# Patient Record
Sex: Female | Born: 1940
Health system: Southern US, Community
[De-identification: ages and names within clinical notes are randomized; demographics above are authoritative.]

## PROBLEM LIST (undated history)

## (undated) DIAGNOSIS — I493 Ventricular premature depolarization: Secondary | ICD-10-CM

## (undated) DIAGNOSIS — Z923 Personal history of irradiation: Secondary | ICD-10-CM

## (undated) DIAGNOSIS — R001 Bradycardia, unspecified: Secondary | ICD-10-CM

## (undated) DIAGNOSIS — H269 Unspecified cataract: Secondary | ICD-10-CM

## (undated) DIAGNOSIS — M1990 Unspecified osteoarthritis, unspecified site: Secondary | ICD-10-CM

## (undated) DIAGNOSIS — I10 Essential (primary) hypertension: Secondary | ICD-10-CM

## (undated) DIAGNOSIS — G4730 Sleep apnea, unspecified: Secondary | ICD-10-CM

## (undated) DIAGNOSIS — K219 Gastro-esophageal reflux disease without esophagitis: Secondary | ICD-10-CM

## (undated) DIAGNOSIS — K589 Irritable bowel syndrome without diarrhea: Secondary | ICD-10-CM

## (undated) DIAGNOSIS — Z8619 Personal history of other infectious and parasitic diseases: Secondary | ICD-10-CM

## (undated) DIAGNOSIS — C50919 Malignant neoplasm of unspecified site of unspecified female breast: Secondary | ICD-10-CM

## (undated) DIAGNOSIS — E785 Hyperlipidemia, unspecified: Secondary | ICD-10-CM

## (undated) HISTORY — DX: Essential (primary) hypertension: I10

## (undated) HISTORY — DX: Ventricular premature depolarization: I49.3

## (undated) HISTORY — DX: Personal history of irradiation: Z92.3

## (undated) HISTORY — PX: WISDOM TOOTH EXTRACTION: SHX21

## (undated) HISTORY — DX: Gastro-esophageal reflux disease without esophagitis: K21.9

## (undated) HISTORY — DX: Sleep apnea, unspecified: G47.30

## (undated) HISTORY — DX: Personal history of other infectious and parasitic diseases: Z86.19

## (undated) HISTORY — DX: Unspecified osteoarthritis, unspecified site: M19.90

## (undated) HISTORY — PX: KNEE ARTHROSCOPY: SHX127

## (undated) HISTORY — DX: Hyperlipidemia, unspecified: E78.5

## (undated) HISTORY — DX: Unspecified cataract: H26.9

## (undated) HISTORY — DX: Irritable bowel syndrome, unspecified: K58.9

## (undated) HISTORY — DX: Malignant neoplasm of unspecified site of unspecified female breast: C50.919

## (undated) HISTORY — DX: Bradycardia, unspecified: R00.1

## (undated) HISTORY — PX: COLONOSCOPY: SHX174

---

## 1995-10-13 HISTORY — PX: OTHER SURGICAL HISTORY: SHX169

## 1998-03-12 ENCOUNTER — Other Ambulatory Visit: Admission: RE | Admit: 1998-03-12 | Discharge: 1998-03-12 | Payer: Self-pay | Admitting: Obstetrics and Gynecology

## 2001-05-31 ENCOUNTER — Other Ambulatory Visit: Admission: RE | Admit: 2001-05-31 | Discharge: 2001-05-31 | Payer: Self-pay | Admitting: Family Medicine

## 2003-09-24 ENCOUNTER — Other Ambulatory Visit: Admission: RE | Admit: 2003-09-24 | Discharge: 2003-09-24 | Payer: Self-pay | Admitting: Family Medicine

## 2003-10-31 LAB — HM COLONOSCOPY

## 2004-11-26 ENCOUNTER — Ambulatory Visit: Payer: Self-pay | Admitting: Family Medicine

## 2004-12-14 ENCOUNTER — Ambulatory Visit (HOSPITAL_COMMUNITY): Admission: RE | Admit: 2004-12-14 | Discharge: 2004-12-14 | Payer: Self-pay | Admitting: Family Medicine

## 2004-12-17 ENCOUNTER — Ambulatory Visit: Payer: Self-pay | Admitting: Family Medicine

## 2004-12-25 ENCOUNTER — Ambulatory Visit: Payer: Self-pay | Admitting: Family Medicine

## 2005-01-28 ENCOUNTER — Ambulatory Visit: Payer: Self-pay | Admitting: Family Medicine

## 2005-03-04 ENCOUNTER — Ambulatory Visit: Payer: Self-pay | Admitting: Family Medicine

## 2005-04-29 ENCOUNTER — Ambulatory Visit: Payer: Self-pay | Admitting: Family Medicine

## 2005-06-17 ENCOUNTER — Ambulatory Visit: Payer: Self-pay | Admitting: Family Medicine

## 2005-08-17 ENCOUNTER — Ambulatory Visit: Payer: Self-pay | Admitting: Family Medicine

## 2005-12-04 ENCOUNTER — Ambulatory Visit: Payer: Self-pay | Admitting: Family Medicine

## 2006-08-30 ENCOUNTER — Encounter: Payer: Self-pay | Admitting: Family Medicine

## 2006-08-30 ENCOUNTER — Ambulatory Visit: Payer: Self-pay | Admitting: Family Medicine

## 2006-08-30 ENCOUNTER — Other Ambulatory Visit: Admission: RE | Admit: 2006-08-30 | Discharge: 2006-08-30 | Payer: Self-pay | Admitting: Family Medicine

## 2006-09-24 ENCOUNTER — Ambulatory Visit: Payer: Self-pay | Admitting: Family Medicine

## 2006-10-21 ENCOUNTER — Ambulatory Visit: Payer: Self-pay | Admitting: Family Medicine

## 2006-12-30 ENCOUNTER — Ambulatory Visit: Payer: Self-pay

## 2007-03-25 ENCOUNTER — Telehealth (INDEPENDENT_AMBULATORY_CARE_PROVIDER_SITE_OTHER): Payer: Self-pay | Admitting: *Deleted

## 2007-10-13 HISTORY — PX: CATARACT EXTRACTION: SUR2

## 2007-11-23 ENCOUNTER — Encounter: Payer: Self-pay | Admitting: Family Medicine

## 2007-11-23 DIAGNOSIS — I4949 Other premature depolarization: Secondary | ICD-10-CM | POA: Insufficient documentation

## 2007-11-23 DIAGNOSIS — K219 Gastro-esophageal reflux disease without esophagitis: Secondary | ICD-10-CM

## 2007-11-23 DIAGNOSIS — E785 Hyperlipidemia, unspecified: Secondary | ICD-10-CM

## 2007-11-23 DIAGNOSIS — K589 Irritable bowel syndrome without diarrhea: Secondary | ICD-10-CM

## 2007-11-23 DIAGNOSIS — I10 Essential (primary) hypertension: Secondary | ICD-10-CM

## 2007-12-01 ENCOUNTER — Ambulatory Visit: Payer: Self-pay | Admitting: Family Medicine

## 2007-12-01 DIAGNOSIS — E669 Obesity, unspecified: Secondary | ICD-10-CM

## 2007-12-05 LAB — CONVERTED CEMR LAB
ALT: 28 units/L (ref 0–35)
AST: 22 units/L (ref 0–37)
Albumin: 3.9 g/dL (ref 3.5–5.2)
Alkaline Phosphatase: 42 units/L (ref 39–117)
Basophils Absolute: 0 10*3/uL (ref 0.0–0.1)
Basophils Relative: 0.6 % (ref 0.0–1.0)
Bilirubin, Direct: 0.1 mg/dL (ref 0.0–0.3)
Creatinine, Ser: 1 mg/dL (ref 0.4–1.2)
Eosinophils Absolute: 0.3 10*3/uL (ref 0.0–0.6)
GFR calc Af Amer: 72 mL/min
GFR calc non Af Amer: 59 mL/min
HCT: 44 % (ref 36.0–46.0)
HDL: 34.6 mg/dL — ABNORMAL LOW (ref >39.0)
Hemoglobin: 14.1 g/dL (ref 12.0–15.0)
LDL Cholesterol: 135 mg/dL — ABNORMAL HIGH (ref 0–99)
Lymphocytes Relative: 27.5 % (ref 12.0–46.0)
Monocytes Absolute: 0.5 10*3/uL (ref 0.2–0.7)
Neutro Abs: 3.8 10*3/uL (ref 1.4–7.7)
Neutrophils Relative %: 59.6 % (ref 43.0–77.0)
Platelets: 346 10*3/uL (ref 150–400)
Potassium: 3.7 meq/L (ref 3.5–5.1)
RBC: 4.96 M/uL (ref 3.87–5.11)
TSH: 1.81 microintl units/mL (ref 0.35–5.50)
Total Bilirubin: 0.7 mg/dL (ref 0.3–1.2)
Triglycerides: 112 mg/dL (ref 0–149)
VLDL: 22 mg/dL (ref 0–40)
WBC: 6.4 10*3/uL (ref 4.5–10.5)

## 2007-12-06 ENCOUNTER — Encounter: Payer: Self-pay | Admitting: Family Medicine

## 2007-12-16 ENCOUNTER — Encounter (INDEPENDENT_AMBULATORY_CARE_PROVIDER_SITE_OTHER): Payer: Self-pay | Admitting: *Deleted

## 2007-12-20 ENCOUNTER — Ambulatory Visit: Payer: Self-pay | Admitting: Family Medicine

## 2007-12-20 LAB — FECAL OCCULT BLOOD, GUAIAC: Fecal Occult Blood: NEGATIVE

## 2007-12-21 ENCOUNTER — Encounter (INDEPENDENT_AMBULATORY_CARE_PROVIDER_SITE_OTHER): Payer: Self-pay | Admitting: *Deleted

## 2008-12-12 ENCOUNTER — Encounter: Payer: Self-pay | Admitting: Family Medicine

## 2008-12-14 ENCOUNTER — Telehealth: Payer: Self-pay | Admitting: Family Medicine

## 2008-12-17 ENCOUNTER — Encounter (INDEPENDENT_AMBULATORY_CARE_PROVIDER_SITE_OTHER): Payer: Self-pay | Admitting: *Deleted

## 2009-01-23 ENCOUNTER — Ambulatory Visit: Payer: Self-pay | Admitting: Family Medicine

## 2009-01-23 ENCOUNTER — Encounter: Payer: Self-pay | Admitting: Family Medicine

## 2009-01-23 ENCOUNTER — Other Ambulatory Visit: Admission: RE | Admit: 2009-01-23 | Discharge: 2009-01-23 | Payer: Self-pay | Admitting: Family Medicine

## 2009-01-23 DIAGNOSIS — J309 Allergic rhinitis, unspecified: Secondary | ICD-10-CM | POA: Insufficient documentation

## 2009-01-23 LAB — HM PAP SMEAR

## 2009-01-28 ENCOUNTER — Encounter: Payer: Self-pay | Admitting: Family Medicine

## 2009-02-05 ENCOUNTER — Ambulatory Visit: Payer: Self-pay | Admitting: Family Medicine

## 2009-02-07 LAB — CONVERTED CEMR LAB
AST: 23 units/L (ref 0–37)
Albumin: 3.9 g/dL (ref 3.5–5.2)
Alkaline Phosphatase: 42 units/L (ref 39–117)
Bilirubin, Direct: 0.1 mg/dL (ref 0.0–0.3)
Calcium: 9.6 mg/dL (ref 8.4–10.5)
Chloride: 105 meq/L (ref 96–112)
Creatinine, Ser: 0.9 mg/dL (ref 0.4–1.2)
Glucose, Bld: 89 mg/dL (ref 70–99)
Sodium: 143 meq/L (ref 135–145)
TSH: 2.13 microintl units/mL (ref 0.35–5.50)
Total CHOL/HDL Ratio: 5
Triglycerides: 108 mg/dL (ref 0.0–149.0)
VLDL: 21.6 mg/dL (ref 0.0–40.0)

## 2009-12-17 ENCOUNTER — Encounter: Payer: Self-pay | Admitting: Family Medicine

## 2009-12-25 ENCOUNTER — Encounter: Payer: Self-pay | Admitting: Family Medicine

## 2010-02-17 ENCOUNTER — Ambulatory Visit: Payer: Self-pay | Admitting: Family Medicine

## 2010-02-17 DIAGNOSIS — M25569 Pain in unspecified knee: Secondary | ICD-10-CM | POA: Insufficient documentation

## 2010-02-19 LAB — CONVERTED CEMR LAB
Basophils Absolute: 0 10*3/uL (ref 0.0–0.1)
Calcium: 9.3 mg/dL (ref 8.4–10.5)
HCT: 38.5 % (ref 36.0–46.0)
LDL Cholesterol: 119 mg/dL — ABNORMAL HIGH (ref 0–99)
Lymphocytes Relative: 33.8 % (ref 12.0–46.0)
MCV: 90.2 fL (ref 78.0–100.0)
Monocytes Absolute: 0.5 10*3/uL (ref 0.1–1.0)
Neutrophils Relative %: 54.3 % (ref 43.0–77.0)
Platelets: 284 10*3/uL (ref 150.0–400.0)
RBC: 4.26 M/uL (ref 3.87–5.11)
RDW: 13.6 % (ref 11.5–14.6)
Sodium: 140 meq/L (ref 135–145)
TSH: 1.16 microintl units/mL (ref 0.35–5.50)
Total CHOL/HDL Ratio: 4
VLDL: 29.8 mg/dL (ref 0.0–40.0)
WBC: 7.2 10*3/uL (ref 4.5–10.5)

## 2010-05-06 ENCOUNTER — Ambulatory Visit: Payer: Self-pay | Admitting: Family Medicine

## 2010-05-06 DIAGNOSIS — R1032 Left lower quadrant pain: Secondary | ICD-10-CM

## 2010-05-14 ENCOUNTER — Ambulatory Visit (HOSPITAL_BASED_OUTPATIENT_CLINIC_OR_DEPARTMENT_OTHER): Admission: RE | Admit: 2010-05-14 | Discharge: 2010-05-14 | Payer: Self-pay | Admitting: Orthopedic Surgery

## 2010-11-11 NOTE — Assessment & Plan Note (Signed)
Summary: CPX/DLO   Vital Signs:  Patient profile:   70 year old female Height:      66 inches Weight:      242.25 pounds BMI:     39.24 Temp:     97.5 degrees F oral Pulse rate:   68 / minute Pulse rhythm:   regular BP sitting:   122 / 68  (left arm) Cuff size:   regular  Vitals Entered By: Lewanda Rife LPN (Feb 17, 7845 2:36 PM) CC: CPX with breast exam LMP 1990   History of Present Illness: here for f/u of chronic med problems and to rev health mt list  feels good in general   has a painful knee  no arthritis  getting mri -- to see if any meniscal injury   wt is up 1 lb with bmi 39 is trying to eat healthier -- but no exercise in 2 mo due to knee  is going to try a bicycle    lipids due - LDL one teens last year, is on fish oil ate tuna for lunch today- wants to do labs   HTN in good control with bp 122/68  mam nl 3/11 self exam --no lumps or changes  fam hx b ca in mother  pap nl in 4/10  no gyn problems   colonosc nl in 05- due 10y no bowel changes at all   Td 03  had shingles and also vaccine   no pneumonia vaccine   dexa nl in 05 -- pt does not want to have another one  no lost ht   ca and D - very good about that and gets outdoors quite a bit   Allergies: 1)  Zestril  Past History:  Past Surgical History: Last updated: 01/23/2009 Left breast cyst (1995) Shingles left forehead and cornea Colonoscopy- neg (10/2003) Dexa- normal (01/2004) MRI/MRA no  aneurysm, some atherosclerosis post cereb artery (12/2004) Carotid dopplers 0-39 % ICA stenosis (12/2006) cataract surg bilat09   Family History: Last updated: Dec 07, 2007 Father: died age 45- cerebral hemorrhage- aneurysm Mother: died age 45- HTN, CAD, breast cancer Siblings: 2 brothers with HTN, 1 sister GM DM GM OP neice aneurysm in brain  brother subdural hematoma after a fall, and heart problems (? died of )   Social History: Last updated: 11/23/2007 Marital Status:  Married Children: 1 Occupation: retired  Risk Factors: Smoking Status: never (11/23/2007)  Past Medical History: GERD IBS hx of PVCs past hx of shingles  Hyperlipidemia Hypertension   derm- Dr Londell Moh  ortho - Dr Despina Hick   Review of Systems General:  Denies fatigue, fever, loss of appetite, and malaise. Eyes:  Denies blurring and eye irritation. CV:  Denies chest pain or discomfort, palpitations, shortness of breath with exertion, and swelling of feet. Resp:  Denies cough, shortness of breath, and wheezing. GI:  Denies abdominal pain, change in bowel habits, and indigestion. GU:  Denies abnormal vaginal bleeding, discharge, dysuria, hematuria, and urinary frequency. MS:  Complains of joint pain and stiffness; denies joint redness, joint swelling, and cramps. Derm:  Denies itching, lesion(s), poor wound healing, and rash. Neuro:  Denies numbness and tingling. Psych:  Denies anxiety and depression. Endo:  Denies cold intolerance, excessive thirst, excessive urination, and heat intolerance. Heme:  Denies abnormal bruising and bleeding.  Physical Exam  General:  obese and well appearing  Head:  normocephalic, atraumatic, and no abnormalities observed.   Eyes:  vision grossly intact, pupils equal, pupils round, and pupils reactive  to light.  no conjunctival pallor, injection or icterus  Ears:  R ear normal and L ear normal.  - scant cerumen Nose:  no nasal discharge.   Mouth:  pharynx pink and moist.   Neck:  supple with full rom and no masses or thyromegally, no JVD or carotid bruit  Chest Wall:  No deformities, masses, or tenderness noted. Breasts:  No mass, nodules, thickening, tenderness, bulging, retraction, inflamation, nipple discharge or skin changes noted.   Lungs:  Normal respiratory effort, chest expands symmetrically. Lungs are clear to auscultation, no crackles or wheezes. Heart:  Normal rate and regular rhythm. S1 and S2 normal without gallop, murmur, click, rub  or other extra sounds. Abdomen:  Bowel sounds positive,abdomen soft and non-tender without masses, organomegaly or hernias noted. no renal bruits Msk:  No deformity or scoliosis noted of thoracic or lumbar spine.   poor rom knee without crepitice Pulses:  R and L carotid,radial,femoral,dorsalis pedis and posterior tibial pulses are full and equal bilaterally Extremities:  No clubbing, cyanosis, edema, or deformity noted with normal full range of motion of all joints.   Neurologic:  sensation intact to light touch, gait normal, and DTRs symmetrical and normal.   Skin:  Intact without suspicious lesions or rashes lentigos and sks diffusely  Cervical Nodes:  No lymphadenopathy noted Axillary Nodes:  No palpable lymphadenopathy Inguinal Nodes:  No significant adenopathy Psych:  normal affect, talkative and pleasant -- cheerful   Impression & Recommendations:  Problem # 1:  NEOPLASM, MALIGNANT, BREAST, FAMILY HX, MOTHER (ICD-V16.3) Assessment Unchanged mam is up to date and normal  breast exam done today urged pt to keep up her self breast exams   Problem # 2:  HYPERTENSION (ICD-401.9) Assessment: Unchanged  bp remains in very good control with current meds lab today rev low sodium diet  want to work on low impact exercise  Her updated medication list for this problem includes:    Hydrochlorothiazide 25 Mg Tabs (Hydrochlorothiazide) .Marland Kitchen... Take one by mouth daily    Benicar 20 Mg Tabs (Olmesartan medoxomil) .Marland Kitchen... Take one by mouth daily  Orders: Venipuncture (16109) TLB-Lipid Panel (80061-LIPID) TLB-Renal Function Panel (80069-RENAL) TLB-CBC Platelet - w/Differential (85025-CBCD) TLB-TSH (Thyroid Stimulating Hormone) (84443-TSH) TLB-ALT (SGPT) (84460-ALT) TLB-AST (SGOT) (84450-SGOT)  BP today: 122/68 Prior BP: 118/70 (01/23/2009)  Labs Reviewed: K+: 3.9 (02/05/2009) Creat: : 0.9 (02/05/2009)   Chol: 179 (02/05/2009)   HDL: 38.80 (02/05/2009)   LDL: 119 (02/05/2009)   TG:  108.0 (02/05/2009)  Problem # 3:  HYPERLIPIDEMIA (ICD-272.4) check labs today rev low sat fat diet  has controlled with diet in the past Orders: Venipuncture (60454) TLB-Lipid Panel (80061-LIPID) TLB-Renal Function Panel (80069-RENAL) TLB-CBC Platelet - w/Differential (85025-CBCD) TLB-TSH (Thyroid Stimulating Hormone) (84443-TSH) TLB-ALT (SGPT) (84460-ALT) TLB-AST (SGOT) (84450-SGOT)  Problem # 4:  KNEE PAIN, ACUTE (ICD-719.46) Assessment: Comment Only with no OA seen  will f/u with Dr Despina Hick for MRI- ? meniscal injury  Her updated medication list for this problem includes:    Adult Aspirin Low Strength 81 Mg Tbdp (Aspirin) .Marland Kitchen... Take one by mouth daily  Problem # 5:  Preventive Health Care (ICD-V70.0) Assessment: Comment Only reviewed health mt list  pneumovax today  Complete Medication List: 1)  Hydrochlorothiazide 25 Mg Tabs (Hydrochlorothiazide) .... Take one by mouth daily 2)  Calcium 1500 Mg Tabs (Calcium carbonate) .... Take by mouth as directed 3)  Restasis 0.05 % Emul (Cyclosporine) .... Use gtts as directed daily 4)  Benicar 20 Mg Tabs (Olmesartan medoxomil) .Marland KitchenMarland KitchenMarland Kitchen  Take one by mouth daily 5)  Adult Aspirin Low Strength 81 Mg Tbdp (Aspirin) .... Take one by mouth daily 6)  Prilosec 20 Mg Cpdr (Omeprazole) .... Take one by mouth daily as needed 7)  Allegra 60 Mg Tabs (Fexofenadine hcl) .Marland Kitchen.. 1 by mouth two times a day prn 8)  Fish Oil Oil (Fish oil) .... 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, commercial) [id number])  Patient Instructions: 1)  labs today 2)  keep working on healthy diet and exercise for weight loss 3)  you can raise your HDL (good cholesterol) by increasing exercise and eating omega 3 fatty acid supplement like fish oil or flax seed oil over the counter 4)  you can lower LDL (bad cholesterol) by limiting saturated fats in diet like red meat, fried foods, egg yolks, fatty breakfast  meats, high fat dairy products and shellfish  5)  pneumonia vaccine today  Current Allergies (reviewed today): ZESTRIL    Pneumovax Vaccine    Vaccine Type: Pneumovax    Site: left deltoid    Mfr: Merck    Dose: 0.5 ml    Route: IM    Given by: Lewanda Rife LPN    Exp. Date: 08/06/2011    Lot #: [id number]    VIS given: 05/09/96 version given Feb 17, 2010.

## 2010-11-11 NOTE — Assessment & Plan Note (Signed)
Summary: stomach cramping/dlol   Vital Signs:  Patient profile:   70 year old female Height:      66 inches Weight:      235.50 pounds BMI:     38.15 Temp:     98.3 degrees F oral Pulse rate:   68 / minute Pulse rhythm:   regular BP sitting:   120 / 68  (left arm) Cuff size:   large  Vitals Entered By: Lewanda Rife LPN (May 06, 2010 3:22 PM) CC: Since 04/15/10 stomach cramping and indigestion that comes and goes.   History of Present Illness: symptoms started on july 1st- was at the beach had eaten tomato sandwhich and healthy choice cereal with nuts  that night ate salmon and salad , then strawberry ice cream  all the sudden stomach gave her sharp pains - rad across her back  epigastric and Lower left quadrant (? of diverticulosis)  next day spent in bed next day used maalox and zantac -- and that really helped   since then has avoided seeds and nuts   yesterday - went out for a birthday got a steak and cheese sub-- got indigestion like heartburn with pain across back much milder  did eat more than usual  stopped her prilosec -- 6 months ago - was doing well  last night she did take some maalox  has not had any zantac or prilosec   feels ok now   last colonosc was in 05  no hx of polyps  no ca in family       Allergies: 1)  Zestril  Past History:  Past Medical History: Last updated: 02/17/2010 GERD IBS hx of PVCs past hx of shingles  Hyperlipidemia Hypertension   derm- Dr Londell Moh  ortho - Dr Despina Hick   Past Surgical History: Last updated: 01/23/2009 Left breast cyst (1995) Shingles left forehead and cornea Colonoscopy- neg (10/2003) Dexa- normal (01/2004) MRI/MRA no  aneurysm, some atherosclerosis post cereb artery (12/2004) Carotid dopplers 0-39 % ICA stenosis (12/2006) cataract surg bilat09   Family History: Last updated: 2007-12-08 Father: died age 34- cerebral hemorrhage- aneurysm Mother: died age 61- HTN, CAD, breast  cancer Siblings: 2 brothers with HTN, 1 sister GM DM GM OP neice aneurysm in brain  brother subdural hematoma after a fall, and heart problems (? died of )   Social History: Last updated: 11/23/2007 Marital Status: Married Children: 1 Occupation: retired  Risk Factors: Smoking Status: never (11/23/2007)  Review of Systems General:  Denies chills, fatigue, fever, loss of appetite, and malaise. Eyes:  Denies blurring and eye irritation. CV:  Denies chest pain or discomfort, palpitations, and shortness of breath with exertion. Resp:  Denies cough and wheezing. GI:  Complains of abdominal pain and indigestion; denies bloody stools, change in bowel habits, vomiting, and vomiting blood. GU:  Denies dysuria and urinary frequency. Derm:  Denies lesion(s), poor wound healing, and rash. Neuro:  Denies numbness and tingling. Psych:  mood is ok . Endo:  Denies cold intolerance, excessive thirst, excessive urination, and heat intolerance. Heme:  Denies abnormal bruising and bleeding.  Physical Exam  General:  obese and well appearing  Head:  normocephalic, atraumatic, and no abnormalities observed.   Eyes:  vision grossly intact, pupils equal, pupils round, and pupils reactive to light.  no conjunctival pallor, injection or icterus  Mouth:  pharynx pink and moist.   Neck:  supple with full rom and no masses or thyromegally, no JVD or carotid bruit  Lungs:  Normal respiratory effort, chest expands symmetrically. Lungs are clear to auscultation, no crackles or wheezes. Heart:  Normal rate and regular rhythm. S1 and S2 normal without gallop, murmur, click, rub or other extra sounds. Abdomen:  mildly tender in epigastrium and LLQ without rebound or gaurding no R sided tenderness neg murphy sign  soft, normal bowel sounds, no distention, no masses, no hepatomegaly, and no splenomegaly.   Extremities:  No clubbing, cyanosis, edema, or deformity noted with normal full range of motion of all  joints.   Neurologic:  sensation intact to light touch, gait normal, and DTRs symmetrical and normal.   Skin:  Intact without suspicious lesions or rashes no pallor or jaundice  Cervical Nodes:  No lymphadenopathy noted Inguinal Nodes:  No significant adenopathy Psych:  normal affect, talkative and pleasant    Impression & Recommendations:  Problem # 1:  GERD (ICD-530.81) Assessment Deteriorated with what sounds like an episode of gastritis -- has been off ppi for 6 mo suspec this is acid problem - since it responded briefly to maalox given px for omeprazole - to update in 1 week if not resolved disc lifestyle change for reflux  if not imp consider lab and poss abd [place] adv if symptoms severe- seek care in ER  The following medications were removed from the medication list:    Prilosec 20 Mg Cpdr (Omeprazole) .Marland Kitchen... Take one by mouth daily as needed Her updated medication list for this problem includes:    Omeprazole 20 Mg Cpdr (Omeprazole) .Marland Kitchen... 1 by mouth once daily in ams  Problem # 2:  ABDOMINAL PAIN, LEFT LOWER QUADRANT (ICD-789.04) Assessment: New this was brief after eating cereal with nuts  suspect poss diverticulosis  no pain or fever now  disc starting daily fiber and watch diet for nuts and seeds  update if symptoms return last colonosc 05 normal  Complete Medication List: 1)  Hydrochlorothiazide 25 Mg Tabs (Hydrochlorothiazide) .... Take one by mouth daily 2)  Restasis 0.05 % Emul (Cyclosporine) .... Use gtts as directed daily 3)  Benicar 20 Mg Tabs (Olmesartan medoxomil) .... Take one by mouth daily 4)  Adult Aspirin Low Strength 81 Mg Tbdp (Aspirin) .... Take one by mouth daily 5)  Allegra 60 Mg Tabs (Fexofenadine hcl) .Marland Kitchen.. 1 by mouth two times a day prn 6)  Fish Oil Oil (Fish oil) .... Take two by mouth daily 7)  Calcium Carbonate-vitamin D 600-400 Mg-unit Tabs (Calcium carbonate-vitamin d) .... One tab by mouth twice a day 8)  Omeprazole 20 Mg Cpdr (Omeprazole)  .Marland Kitchen.. 1 by mouth once daily in ams  Patient Instructions: 1)  start on omeprazole (generic prilosec) 20 mg each am  2)  start first dose now, however 3)  start citrucel fiber supplement mixed with water once daily  4)  if your symptoms are not resolved in 1 week - call  5)  that includes abdominal pain/ back pain or heartburn 6)  stay away from heavy / fatty foods/spicy foods / caffiene and don't eat late 7)  also eat small portions 8)  please update me if symptoms worsen  Prescriptions: OMEPRAZOLE 20 MG CPDR (OMEPRAZOLE) 1 by mouth once daily in ams  #30 x 11   Entered and Authorized by:   Judith Part MD   Signed by:   Judith Part MD on 05/06/2010   Method used:   Electronically to        [facility]* (retail)       [id number]       [place], [place]  [address]       Ph: [phone]       Fax: [phone]   RxID:   [id number]   Current Allergies (reviewed today): ZESTRIL

## 2010-11-11 NOTE — Letter (Signed)
Summary: Results Follow up Letter  Miranda Mueller at [facility]  [address] [place], [place] [address]   Phone: [phone]  Fax: [phone]    12/25/2009 MRN: [id number]  [facility] [address] [place], [place]  [address]  Dear Miranda Mueller,  The following are the results of your recent test(s):  Test         Result    Pap Smear:        Normal _____  Not Normal _____ Comments: ______________________________________________________ Cholesterol: LDL(Bad cholesterol):         Your goal is less than:         HDL (Good cholesterol):       Your goal is more than: Comments:  ______________________________________________________ Mammogram:        Normal __X___  Not Normal _____ Comments: Please repeat in one year.  ___________________________________________________________________ Hemoccult:        Normal _____  Not normal _______ Comments:    _____________________________________________________________________ Other Tests:    We routinely do not discuss normal results over the telephone.  If you desire a copy of the results, or you have any questions about this information we can discuss them at your next office visit.   Sincerely,       Roxy Manns, MD

## 2010-12-17 ENCOUNTER — Encounter: Payer: Self-pay | Admitting: Family Medicine

## 2010-12-17 LAB — HM MAMMOGRAPHY: HM Mammogram: NORMAL

## 2010-12-22 ENCOUNTER — Encounter (INDEPENDENT_AMBULATORY_CARE_PROVIDER_SITE_OTHER): Payer: Self-pay | Admitting: *Deleted

## 2010-12-26 LAB — POCT I-STAT 4, (NA,K, GLUC, HGB,HCT)
Glucose, Bld: 103 mg/dL — ABNORMAL HIGH (ref 70–99)
HCT: 40 % (ref 36.0–46.0)
Hemoglobin: 13.6 g/dL (ref 12.0–15.0)
Potassium: 3.7 mEq/L (ref 3.5–5.1)
Sodium: 141 mEq/L (ref 135–145)

## 2010-12-30 NOTE — Miscellaneous (Signed)
Summary: Mammogram to flowsheet  Clinical Lists Changes  Observations: Added new observation of MAMMO DUE: 12/2011 (12/22/2010 13:32) Added new observation of MAMMOGRAM: normal (12/17/2010 13:32)      Preventive Care Screening  Mammogram:    Date:  12/17/2010    Next Due:  12/2011    Results:  normal

## 2010-12-30 NOTE — Letter (Signed)
Summary: Results Follow up Letter  [place] at [facility]  [address] [place], [place] [address]   Phone: [phone]  Fax: [phone]    12/22/2010 MRN: [id number]     Miranda Mueller [address] [place], [place]  [address]       Dear Miranda Mueller,  The following are the results of your recent test(s):  Test         Result    Pap Smear:        Normal _____  Not Normal _____ Comments: ______________________________________________________ Cholesterol: LDL(Bad cholesterol):         Your goal is less than:         HDL (Good cholesterol):       Your goal is more than: Comments:  ______________________________________________________ Mammogram:        Normal __X___  Not Normal _____ Comments: Repeat in 1 year  ___________________________________________________________________ Hemoccult:        Normal _____  Not normal _______ Comments:    _____________________________________________________________________ Other Tests:    We routinely do not discuss normal results over the telephone.  If you desire a copy of the results, or you have any questions about this information we can discuss them at your next office visit.   Sincerely,      Sharilyn Sites for Dr. Roxy Manns

## 2011-07-01 ENCOUNTER — Other Ambulatory Visit: Payer: Self-pay | Admitting: Family Medicine

## 2011-07-01 NOTE — Telephone Encounter (Signed)
[facility] electronically request refill Benicar 20 mg and HCTZ 25 mg. #90 each x 0 refill with note pt needs to call for appt.

## 2011-09-28 ENCOUNTER — Other Ambulatory Visit: Payer: Self-pay | Admitting: Family Medicine

## 2011-10-02 NOTE — Telephone Encounter (Signed)
Refill request from [facility] for Benicar 20 mg and HCTZ 25 mg. Both last filled 07/03/11. It looks like pt last seen 04/2610 and CPX on 02/17/10 in [facility]. No  Upcoming appt scheduled at this time.Please advise.

## 2011-10-04 NOTE — Telephone Encounter (Signed)
Please call her and make a f/u appt this winter  Then send this back to me and I will refil  Thanks

## 2011-10-05 NOTE — Telephone Encounter (Signed)
Patient notified as instructed by telephone. Pt scheduled f/u 10/19/10 at 2pm and will schedule CPX when comes in for 01/08 appt.

## 2011-10-05 NOTE — Telephone Encounter (Signed)
Will refill electronically  

## 2011-10-20 ENCOUNTER — Ambulatory Visit (INDEPENDENT_AMBULATORY_CARE_PROVIDER_SITE_OTHER): Payer: Medicare Other | Admitting: Family Medicine

## 2011-10-20 ENCOUNTER — Encounter: Payer: Self-pay | Admitting: Family Medicine

## 2011-10-20 VITALS — BP 128/74 | HR 64 | Temp 98.2°F | Ht 66.0 in | Wt 253.2 lb

## 2011-10-20 DIAGNOSIS — I10 Essential (primary) hypertension: Secondary | ICD-10-CM | POA: Diagnosis not present

## 2011-10-20 DIAGNOSIS — E785 Hyperlipidemia, unspecified: Secondary | ICD-10-CM | POA: Diagnosis not present

## 2011-10-20 DIAGNOSIS — K219 Gastro-esophageal reflux disease without esophagitis: Secondary | ICD-10-CM

## 2011-10-20 LAB — COMPREHENSIVE METABOLIC PANEL
Alkaline Phosphatase: 43 U/L (ref 39–117)
Calcium: 9.4 mg/dL (ref 8.4–10.5)
Chloride: 103 mEq/L (ref 96–112)

## 2011-10-20 LAB — LIPID PANEL
Total CHOL/HDL Ratio: 5
Triglycerides: 138 mg/dL (ref 0.0–149.0)
VLDL: 27.6 mg/dL (ref 0.0–40.0)

## 2011-10-20 MED ORDER — OLMESARTAN MEDOXOMIL 20 MG PO TABS: 20.0000 mg | ORAL_TABLET | Freq: Every day | ORAL | Status: DC

## 2011-10-20 MED ORDER — HYDROCHLOROTHIAZIDE 25 MG PO TABS: 25.0000 mg | ORAL_TABLET | Freq: Every day | ORAL | Status: DC

## 2011-10-20 MED ORDER — OMEPRAZOLE 20 MG PO CPDR: 20.0000 mg | DELAYED_RELEASE_CAPSULE | Freq: Every day | ORAL | Status: DC

## 2011-10-20 NOTE — Progress Notes (Signed)
Subjective:    Patient ID: Miranda Mueller, female    DOB: Feb 11, 1941, 71 y.o.   MRN: [id number]  HPI Here for f/u of HTN and lipids and GERd Is feeling good   Has had quite a bit of weight gain - no new meds that cause wt gain  Exercises 3-4 days per week 45-1 hour-- pretty good  Eating may have changed -- healthier foods but perhaps portions are bigger Does eat a lot of grilled chicken salad   - 1 tbsp of ranch dressing  Does drink some sweet tea - had previously cut out  bp is   128/74  Today Very good control on benicar and hct  Wt is up 18 lb with bmi of 40 No cp or palpitations or headaches or edema  No side effects to medicines   Tried wt watchers in past -- would consider going back to that     Chemistry      Component Value Date/Time   NA 141 05/14/2010 0938   K 3.7 05/14/2010 0938   CL 101 02/17/2010 1454   CO2 29 02/17/2010 1454   BUN 14 02/17/2010 1454   CREATININE 1.1 02/17/2010 1454      Component Value Date/Time   CALCIUM 9.3 02/17/2010 1454   ALKPHOS 42 02/05/2009 0902   AST 19 02/17/2010 1454   ALT 22 02/17/2010 1454   BILITOT 1.0 02/05/2009 0902      Hx of hyperlipidemia  Lab Results  Component Value Date   CHOL 193 02/17/2010   HDL 44.10 02/17/2010   LDLCALC 119* 02/17/2010   TRIG 149.0 02/17/2010   CHOLHDL 4 02/17/2010   not too bad at that time  Diet-- no fatty foods except for the ranch dressing  Exercise- is good   Takes omeprazole for gerd  Symptoms are in good control when she is on it  Ran out - and tried zantac -- and that is not as good - breakthru symptoms- heartburn and acid in mouth  Patient Active Problem List  Diagnoses  . HYPERLIPIDEMIA  . OBESITY  . HYPERTENSION  . PREMATURE VENTRICULAR CONTRACTIONS  . ALLERGIC RHINITIS  . GERD  . IBS  . KNEE PAIN, ACUTE  . ABDOMINAL PAIN, LEFT LOWER QUADRANT   Past Medical History  Diagnosis Date  . GERD (gastroesophageal reflux disease)   . IBS (irritable bowel syndrome)   . PVC (premature ventricular  contraction)     Hx of  . History of shingles   . Hyperlipidemia   . Hypertension    Past Surgical History  Procedure Date  . Cataract extraction 2009    bilateral  . Breast surgery     ? left breast cyst   History  Substance Use Topics  . Smoking status: Never Smoker   . Smokeless tobacco: Not on file  . Alcohol Use: Not on file   Family History  Problem Relation Age of Onset  . Hypertension Mother   . Heart disease Mother     CAD  . Cancer Mother     breast CA  . Hypertension Sister   . Hypertension Brother   . Hypertension Brother    Allergies  Allergen Reactions  . Lisinopril     REACTION: cough   Current Outpatient Prescriptions on File Prior to Visit  Medication Sig Dispense Refill  . BENICAR 20 MG tablet TAKE 1 TABLET DAILY (NEED APPOINTMENT)  90 tablet  0  . hydrochlorothiazide (HYDRODIURIL) 25 MG tablet TAKE 1 TABLET  DAILY (NEED APPOINTMENT)  90 tablet  0        Review of Systems Review of Systems  Constitutional: Negative for fever, appetite change, fatigue and unexpected weight change.  Eyes: Negative for pain and visual disturbance.  Respiratory: Negative for sob, pos for cough occ with clear phlegm Cardiovascular: Negative for cp or palpitations    Gastrointestinal: Negative for nausea, diarrhea and constipation pos for heartburn/ indigestion/ throat clearing .  Genitourinary: Negative for urgency and frequency.  Skin: Negative for pallor or rash   Neurological: Negative for weakness, light-headedness, numbness and headaches.  Hematological: Negative for adenopathy. Does not bruise/bleed easily.  Psychiatric/Behavioral: Negative for dysphoric mood. The patient is not nervous/anxious.          Objective:   Physical Exam  Constitutional: She appears well-developed and well-nourished.       Obese and well appearing    HENT:  Head: Normocephalic and atraumatic.  Mouth/Throat: Oropharynx is clear and moist.  Eyes: Conjunctivae and EOM are  normal. Pupils are equal, round, and reactive to light. No scleral icterus.  Neck: Normal range of motion. Neck supple. No JVD present. Carotid bruit is not present. No thyromegaly present.  Cardiovascular: Normal rate, regular rhythm, normal heart sounds and intact distal pulses.  Exam reveals no gallop.   Pulmonary/Chest: Effort normal and breath sounds normal. No respiratory distress. She has no wheezes.  Abdominal: Soft. Bowel sounds are normal. She exhibits no distension, no abdominal bruit and no mass. There is no tenderness.  Musculoskeletal: Normal range of motion. She exhibits no edema and no tenderness.  Lymphadenopathy:    She has no cervical adenopathy.  Neurological: She is alert. She has normal reflexes. No cranial nerve deficit. She exhibits normal muscle tone. Coordination normal.  Skin: Skin is warm and dry. No rash noted. No pallor.  Psychiatric: She has a normal mood and affect.          Assessment & Plan:

## 2011-10-20 NOTE — Assessment & Plan Note (Signed)
Despite wt gain- in good control with benicar and hct Lab today  Rev plan for healthy diet and exercise F/u 6 mo for PE

## 2011-10-20 NOTE — Patient Instructions (Signed)
Work on weight loss Change to low cal salad dressing and also quit sugar beverages of any kind Increase exercise to 5 days per week if you can  I sent your px to [facility] today  Schedule PE with lab prior in about 6 months

## 2011-10-20 NOTE — Assessment & Plan Note (Signed)
Worse off PPI with cough and heartburn Refilled omeprazole Disc diet  Enc wt loss- made plan for diet and exercise F/u 6 mo

## 2011-10-20 NOTE — Assessment & Plan Note (Signed)
Lab today  Rev last lipids Disc goal for LDL  Rev low sat fat diet

## 2011-10-21 LAB — LDL CHOLESTEROL, DIRECT: Direct LDL: 140.7 mg/dL

## 2011-12-21 ENCOUNTER — Encounter: Payer: Self-pay | Admitting: Family Medicine

## 2011-12-21 DIAGNOSIS — Z1231 Encounter for screening mammogram for malignant neoplasm of breast: Secondary | ICD-10-CM | POA: Diagnosis not present

## 2011-12-24 ENCOUNTER — Encounter: Payer: Self-pay | Admitting: *Deleted

## 2012-04-03 ENCOUNTER — Telehealth: Payer: Self-pay | Admitting: Family Medicine

## 2012-04-03 DIAGNOSIS — K219 Gastro-esophageal reflux disease without esophagitis: Secondary | ICD-10-CM

## 2012-04-03 DIAGNOSIS — I10 Essential (primary) hypertension: Secondary | ICD-10-CM

## 2012-04-03 DIAGNOSIS — E785 Hyperlipidemia, unspecified: Secondary | ICD-10-CM

## 2012-04-03 NOTE — Telephone Encounter (Signed)
Message copied by Judy Pimple on Sun Apr 03, 2012  4:38 PM ------      Message from: Miranda Mueller      Created: Thu Mar 31, 2012  8:17 AM      Regarding: Cpx labs Tues 6/25       Please order  future cpx labs for pt's upcomming lab appt.      Thanks      Rodney Booze

## 2012-04-06 DIAGNOSIS — H26499 Other secondary cataract, unspecified eye: Secondary | ICD-10-CM | POA: Diagnosis not present

## 2012-04-07 ENCOUNTER — Other Ambulatory Visit (INDEPENDENT_AMBULATORY_CARE_PROVIDER_SITE_OTHER): Payer: Medicare Other

## 2012-04-07 DIAGNOSIS — I10 Essential (primary) hypertension: Secondary | ICD-10-CM | POA: Diagnosis not present

## 2012-04-07 DIAGNOSIS — K219 Gastro-esophageal reflux disease without esophagitis: Secondary | ICD-10-CM | POA: Diagnosis not present

## 2012-04-07 DIAGNOSIS — E785 Hyperlipidemia, unspecified: Secondary | ICD-10-CM | POA: Diagnosis not present

## 2012-04-07 LAB — COMPREHENSIVE METABOLIC PANEL
AST: 22 U/L (ref 0–37)
Albumin: 3.9 g/dL (ref 3.5–5.2)
Alkaline Phosphatase: 41 U/L (ref 39–117)
BUN: 14 mg/dL (ref 6–23)
CO2: 27 mEq/L (ref 19–32)
Chloride: 103 mEq/L (ref 96–112)
Total Bilirubin: 0.7 mg/dL (ref 0.3–1.2)
Total Protein: 7.4 g/dL (ref 6.0–8.3)

## 2012-04-07 LAB — CBC WITH DIFFERENTIAL/PLATELET
Eosinophils Absolute: 0.3 10*3/uL (ref 0.0–0.7)
Eosinophils Relative: 4.9 % (ref 0.0–5.0)
HCT: 43.2 % (ref 36.0–46.0)
Hemoglobin: 14.4 g/dL (ref 12.0–15.0)
Lymphocytes Relative: 39.3 % (ref 12.0–46.0)
MCHC: 33.3 g/dL (ref 30.0–36.0)
MCV: 91.1 fl (ref 78.0–100.0)
Monocytes Relative: 7.7 % (ref 3.0–12.0)
Platelets: 286 10*3/uL (ref 150.0–400.0)
RBC: 4.75 Mil/uL (ref 3.87–5.11)
RDW: 13.3 % (ref 11.5–14.6)

## 2012-04-07 LAB — LIPID PANEL: Total CHOL/HDL Ratio: 6

## 2012-04-07 LAB — TSH: TSH: 2.53 u[IU]/mL (ref 0.35–5.50)

## 2012-04-07 LAB — LDL CHOLESTEROL, DIRECT: Direct LDL: 151.9 mg/dL

## 2012-04-19 ENCOUNTER — Other Ambulatory Visit (HOSPITAL_COMMUNITY)
Admission: RE | Admit: 2012-04-19 | Discharge: 2012-04-19 | Disposition: A | Discharge status: Home / Self Care | Payer: Medicare Other | Source: Ambulatory Visit | Attending: Family Medicine | Admitting: Family Medicine

## 2012-04-19 ENCOUNTER — Encounter: Payer: Self-pay | Admitting: Family Medicine

## 2012-04-19 ENCOUNTER — Telehealth: Payer: Self-pay | Admitting: Family Medicine

## 2012-04-19 ENCOUNTER — Ambulatory Visit (INDEPENDENT_AMBULATORY_CARE_PROVIDER_SITE_OTHER): Payer: Medicare Other | Admitting: Family Medicine

## 2012-04-19 VITALS — BP 130/70 | HR 74 | Temp 97.6°F | Ht 66.0 in | Wt 245.0 lb

## 2012-04-19 DIAGNOSIS — Z01419 Encounter for gynecological examination (general) (routine) without abnormal findings: Secondary | ICD-10-CM | POA: Diagnosis not present

## 2012-04-19 DIAGNOSIS — E669 Obesity, unspecified: Secondary | ICD-10-CM

## 2012-04-19 DIAGNOSIS — Z124 Encounter for screening for malignant neoplasm of cervix: Secondary | ICD-10-CM | POA: Diagnosis not present

## 2012-04-19 DIAGNOSIS — Z23 Encounter for immunization: Secondary | ICD-10-CM | POA: Diagnosis not present

## 2012-04-19 DIAGNOSIS — E785 Hyperlipidemia, unspecified: Secondary | ICD-10-CM

## 2012-04-19 DIAGNOSIS — I10 Essential (primary) hypertension: Secondary | ICD-10-CM | POA: Diagnosis not present

## 2012-04-19 NOTE — Assessment & Plan Note (Signed)
Exam today If nl - no further paps at her age

## 2012-04-19 NOTE — Assessment & Plan Note (Signed)
Commended on wt loss so far Discussed how this problem influences overall health and the risks it imposes  Reviewed plan for weight loss with lower calorie diet (via better food choices and also portion control or program like weight watchers) and exercise building up to or more than 30 minutes 5 days per week including some aerobic activity    

## 2012-04-19 NOTE — Telephone Encounter (Signed)
Requesting lab results discussed in office visit today be mailed to her home at [address] [place], [place] [address].  Patient can be called back at [phone]

## 2012-04-19 NOTE — Patient Instructions (Addendum)
Cholesterol is high- this could be genetic Avoid red meat/ fried foods/ egg yolks/ fatty breakfast meats/ butter, cheese and high fat dairy/ and shellfish   Schedule fasting lab and follow up in 3 months to re check this and see if you need medication Tetanus shot today Pap smear today

## 2012-04-19 NOTE — Assessment & Plan Note (Signed)
This is up  ? Genetic as diet is better Disc goals for lipids and reasons to control them Rev labs with pt Rev low sat fat diet in detail  After diet effort will re check in 3 mo and consider statin if needed

## 2012-04-19 NOTE — Progress Notes (Signed)
Subjective:    Patient ID: Miranda Mueller, female    DOB: 05-01-1941, 71 y.o.   MRN: [id number]  HPI Here for check up of chronic medical conditions and to review health mt list    bp is  Good    Today BP Readings from Last 3 Encounters:  04/19/12 130/70  10/20/11 128/74  05/06/10 120/68     Chemistry      Component Value Date/Time   NA 139 04/07/2012 0817   K 3.6 04/07/2012 0817   CL 103 04/07/2012 0817   CO2 27 04/07/2012 0817   BUN 14 04/07/2012 0817   CREATININE 1.1 04/07/2012 0817      Component Value Date/Time   CALCIUM 9.4 04/07/2012 0817   ALKPHOS 41 04/07/2012 0817   AST 22 04/07/2012 0817   ALT 29 04/07/2012 0817   BILITOT 0.7 04/07/2012 0817     Lab Results  Component Value Date   WBC 6.8 04/07/2012   HGB 14.4 04/07/2012   HCT 43.2 04/07/2012   MCV 91.1 04/07/2012   PLT 286.0 04/07/2012    Lab Results  Component Value Date   TSH 2.53 04/07/2012     No cp or palpitations or headaches or edema  No side effects to medicines    Wt is down 8 lb with bmi of 39 Diet- has cut tea down to 1 glass a day No more night time eating , no ice cream , also cutting portions  Exercise- is [facility] [employer] - treadmill and stationary bike and weights   Lipids are up  Lab Results  Component Value Date   CHOL 230* 04/07/2012   CHOL 215* 10/20/2011   CHOL 193 02/17/2010   Lab Results  Component Value Date   HDL 41.10 04/07/2012   HDL 86.57 10/20/2011   HDL 44.10 02/17/2010   Lab Results  Component Value Date   LDLCALC 119* 02/17/2010   LDLCALC 119* 02/05/2009   LDLCALC 135* 12/01/2007   Lab Results  Component Value Date   TRIG 159.0* 04/07/2012   TRIG 138.0 10/20/2011   TRIG 149.0 02/17/2010   Lab Results  Component Value Date   CHOLHDL 6 04/07/2012   CHOLHDL 5 10/20/2011   CHOLHDL 4 02/17/2010   Lab Results  Component Value Date   LDLDIRECT 151.9 04/07/2012   LDLDIRECT 140.7 10/20/2011   diet-is overall better - puzzling Mother and sister have high chol  She does eat occ  bacon -weekends , no red meat/ occ fried foods (not often)  She declines chol med at this time    Due for Td- she does not want Tdap   mammo 3/13- ok  Self exam -no lumps or changes   colonosc 05-- normal , 10 year recall  Pap 4/10-- never had hysterect  No gyn problems   dexa nl in 05 Ca and D-- is fair about that - she stopped that after reading about ca   Patient Active Problem List  Diagnosis  . HYPERLIPIDEMIA  . OBESITY  . HYPERTENSION  . PREMATURE VENTRICULAR CONTRACTIONS  . ALLERGIC RHINITIS  . GERD  . IBS  . Routine gynecological examination   Past Medical History  Diagnosis Date  . GERD (gastroesophageal reflux disease)   . IBS (irritable bowel syndrome)   . PVC (premature ventricular contraction)     Hx of  . History of shingles   . Hyperlipidemia   . Hypertension    Past Surgical History  Procedure Date  .  Cataract extraction 2009    bilateral  . Breast surgery     ? left breast cyst   History  Substance Use Topics  . Smoking status: Never Smoker   . Smokeless tobacco: Not on file  . Alcohol Use: No   Family History  Problem Relation Age of Onset  . Hypertension Mother   . Heart disease Mother     CAD  . Cancer Mother     breast CA  . Hypertension Sister   . Hypertension Brother   . Hypertension Brother    Allergies  Allergen Reactions  . Lisinopril     REACTION: cough   Current Outpatient Prescriptions on File Prior to Visit  Medication Sig Dispense Refill  . aspirin 81 MG tablet Take 160 mg by mouth daily.        . cycloSPORINE (RESTASIS) 0.05 % ophthalmic emulsion Place 1 drop into both eyes 2 (two) times daily.        . hydrochlorothiazide (HYDRODIURIL) 25 MG tablet Take 1 tablet (25 mg total) by mouth daily.  90 tablet  3  . olmesartan (BENICAR) 20 MG tablet Take 1 tablet (20 mg total) by mouth daily.  90 tablet  3  . omeprazole (PRILOSEC) 20 MG capsule Take 1 capsule (20 mg total) by mouth daily.  90 capsule  3  . Potassium 99  MG TABS Take 1 tablet by mouth daily.        . Calcium Carbonate-Vit D-Min 600-400 MG-UNIT TABS Take 1 tablet by mouth 2 (two) times daily.        . Omega-3 Fatty Acids (FISH OIL PO) Take 2 capsules by mouth daily.              Review of Systems Review of Systems  Constitutional: Negative for fever, appetite change, fatigue and unexpected weight change.  Eyes: Negative for pain and visual disturbance.  Respiratory: Negative for cough and shortness of breath.   Cardiovascular: Negative for cp or palpitations    Gastrointestinal: Negative for nausea, diarrhea and constipation. occ heartburn if she eats the wrong foods like peppers Genitourinary: Negative for urgency and frequency.  Skin: Negative for pallor or rash   Neurological: Negative for weakness, light-headedness, numbness and headaches.  Hematological: Negative for adenopathy. Does not bruise/bleed easily.  Psychiatric/Behavioral: Negative for dysphoric mood. The patient is not nervous/anxious.         Objective:   Physical Exam  Constitutional: She appears well-developed and well-nourished. No distress.       Obese and well appearing   HENT:  Head: Normocephalic and atraumatic.  Right Ear: External ear normal.  Left Ear: External ear normal.  Nose: Nose normal.  Mouth/Throat: Oropharynx is clear and moist.       Scant cerumen bilat   Eyes: Conjunctivae and EOM are normal. Pupils are equal, round, and reactive to light. No scleral icterus.  Neck: Normal range of motion. Neck supple. No JVD present. Carotid bruit is not present. No thyromegaly present.  Cardiovascular: Normal rate, regular rhythm and normal heart sounds.  Exam reveals no gallop.   Pulmonary/Chest: Effort normal and breath sounds normal. No respiratory distress. She has no wheezes.  Abdominal: Soft. Bowel sounds are normal. She exhibits no distension, no abdominal bruit and no mass. There is no tenderness.  Genitourinary: No breast swelling, tenderness,  discharge or bleeding. There is no rash, tenderness or lesion on the right labia. There is no rash, tenderness or lesion on the left labia. Uterus  is not enlarged. Cervix exhibits no motion tenderness, no discharge and no friability. Right adnexum displays no mass, no tenderness and no fullness. Left adnexum displays no mass, no tenderness and no fullness. No tenderness around the vagina. No vaginal discharge found.       Breast exam: No mass, nodules, thickening, tenderness, bulging, retraction, inflamation, nipple discharge or skin changes noted.  No axillary or clavicular LA.  Chaperoned exam.    o External genitalia - nl appearing  o Urethral meatus nl appearing  o Urethra  -not hypermobile  o Bladder -undistended  o Vagina slightly atrophic mucosa/ no lesion so or bleeding or discharge o Cervix  Anterior and nl appearing o Uterus  Not enl or tender  o Adnexa/parametria -no M noted  o Anus and perineum-nl appearing    Musculoskeletal: Normal range of motion. She exhibits no edema and no tenderness.  Lymphadenopathy:    She has no cervical adenopathy.  Neurological: She is alert. She has normal reflexes. No cranial nerve deficit. She exhibits normal muscle tone. Coordination normal.  Skin: Skin is warm and dry. No rash noted. No erythema. No pallor.  Psychiatric: She has a normal mood and affect.          Assessment & Plan:

## 2012-04-19 NOTE — Assessment & Plan Note (Signed)
bp in fair control at this time  No changes needed  Disc lifstyle change with low sodium diet and exercise   Reviewed labs  

## 2012-04-25 DIAGNOSIS — J209 Acute bronchitis, unspecified: Secondary | ICD-10-CM | POA: Diagnosis not present

## 2012-04-26 ENCOUNTER — Encounter: Payer: Self-pay | Admitting: *Deleted

## 2012-04-26 NOTE — Telephone Encounter (Signed)
Done

## 2012-05-27 DIAGNOSIS — L821 Other seborrheic keratosis: Secondary | ICD-10-CM | POA: Diagnosis not present

## 2012-05-27 DIAGNOSIS — L259 Unspecified contact dermatitis, unspecified cause: Secondary | ICD-10-CM | POA: Diagnosis not present

## 2012-07-18 ENCOUNTER — Other Ambulatory Visit (INDEPENDENT_AMBULATORY_CARE_PROVIDER_SITE_OTHER): Payer: Medicare Other

## 2012-07-18 DIAGNOSIS — E785 Hyperlipidemia, unspecified: Secondary | ICD-10-CM | POA: Diagnosis not present

## 2012-07-18 LAB — LIPID PANEL
HDL: 38.8 mg/dL — ABNORMAL LOW (ref >39.00)
Triglycerides: 122 mg/dL (ref 0.0–149.0)
VLDL: 24.4 mg/dL (ref 0.0–40.0)

## 2012-07-18 LAB — AST: AST: 21 U/L (ref 0–37)

## 2012-07-25 ENCOUNTER — Encounter: Payer: Self-pay | Admitting: Family Medicine

## 2012-07-25 ENCOUNTER — Ambulatory Visit (INDEPENDENT_AMBULATORY_CARE_PROVIDER_SITE_OTHER): Payer: Medicare Other | Admitting: Family Medicine

## 2012-07-25 VITALS — BP 128/74 | HR 56 | Temp 98.2°F | Ht 66.0 in | Wt 251.0 lb

## 2012-07-25 DIAGNOSIS — E669 Obesity, unspecified: Secondary | ICD-10-CM

## 2012-07-25 DIAGNOSIS — E785 Hyperlipidemia, unspecified: Secondary | ICD-10-CM

## 2012-07-25 DIAGNOSIS — Z23 Encounter for immunization: Secondary | ICD-10-CM | POA: Diagnosis not present

## 2012-07-25 DIAGNOSIS — I10 Essential (primary) hypertension: Secondary | ICD-10-CM | POA: Diagnosis not present

## 2012-07-25 NOTE — Assessment & Plan Note (Signed)
bp in fair control at this time  No changes needed  Disc lifstyle change with low sodium diet and exercise  Enc continued exercise

## 2012-07-25 NOTE — Assessment & Plan Note (Signed)
Discussed how this problem influences overall health and the risks it imposes  Reviewed plan for weight loss with lower calorie diet (via better food choices and also portion control or program like weight watchers) and exercise building up to or more than 30 minutes 5 days per week including some aerobic activity   she has addiction to sweet tea- disc need to get rid of that

## 2012-07-25 NOTE — Patient Instructions (Addendum)
Please work on weight loss - and quit the sugar drinks Keep exercising Cholesterol is still too high - Avoid red meat/ fried foods/ egg yolks/ fatty breakfast meats/ butter, cheese and high fat dairy/ and shellfish   Flu shot today  Follow up in 6 mo for annual exam with labs prior

## 2012-07-25 NOTE — Progress Notes (Signed)
Subjective:    Patient ID: Miranda Mueller, female    DOB: 03/02/1941, 71 y.o.   MRN: [id number]  HPI Here for f/u of HTN and hyperlipidemia Feeling great overall   Wanted to mention - she had bronchitis in July - tx at [facility] with zpak and also tessalon  Still a little cough - yellow mucous   Obesity- gained 6 lb with bmi of 40 Had been loosing - then went back to sweet tea  Knows she needs to quit that  Is exercising Thinks her diet is overall ok  Health habits  bp is stable today  No cp or palpitations or headaches or edema  No side effects to medicines  BP Readings from Last 3 Encounters:  07/25/12 128/74  04/19/12 130/70  10/20/11 128/74     Lipids- pt declines medication Lab Results  Component Value Date   CHOL 220* 07/18/2012   CHOL 230* 04/07/2012   CHOL 215* 10/20/2011   Lab Results  Component Value Date   HDL 38.80* 07/18/2012   HDL 41.10 04/07/2012   HDL 78.29 10/20/2011   Lab Results  Component Value Date   LDLCALC 119* 02/17/2010   LDLCALC 119* 02/05/2009   LDLCALC 135* 12/01/2007   Lab Results  Component Value Date   TRIG 122.0 07/18/2012   TRIG 159.0* 04/07/2012   TRIG 138.0 10/20/2011   Lab Results  Component Value Date   CHOLHDL 6 07/18/2012   CHOLHDL 6 04/07/2012   CHOLHDL 5 10/20/2011   Lab Results  Component Value Date   LDLDIRECT 153.2 07/18/2012   LDLDIRECT 151.9 04/07/2012   LDLDIRECT 140.7 10/20/2011    Really pretty similar to what it was  Is eating a very good diet  Avoiding red meat and fried  Still eating egg yolks , occasionally high fat dairy , some cheese  No shellfish  occ bacon- not often   Still exercises most days of the week  Patient Active Problem List  Diagnosis  . HYPERLIPIDEMIA  . OBESITY  . HYPERTENSION  . PREMATURE VENTRICULAR CONTRACTIONS  . ALLERGIC RHINITIS  . GERD  . IBS  . Routine gynecological examination   Past Medical History  Diagnosis Date  . GERD (gastroesophageal reflux disease)   . IBS (irritable bowel  syndrome)   . PVC (premature ventricular contraction)     Hx of  . History of shingles   . Hyperlipidemia   . Hypertension    Past Surgical History  Procedure Date  . Cataract extraction 2009    bilateral  . Breast surgery     ? left breast cyst   History  Substance Use Topics  . Smoking status: Never Smoker   . Smokeless tobacco: Not on file  . Alcohol Use: No   Family History  Problem Relation Age of Onset  . Hypertension Mother   . Heart disease Mother     CAD  . Cancer Mother     breast CA  . Hypertension Sister   . Hypertension Brother   . Hypertension Brother    Allergies  Allergen Reactions  . Lisinopril     REACTION: cough   Current Outpatient Prescriptions on File Prior to Visit  Medication Sig Dispense Refill  . aspirin 81 MG tablet Take 160 mg by mouth daily.        . Calcium Carbonate-Vit D-Min 600-400 MG-UNIT TABS Take 1 tablet by mouth 2 (two) times daily.        . cycloSPORINE (RESTASIS) 0.05 %  ophthalmic emulsion Place 1 drop into both eyes 2 (two) times daily.        . hydrochlorothiazide (HYDRODIURIL) 25 MG tablet Take 1 tablet (25 mg total) by mouth daily.  90 tablet  3  . olmesartan (BENICAR) 20 MG tablet Take 1 tablet (20 mg total) by mouth daily.  90 tablet  3  . Omega-3 Fatty Acids (FISH OIL PO) Take 2 capsules by mouth daily.        Marland Kitchen omeprazole (PRILOSEC) 20 MG capsule Take 1 capsule (20 mg total) by mouth daily.  90 capsule  3  . Potassium 99 MG TABS Take 1 tablet by mouth daily.           Review of Systems     Objective:   Physical Exam  Nursing note and vitals reviewed. Constitutional: She appears well-developed and well-nourished. No distress.       obese and well appearing   HENT:  Head: Normocephalic and atraumatic.  Mouth/Throat: Oropharynx is clear and moist.  Eyes: Conjunctivae normal and EOM are normal. Pupils are equal, round, and reactive to light. Right eye exhibits no discharge. Left eye exhibits no discharge. No  scleral icterus.  Neck: Normal range of motion. Neck supple. No JVD present. Carotid bruit is not present. No thyromegaly present.  Cardiovascular: Normal rate, regular rhythm, normal heart sounds and intact distal pulses.  Exam reveals no gallop.   Pulmonary/Chest: Effort normal and breath sounds normal. No respiratory distress. She has no wheezes. She has no rales.  Abdominal: Soft. Bowel sounds are normal. She exhibits no distension, no abdominal bruit and no mass. There is no tenderness.  Musculoskeletal: She exhibits no edema.  Lymphadenopathy:    She has no cervical adenopathy.  Neurological: She is alert. She has normal reflexes. No cranial nerve deficit. She exhibits normal muscle tone. Coordination normal.  Skin: Skin is warm and dry. No rash noted. No erythema. No pallor.  Psychiatric: She has a normal mood and affect.          Assessment & Plan:

## 2012-07-25 NOTE — Assessment & Plan Note (Signed)
This is about the same  Pt declines med of any type Again - stressed low sat fat diet  Disc risks of high chol Disc goals for lipids and reasons to control them Rev labs with pt Rev low sat fat diet in detail

## 2012-10-07 ENCOUNTER — Other Ambulatory Visit: Payer: Self-pay | Admitting: *Deleted

## 2012-10-07 MED ORDER — OMEPRAZOLE 20 MG PO CPDR: 20.0000 mg | DELAYED_RELEASE_CAPSULE | Freq: Every day | ORAL | Status: DC

## 2012-10-13 ENCOUNTER — Other Ambulatory Visit: Payer: Self-pay | Admitting: *Deleted

## 2012-10-13 DIAGNOSIS — D3130 Benign neoplasm of unspecified choroid: Secondary | ICD-10-CM | POA: Diagnosis not present

## 2012-10-13 MED ORDER — OLMESARTAN MEDOXOMIL 20 MG PO TABS: 20.0000 mg | ORAL_TABLET | Freq: Every day | ORAL | Status: DC

## 2012-10-17 ENCOUNTER — Other Ambulatory Visit: Payer: Self-pay | Admitting: *Deleted

## 2012-10-17 MED ORDER — OLMESARTAN MEDOXOMIL 20 MG PO TABS: 20.0000 mg | ORAL_TABLET | Freq: Every day | ORAL | Status: DC

## 2012-10-27 ENCOUNTER — Other Ambulatory Visit: Payer: Self-pay | Admitting: *Deleted

## 2012-10-27 MED ORDER — HYDROCHLOROTHIAZIDE 25 MG PO TABS: 25.0000 mg | ORAL_TABLET | Freq: Every day | ORAL | Status: DC

## 2012-12-22 ENCOUNTER — Encounter: Payer: Self-pay | Admitting: Family Medicine

## 2012-12-22 DIAGNOSIS — Z1231 Encounter for screening mammogram for malignant neoplasm of breast: Secondary | ICD-10-CM | POA: Diagnosis not present

## 2012-12-22 DIAGNOSIS — Z803 Family history of malignant neoplasm of breast: Secondary | ICD-10-CM | POA: Diagnosis not present

## 2012-12-23 ENCOUNTER — Encounter: Payer: Self-pay | Admitting: *Deleted

## 2013-02-13 ENCOUNTER — Other Ambulatory Visit: Payer: Self-pay | Admitting: Family Medicine

## 2013-02-15 ENCOUNTER — Other Ambulatory Visit: Payer: Self-pay | Admitting: Family Medicine

## 2013-03-24 DIAGNOSIS — L578 Other skin changes due to chronic exposure to nonionizing radiation: Secondary | ICD-10-CM | POA: Diagnosis not present

## 2013-03-24 DIAGNOSIS — L819 Disorder of pigmentation, unspecified: Secondary | ICD-10-CM | POA: Diagnosis not present

## 2013-03-24 DIAGNOSIS — L259 Unspecified contact dermatitis, unspecified cause: Secondary | ICD-10-CM | POA: Diagnosis not present

## 2013-03-24 DIAGNOSIS — D1801 Hemangioma of skin and subcutaneous tissue: Secondary | ICD-10-CM | POA: Diagnosis not present

## 2013-04-17 ENCOUNTER — Telehealth: Payer: Self-pay | Admitting: Family Medicine

## 2013-04-17 DIAGNOSIS — K219 Gastro-esophageal reflux disease without esophagitis: Secondary | ICD-10-CM

## 2013-04-17 DIAGNOSIS — E785 Hyperlipidemia, unspecified: Secondary | ICD-10-CM

## 2013-04-17 DIAGNOSIS — I10 Essential (primary) hypertension: Secondary | ICD-10-CM

## 2013-04-17 NOTE — Telephone Encounter (Signed)
Message copied by Judy Pimple on Mon Apr 17, 2013  9:42 PM ------      Message from: Alvina Chou      Created: Tue Apr 11, 2013  2:24 PM      Regarding: Lab orders for Tuesday, 7.8.14       Patient is scheduled for CPX labs, please order future labs, Thanks , Terri       ------

## 2013-04-18 ENCOUNTER — Other Ambulatory Visit: Payer: 59

## 2013-04-25 ENCOUNTER — Encounter: Payer: 59 | Admitting: Family Medicine

## 2013-04-27 DIAGNOSIS — D3130 Benign neoplasm of unspecified choroid: Secondary | ICD-10-CM | POA: Diagnosis not present

## 2013-07-09 ENCOUNTER — Other Ambulatory Visit: Payer: Self-pay | Admitting: Family Medicine

## 2013-08-24 ENCOUNTER — Other Ambulatory Visit (INDEPENDENT_AMBULATORY_CARE_PROVIDER_SITE_OTHER): Payer: Medicare Other

## 2013-08-24 DIAGNOSIS — I10 Essential (primary) hypertension: Secondary | ICD-10-CM | POA: Diagnosis not present

## 2013-08-24 DIAGNOSIS — E669 Obesity, unspecified: Secondary | ICD-10-CM | POA: Diagnosis not present

## 2013-08-24 DIAGNOSIS — E785 Hyperlipidemia, unspecified: Secondary | ICD-10-CM

## 2013-08-24 LAB — CBC WITH DIFFERENTIAL/PLATELET
Basophils Absolute: 0 10*3/uL (ref 0.0–0.1)
Eosinophils Absolute: 0.4 10*3/uL (ref 0.0–0.7)
HCT: 41.6 % (ref 36.0–46.0)
Hemoglobin: 14.1 g/dL (ref 12.0–15.0)
Lymphs Abs: 2.5 10*3/uL (ref 0.7–4.0)
MCHC: 34 g/dL (ref 30.0–36.0)
MCV: 87.8 fl (ref 78.0–100.0)
Monocytes Relative: 5.6 % (ref 3.0–12.0)
Neutro Abs: 4.6 10*3/uL (ref 1.4–7.7)
RDW: 13.8 % (ref 11.5–14.6)

## 2013-08-24 LAB — COMPREHENSIVE METABOLIC PANEL
ALT: 23 U/L (ref 0–35)
AST: 21 U/L (ref 0–37)
BUN: 15 mg/dL (ref 6–23)
CO2: 29 mEq/L (ref 19–32)
Creatinine, Ser: 1 mg/dL (ref 0.4–1.2)
GFR: 56.7 mL/min — ABNORMAL LOW (ref >60.00)
Total Bilirubin: 0.8 mg/dL (ref 0.3–1.2)
Total Protein: 7.8 g/dL (ref 6.0–8.3)

## 2013-08-24 LAB — TSH: TSH: 0.78 u[IU]/mL (ref 0.35–5.50)

## 2013-08-24 LAB — LIPID PANEL
HDL: 38.1 mg/dL — ABNORMAL LOW (ref >39.00)
Total CHOL/HDL Ratio: 5
Triglycerides: 141 mg/dL (ref 0.0–149.0)
VLDL: 28.2 mg/dL (ref 0.0–40.0)

## 2013-08-25 ENCOUNTER — Other Ambulatory Visit: Payer: 59

## 2013-09-01 ENCOUNTER — Ambulatory Visit (INDEPENDENT_AMBULATORY_CARE_PROVIDER_SITE_OTHER): Payer: Medicare Other | Admitting: Family Medicine

## 2013-09-01 ENCOUNTER — Encounter: Payer: Self-pay | Admitting: Family Medicine

## 2013-09-01 VITALS — BP 132/86 | HR 61 | Temp 98.0°F | Ht 66.0 in | Wt 259.2 lb

## 2013-09-01 DIAGNOSIS — E785 Hyperlipidemia, unspecified: Secondary | ICD-10-CM

## 2013-09-01 DIAGNOSIS — Z0000 Encounter for general adult medical examination without abnormal findings: Secondary | ICD-10-CM | POA: Diagnosis not present

## 2013-09-01 DIAGNOSIS — Z23 Encounter for immunization: Secondary | ICD-10-CM

## 2013-09-01 DIAGNOSIS — I10 Essential (primary) hypertension: Secondary | ICD-10-CM

## 2013-09-01 DIAGNOSIS — E669 Obesity, unspecified: Secondary | ICD-10-CM

## 2013-09-01 MED ORDER — OLMESARTAN MEDOXOMIL 20 MG PO TABS: 20.0000 mg | ORAL_TABLET | Freq: Every day | ORAL | Status: DC

## 2013-09-01 MED ORDER — OMEPRAZOLE 20 MG PO CPDR: 20.0000 mg | DELAYED_RELEASE_CAPSULE | Freq: Every day | ORAL | Status: DC

## 2013-09-01 MED ORDER — HYDROCHLOROTHIAZIDE 25 MG PO TABS: 25.0000 mg | ORAL_TABLET | Freq: Every day | ORAL | Status: DC

## 2013-09-01 NOTE — Patient Instructions (Signed)
Don't forget to plan your mammogram in the spring  Take care of yourself Cut portions and also eat lower calorie foods for weight loss Keep up the exercise - increase to 5 days per week if you can

## 2013-09-01 NOTE — Progress Notes (Signed)
Pre-visit discussion using our clinic review tool. No additional management support is needed unless otherwise documented below in the visit note.  

## 2013-09-01 NOTE — Progress Notes (Signed)
Subjective:    Patient ID: Miranda Mueller, female    DOB: [birth year], 72 y.o.   MRN: [id number]  HPI I have personally reviewed the Medicare Annual Wellness questionnaire and have noted 1. The patient's medical and social history 2. Their use of alcohol, tobacco or illicit drugs 3. Their current medications and supplements 4. The patient's functional ability including ADL's, fall risks, home safety risks and hearing or visual             impairment. 5. Diet and physical activities 6. Evidence for depression or mood disorders  The patients weight, height, BMI have been recorded in the chart and visual acuity is per eye clinic.  I have made referrals, counseling and provided education to the patient based review of the above and I have provided the pt with a written personalized care plan for preventive services.  Has been doing ok and feeling good   See scanned forms.  Routine anticipatory guidance given to patient.  See health maintenance. Flu vaccine - to get today Shingles 2/09 PNA 5/11 vaccine  Tetanus vaccine 7/12 Colonoscopy nl 1/05 - with a 10 year recall  Breast cancer screening mammogram was 3/14  No lumps on self exam  No gyn problems / no hx of abn paps  Advance directive-has a living will and POA Cognitive function addressed- see scanned forms- and if abnormal then additional documentation follows. - no worries about memory at all   Wt is up 8 lb with bmi of 41   Exercise - still 45-60 minutes 3 times per week- also a lot of housework and yardwork  Eating has changed some - eating more/ larger portions ----now making an effort to be better    PMH and SH reviewed  Meds, vitals, and allergies reviewed.   ROS: See HPI.  Otherwise negative.    bp is stable today  No cp or palpitations or headaches or edema  No side effects to medicines  BP Readings from Last 3 Encounters:  09/01/13 132/86  07/25/12 128/74  04/19/12 130/70    Hyperlipidemia Lab Results   Component Value Date   CHOL 184 08/24/2013   CHOL 220* 07/18/2012   CHOL 230* 04/07/2012   Lab Results  Component Value Date   HDL 38.10* 08/24/2013   HDL 38.80* 07/18/2012   HDL 41.10 04/07/2012   Lab Results  Component Value Date   LDLCALC 118* 08/24/2013   LDLCALC 119* 02/17/2010   LDLCALC 119* 02/05/2009   Lab Results  Component Value Date   TRIG 141.0 08/24/2013   TRIG 122.0 07/18/2012   TRIG 159.0* 04/07/2012   Lab Results  Component Value Date   CHOLHDL 5 08/24/2013   CHOLHDL 6 07/18/2012   CHOLHDL 6 04/07/2012   Lab Results  Component Value Date   LDLDIRECT 153.2 07/18/2012   LDLDIRECT 151.9 04/07/2012   LDLDIRECT 140.7 10/20/2011   cholesterol is down from where it was  HDL is still a little  Does take fish oil   Patient Active Problem List   Diagnosis Date Noted  . Encounter for Medicare annual wellness exam 09/01/2013  . Routine gynecological examination 04/19/2012  . ALLERGIC RHINITIS 01/23/2009  . OBESITY 12/01/2007  . HYPERLIPIDEMIA 11/23/2007  . HYPERTENSION 11/23/2007  . PREMATURE VENTRICULAR CONTRACTIONS 11/23/2007  . GERD 11/23/2007  . IBS 11/23/2007   Past Medical History  Diagnosis Date  . GERD (gastroesophageal reflux disease)   . IBS (irritable bowel syndrome)   . PVC (premature ventricular contraction)  Hx of  . History of shingles   . Hyperlipidemia   . Hypertension    Past Surgical History  Procedure Laterality Date  . Cataract extraction  2009    bilateral  . Breast surgery      ? left breast cyst   History  Substance Use Topics  . Smoking status: Never Smoker   . Smokeless tobacco: Not on file  . Alcohol Use: No   Family History  Problem Relation Age of Onset  . Hypertension Mother   . Heart disease Mother     CAD  . Cancer Mother     breast CA  . Hypertension Sister   . Hypertension Brother   . Hypertension Brother    Allergies  Allergen Reactions  . Lisinopril     REACTION: cough   Current Outpatient  Prescriptions on File Prior to Visit  Medication Sig Dispense Refill  . aspirin 81 MG tablet Take 160 mg by mouth daily.        Marland Kitchen BENICAR 20 MG tablet Take 1 tablet (20 mg total) by mouth daily.  90 tablet  0  . Calcium Carbonate-Vit D-Min 600-400 MG-UNIT TABS Take 1 tablet by mouth 2 (two) times daily.        . cycloSPORINE (RESTASIS) 0.05 % ophthalmic emulsion Place 1 drop into both eyes 2 (two) times daily.        . hydrochlorothiazide (HYDRODIURIL) 25 MG tablet Take 1 tablet (25 mg total) by mouth daily.  90 tablet  0  . Omega-3 Fatty Acids (FISH OIL PO) Take 2 capsules by mouth daily.        Marland Kitchen omeprazole (PRILOSEC) 20 MG capsule Take 1 capsule (20 mg  total) by mouth daily.  90 capsule  0  . Potassium 99 MG TABS Take 1 tablet by mouth daily.         No current facility-administered medications on file prior to visit.      Review of Systems Review of Systems  Constitutional: Negative for fever, appetite change, fatigue and unexpected weight change.  Eyes: Negative for pain and visual disturbance.  Respiratory: Negative for cough and shortness of breath.   Cardiovascular: Negative for cp or palpitations    Gastrointestinal: Negative for nausea, diarrhea and constipation.  Genitourinary: Negative for urgency and frequency.  Skin: Negative for pallor or rash   Neurological: Negative for weakness, light-headedness, numbness and headaches.  Hematological: Negative for adenopathy. Does not bruise/bleed easily.  Psychiatric/Behavioral: Negative for dysphoric mood. The patient is not nervous/anxious.         Objective:   Physical Exam  Constitutional: She appears well-developed and well-nourished. No distress.  obese and well appearing   HENT:  Head: Normocephalic and atraumatic.  Right Ear: External ear normal.  Left Ear: External ear normal.  Mouth/Throat: Oropharynx is clear and moist.  Eyes: Conjunctivae and EOM are normal. Pupils are equal, round, and reactive to light. No  scleral icterus.  Neck: Normal range of motion. Neck supple. No JVD present. Carotid bruit is not present. No thyromegaly present.  Cardiovascular: Normal rate, regular rhythm, normal heart sounds and intact distal pulses.  Exam reveals no gallop.   Pulmonary/Chest: Effort normal and breath sounds normal. No respiratory distress. She has no wheezes. She exhibits no tenderness.  Abdominal: Soft. Bowel sounds are normal. She exhibits no distension, no abdominal bruit and no mass. There is no tenderness.  Genitourinary: No breast swelling, tenderness, discharge or bleeding.  Breast exam: No mass, nodules,  thickening, tenderness, bulging, retraction, inflamation, nipple discharge or skin changes noted.  No axillary or clavicular LA.  Chaperoned exam.    Musculoskeletal: Normal range of motion. She exhibits no edema and no tenderness.  Lymphadenopathy:    She has no cervical adenopathy.  Neurological: She is alert. She has normal reflexes. No cranial nerve deficit. She exhibits normal muscle tone. Coordination normal.  Skin: Skin is warm and dry. No rash noted. No erythema. No pallor.  Psychiatric: She has a normal mood and affect.          Assessment & Plan:

## 2013-09-03 NOTE — Assessment & Plan Note (Signed)
Discussed how this problem influences overall health and the risks it imposes  Reviewed plan for weight loss with lower calorie diet (via better food choices and also portion control or program like weight watchers) and exercise building up to or more than 30 minutes 5 days per week including some aerobic activity   Will work on 2 more d of exercise per week and also dec portions

## 2013-09-03 NOTE — Assessment & Plan Note (Signed)
BP: 132/86 mmHg   bp in fair control at this time  No changes needed Disc lifstyle change with low sodium diet and exercise

## 2013-09-03 NOTE — Assessment & Plan Note (Signed)
Disc goals for lipids and reasons to control them Rev labs with pt Rev low sat fat diet in detail Overall stable   

## 2013-09-03 NOTE — Assessment & Plan Note (Signed)
Reviewed health habits including diet and exercise and skin cancer prevention Reviewed appropriate screening tests for age  Also reviewed health mt list, fam hx and immunization status , as well as social and family history   See HPI Flu shot today Pt will schedule her own mammogram

## 2013-09-14 ENCOUNTER — Encounter: Payer: Self-pay | Admitting: *Deleted

## 2013-09-20 ENCOUNTER — Encounter: Payer: Self-pay | Admitting: Internal Medicine

## 2013-11-06 DIAGNOSIS — H26499 Other secondary cataract, unspecified eye: Secondary | ICD-10-CM | POA: Diagnosis not present

## 2014-01-18 DIAGNOSIS — R059 Cough, unspecified: Secondary | ICD-10-CM | POA: Diagnosis not present

## 2014-01-18 DIAGNOSIS — J019 Acute sinusitis, unspecified: Secondary | ICD-10-CM | POA: Diagnosis not present

## 2014-01-18 DIAGNOSIS — R05 Cough: Secondary | ICD-10-CM | POA: Diagnosis not present

## 2014-03-14 ENCOUNTER — Encounter: Payer: Self-pay | Admitting: Internal Medicine

## 2014-04-03 DIAGNOSIS — D1801 Hemangioma of skin and subcutaneous tissue: Secondary | ICD-10-CM | POA: Diagnosis not present

## 2014-04-03 DIAGNOSIS — L819 Disorder of pigmentation, unspecified: Secondary | ICD-10-CM | POA: Diagnosis not present

## 2014-04-03 DIAGNOSIS — L259 Unspecified contact dermatitis, unspecified cause: Secondary | ICD-10-CM | POA: Diagnosis not present

## 2014-04-03 DIAGNOSIS — L821 Other seborrheic keratosis: Secondary | ICD-10-CM | POA: Diagnosis not present

## 2014-04-03 DIAGNOSIS — L2089 Other atopic dermatitis: Secondary | ICD-10-CM | POA: Diagnosis not present

## 2014-05-07 DIAGNOSIS — H26499 Other secondary cataract, unspecified eye: Secondary | ICD-10-CM | POA: Diagnosis not present

## 2014-06-07 DIAGNOSIS — H26499 Other secondary cataract, unspecified eye: Secondary | ICD-10-CM | POA: Diagnosis not present

## 2014-06-27 ENCOUNTER — Other Ambulatory Visit: Payer: Self-pay | Admitting: Family Medicine

## 2014-06-27 NOTE — Telephone Encounter (Signed)
Please schedule annual exam for winter and refill until then 

## 2014-06-27 NOTE — Telephone Encounter (Signed)
Last office visit 09/04/2013.  No future appointments scheduled.  Ok to refill?

## 2014-06-29 NOTE — Telephone Encounter (Signed)
Left voicemail letting pt know she needs to schedule CPE

## 2014-07-06 DIAGNOSIS — H26499 Other secondary cataract, unspecified eye: Secondary | ICD-10-CM | POA: Diagnosis not present

## 2014-07-31 DIAGNOSIS — Z803 Family history of malignant neoplasm of breast: Secondary | ICD-10-CM | POA: Diagnosis not present

## 2014-07-31 DIAGNOSIS — Z1231 Encounter for screening mammogram for malignant neoplasm of breast: Secondary | ICD-10-CM | POA: Diagnosis not present

## 2014-08-08 DIAGNOSIS — N6489 Other specified disorders of breast: Secondary | ICD-10-CM | POA: Diagnosis not present

## 2014-08-08 DIAGNOSIS — N63 Unspecified lump in breast: Secondary | ICD-10-CM | POA: Diagnosis not present

## 2014-08-14 ENCOUNTER — Other Ambulatory Visit: Payer: Self-pay | Admitting: Radiology

## 2014-08-14 DIAGNOSIS — C50511 Malignant neoplasm of lower-outer quadrant of right female breast: Secondary | ICD-10-CM | POA: Diagnosis not present

## 2014-08-14 DIAGNOSIS — C50911 Malignant neoplasm of unspecified site of right female breast: Secondary | ICD-10-CM | POA: Diagnosis not present

## 2014-08-14 HISTORY — PX: OTHER SURGICAL HISTORY: SHX169

## 2014-08-15 ENCOUNTER — Telehealth: Payer: Self-pay | Admitting: *Deleted

## 2014-08-15 ENCOUNTER — Other Ambulatory Visit: Payer: Self-pay | Admitting: Radiology

## 2014-08-15 DIAGNOSIS — C50911 Malignant neoplasm of unspecified site of right female breast: Secondary | ICD-10-CM

## 2014-08-15 NOTE — Telephone Encounter (Signed)
Left message for a return phone call.  Awaiting patient response. 

## 2014-08-17 ENCOUNTER — Other Ambulatory Visit: Payer: Self-pay | Admitting: Family Medicine

## 2014-08-17 ENCOUNTER — Encounter: Payer: Self-pay | Admitting: *Deleted

## 2014-08-17 NOTE — [facility] Oncology Navigator Note (Signed)
Confirmed [place] appointment for 12:00 PM, 08/22/14.  Instructions and contact information given.

## 2014-08-17 NOTE — Telephone Encounter (Signed)
Left voicemail requesting pt to call and schedule her CPE

## 2014-08-17 NOTE — Telephone Encounter (Signed)
Electronic refill request, no recent/future appt., please advise  

## 2014-08-17 NOTE — Telephone Encounter (Signed)
Please schedule annual exam when able and refill until then

## 2014-08-20 ENCOUNTER — Other Ambulatory Visit: Payer: Self-pay | Admitting: *Deleted

## 2014-08-20 DIAGNOSIS — Z853 Personal history of malignant neoplasm of breast: Secondary | ICD-10-CM | POA: Insufficient documentation

## 2014-08-20 DIAGNOSIS — C50511 Malignant neoplasm of lower-outer quadrant of right female breast: Secondary | ICD-10-CM

## 2014-08-21 ENCOUNTER — Ambulatory Visit
Admission: RE | Admit: 2014-08-21 | Discharge: 2014-08-21 | Disposition: A | Discharge status: Home / Self Care | Payer: Medicare Other | Source: Ambulatory Visit | Attending: Radiology | Admitting: Radiology

## 2014-08-21 DIAGNOSIS — C50511 Malignant neoplasm of lower-outer quadrant of right female breast: Secondary | ICD-10-CM | POA: Diagnosis not present

## 2014-08-21 DIAGNOSIS — C50911 Malignant neoplasm of unspecified site of right female breast: Secondary | ICD-10-CM

## 2014-08-21 MED ORDER — GADOBENATE DIMEGLUMINE 529 MG/ML IV SOLN
20.0000 mL | Freq: Once | INTRAVENOUS | Status: AC | PRN
  Administered 2014-08-21: 20 mL via INTRAVENOUS

## 2014-08-22 ENCOUNTER — Encounter: Payer: Self-pay | Admitting: Hematology and Oncology

## 2014-08-22 ENCOUNTER — Ambulatory Visit: Payer: Medicare Other

## 2014-08-22 ENCOUNTER — Other Ambulatory Visit (INDEPENDENT_AMBULATORY_CARE_PROVIDER_SITE_OTHER): Payer: Self-pay | Admitting: General Surgery

## 2014-08-22 ENCOUNTER — Ambulatory Visit (HOSPITAL_BASED_OUTPATIENT_CLINIC_OR_DEPARTMENT_OTHER): Payer: Medicare Other | Admitting: Hematology and Oncology

## 2014-08-22 ENCOUNTER — Encounter (INDEPENDENT_AMBULATORY_CARE_PROVIDER_SITE_OTHER): Payer: Self-pay

## 2014-08-22 ENCOUNTER — Encounter: Payer: Self-pay | Admitting: Radiation Oncology

## 2014-08-22 ENCOUNTER — Other Ambulatory Visit (HOSPITAL_BASED_OUTPATIENT_CLINIC_OR_DEPARTMENT_OTHER): Payer: Medicare Other

## 2014-08-22 ENCOUNTER — Encounter: Payer: Self-pay | Admitting: Physical Therapy

## 2014-08-22 ENCOUNTER — Ambulatory Visit
Admission: RE | Admit: 2014-08-22 | Discharge: 2014-08-22 | Disposition: A | Discharge status: Home / Self Care | Payer: Medicare Other | Source: Ambulatory Visit | Attending: Radiation Oncology | Admitting: Radiation Oncology

## 2014-08-22 ENCOUNTER — Ambulatory Visit: Payer: Medicare Other | Attending: General Surgery | Admitting: Physical Therapy

## 2014-08-22 VITALS — BP 166/60 | HR 62 | Temp 97.6°F | Resp 18 | Ht 66.0 in | Wt 261.8 lb

## 2014-08-22 DIAGNOSIS — C50511 Malignant neoplasm of lower-outer quadrant of right female breast: Secondary | ICD-10-CM | POA: Diagnosis not present

## 2014-08-22 DIAGNOSIS — D0591 Unspecified type of carcinoma in situ of right breast: Secondary | ICD-10-CM | POA: Diagnosis not present

## 2014-08-22 DIAGNOSIS — Z5189 Encounter for other specified aftercare: Secondary | ICD-10-CM | POA: Insufficient documentation

## 2014-08-22 DIAGNOSIS — R609 Edema, unspecified: Secondary | ICD-10-CM | POA: Diagnosis not present

## 2014-08-22 DIAGNOSIS — C50911 Malignant neoplasm of unspecified site of right female breast: Secondary | ICD-10-CM

## 2014-08-22 LAB — COMPREHENSIVE METABOLIC PANEL (CC13)
ALK PHOS: 50 U/L (ref 40–150)
ALT: 22 U/L (ref 0–55)
AST: 17 U/L (ref 5–34)
Albumin: 3.9 g/dL (ref 3.5–5.0)
Anion Gap: 10 mEq/L (ref 3–11)
BUN: 16 mg/dL (ref 7.0–26.0)
CO2: 28 mEq/L (ref 22–29)
Calcium: 9.8 mg/dL (ref 8.4–10.4)
Chloride: 101 mEq/L (ref 98–109)
Creatinine: 1.1 mg/dL (ref 0.6–1.1)
GLUCOSE: 102 mg/dL (ref 70–140)
Potassium: 3.8 mEq/L (ref 3.5–5.1)
SODIUM: 138 meq/L (ref 136–145)
TOTAL PROTEIN: 7.6 g/dL (ref 6.4–8.3)
Total Bilirubin: 0.42 mg/dL (ref 0.20–1.20)

## 2014-08-22 LAB — CBC WITH DIFFERENTIAL/PLATELET
BASO%: 0.8 % (ref 0.0–2.0)
Basophils Absolute: 0.1 10*3/uL (ref 0.0–0.1)
EOS ABS: 0.4 10*3/uL (ref 0.0–0.5)
EOS%: 4.3 % (ref 0.0–7.0)
HCT: 42.7 % (ref 34.8–46.6)
HGB: 13.9 g/dL (ref 11.6–15.9)
LYMPH%: 30.4 % (ref 14.0–49.7)
MCH: 29 pg (ref 25.1–34.0)
MCHC: 32.7 g/dL (ref 31.5–36.0)
MCV: 88.7 fL (ref 79.5–101.0)
MONO#: 0.8 10*3/uL (ref 0.1–0.9)
MONO%: 7.9 % (ref 0.0–14.0)
NEUT%: 56.6 % (ref 38.4–76.8)
NEUTROS ABS: 5.5 10*3/uL (ref 1.5–6.5)
PLATELETS: 345 10*3/uL (ref 145–400)
RBC: 4.82 10*6/uL (ref 3.70–5.45)
RDW: 13.8 % (ref 11.2–14.5)
WBC: 9.7 10*3/uL (ref 3.9–10.3)
lymph#: 2.9 10*3/uL (ref 0.9–3.3)

## 2014-08-22 NOTE — Progress Notes (Signed)
Checked in new pt with no financial concerns at this time.  Pt has 2 insurances so financial assistance may not be needed but she has my card for any questions or concerns. ° °

## 2014-08-22 NOTE — Assessment & Plan Note (Addendum)
Right breast invasive ductal carcinoma 7 mm by ultrasound, 1.4 cm by MRI T1 C. N0 M0 clinical stage IA ER 2%, PR 0%, HER-2 negative, Ki-67 70%  Radiology and pathology counseling:Discussed with the patient, the details of pathology including the type of breast cancer,the clinical staging, the significance of ER, PR and HER-2/neu receptors and the implications for treatment. After reviewing the pathology in detail, we proceeded to discuss the different treatment options between surgery, radiation, chemotherapy, antiestrogen therapies.  Recommendation: Surgery followed by discussion regarding chemotherapy ( Mammaprint test may predict benefit to chemotherapy), antiestrogen therapy may be considered for 2% ER positivity.  I discussed with her that if she is triple negative breast cancer, it raises the risk of recurrence significantly especially with a Ki-67 of 70%. Normally our standard approach would be to offer her systemic chemotherapy. Patient does not want to do any chemotherapy. So I would see her after surgery to discuss the pros and cons and make a final decision. If she is not interested in chemotherapy, I would not send Mammaprint testing as this would not change how we treat her.

## 2014-08-22 NOTE — Progress Notes (Addendum)
Radiation Oncology         [phone] ________________________________  Initial outpatient Consultation  Name: Miranda Mueller MRN: [id number]  Date: 08/22/2014  DOB: 04-Jun-1941  JK:KXFGHW,EXHBZ, NP  Excell Seltzer, MD   REFERRING PHYSICIAN: Excell Seltzer, MD  DIAGNOSIS:    ICD-9-CM ICD-10-CM   1. Breast cancer of lower-outer quadrant of right female breast 174.5 C50.511      Stage I T1cN0M0 Breast LOQ High Grade Carcinoma, ER2% / PRneg / Her2neg, Ki 67 70%.  HISTORY OF PRESENT ILLNESS:Miranda Mueller is a 73 y.o. female who presented with recall from screening mammogram for a new right breast mass.  Faint density appreciated; on [place] it was 38m at 8:00.    Biopsy showed high grade carcinoma, ER 2%, PR neg and Her2 neg. Ki67 was 70%.  On MRI, there was 1.4cm of enhancement at biopsy site, no other findings.  She has had a dry cough, but no SOB. She feels it is related to post nasal drip.  PREVIOUS RADIATION THERAPY: No  PAST MEDICAL HISTORY:  has a past medical history of GERD (gastroesophageal reflux disease); IBS (irritable bowel syndrome); PVC (premature ventricular contraction); History of shingles; Hyperlipidemia; Hypertension; and Breast cancer.    PAST SURGICAL HISTORY: Past Surgical History  Procedure Laterality Date  . Cataract extraction  2009    bilateral  . Breast surgery      ? left breast cyst    FAMILY HISTORY: family history includes Breast cancer in her mother; Heart disease in her mother; Hypertension in her brother, brother, mother, and sister.  SOCIAL HISTORY:  reports that she has never smoked. She does not have any smokeless tobacco history on file. She reports that she does not drink alcohol or use illicit drugs.  ALLERGIES: Review of patient's allergies indicates no known allergies.  MEDICATIONS:  Current Outpatient Prescriptions  Medication Sig Dispense Refill  . BENICAR 20 MG tablet Take 1 tablet by mouth  daily 90 tablet 0  .  betamethasone dipropionate (DIPROLENE) 0.05 % cream Apply topically as needed.    . cycloSPORINE (RESTASIS) 0.05 % ophthalmic emulsion Place 1 drop into both eyes 2 (two) times daily.      .Marland Kitchenesomeprazole (NEXIUM) 40 MG capsule Take 40 mg by mouth as needed.    . hydrochlorothiazide (HYDRODIURIL) 25 MG tablet Take 1 tablet by mouth  daily 90 tablet 0  . Multiple Vitamins-Minerals (HAIR/SKIN/NAILS PO) Take 1 tablet by mouth daily.    . Multiple Vitamins-Minerals (OCUVITE PO) Take 1 tablet by mouth daily.    . Potassium 99 MG TABS Take 1 tablet by mouth daily.       No current facility-administered medications for this encounter.    REVIEW OF SYSTEMS:  Notable for that above.   PHYSICAL EXAM:    Vitals - 1 value per visit 116/96/7893 SYSTOLIC 1810 DIASTOLIC 60  Pulse 62  Temperature 97.6  Respirations 18  Weight (lb) 261.8  Height 5' 6"   BMI 42.28  VISIT REPORT    General: Alert and oriented, in no acute distress HEENT: Head is normocephalic. Pupils are equally round and reactive to light. Extraocular movements are intact. Oropharynx is clear. Neck: Neck is supple, no palpable cervical or supraclavicular lymphadenopathy. Heart: Regular in rate and rhythm with no murmurs, rubs, or gallops. Chest: Clear to auscultation bilaterally, with no rhonchi, wheezes, or rales. Abdomen: Soft, nontender, nondistended, with no rigidity or guarding. Extremities: No cyanosis or edema. Lymphatics: see HEENT Skin: No concerning  lesions. Musculoskeletal: symmetric strength and muscle tone throughout. Neurologic: Cranial nerves II through XII are grossly intact. No obvious focalities. Speech is fluent. Coordination is intact. Psychiatric: Judgment and insight are intact. Affect is appropriate. BREASTS: No lesions palpated in left breast or axillary regions bilaterally. Right breast notable for tenderness, bruising at biopsy site in LOQ. Hard to measure tumor due to tenderness.   ECOG = 0  0 -  Asymptomatic (Fully active, able to carry on all predisease activities without restriction)  1 - Symptomatic but completely ambulatory (Restricted in physically strenuous activity but ambulatory and able to carry out work of a light or sedentary nature. For example, light housework, office work)  2 - Symptomatic, <50% in bed during the day (Ambulatory and capable of all self care but unable to carry out any work activities. Up and about more than 50% of waking hours)  3 - Symptomatic, >50% in bed, but not bedbound (Capable of only limited self-care, confined to bed or chair 50% or more of waking hours)  4 - Bedbound (Completely disabled. Cannot carry on any self-care. Totally confined to bed or chair)  5 - Death   Eustace Pen MM, Creech RH, Tormey DC, et al. [phone]). "Toxicity and response criteria of the [facility]". [place] Oncol. 5 (6): 649-55   LABORATORY DATA:  Lab Results  Component Value Date   WBC 9.7 08/22/2014   HGB 13.9 08/22/2014   HCT 42.7 08/22/2014   MCV 88.7 08/22/2014   PLT 345 08/22/2014   CMP     Component Value Date/Time   NA 138 08/22/2014 1237   NA 138 08/24/2013 0834   K 3.8 08/22/2014 1237   K 4.0 08/24/2013 0834   CL 102 08/24/2013 0834   CO2 28 08/22/2014 1237   CO2 29 08/24/2013 0834   GLUCOSE 102 08/22/2014 1237   GLUCOSE 99 08/24/2013 0834   BUN 16.0 08/22/2014 1237   BUN 15 08/24/2013 0834   CREATININE 1.1 08/22/2014 1237   CREATININE 1.0 08/24/2013 0834   CALCIUM 9.8 08/22/2014 1237   CALCIUM 9.2 08/24/2013 0834   PROT 7.6 08/22/2014 1237   PROT 7.8 08/24/2013 0834   ALBUMIN 3.9 08/22/2014 1237   ALBUMIN 4.1 08/24/2013 0834   AST 17 08/22/2014 1237   AST 21 08/24/2013 0834   ALT 22 08/22/2014 1237   ALT 23 08/24/2013 0834   ALKPHOS 50 08/22/2014 1237   ALKPHOS 47 08/24/2013 0834   BILITOT 0.42 08/22/2014 1237   BILITOT 0.8 08/24/2013 0834   GFRNONAA 53.06 02/17/2010 1454   GFRAA 72 12/01/2007 1105           RADIOGRAPHY: Mr Breast Bilateral W Wo Contrast  08/21/2014   CLINICAL DATA:  Patient with recent diagnosis of right breast high-grade invasive carcinoma.  LABS:  BUN and creatinine were obtained on site at [facility] at  [address]  Results:  BUN 14 mg/dL,  Creatinine 1.0 mg/dL.  GFR = 56  EXAM: BILATERAL BREAST MRI WITH AND WITHOUT CONTRAST  TECHNIQUE: Multiplanar, multisequence MR images of both breasts were obtained prior to and following the intravenous administration of ml of MultiHance.  THREE-DIMENSIONAL MR IMAGE RENDERING ON INDEPENDENT WORKSTATION:  Three-dimensional MR images were rendered by post-processing of the original MR data on an independent workstation. The three-dimensional MR images were interpreted, and findings are reported in the following complete MRI report for this study. Three dimensional images were evaluated at the independent DynaCad workstation  COMPARISON:  Previous exams  FINDINGS: Breast composition: b.  Scattered fibroglandular tissue.  Background parenchymal enhancement: Mild  Right breast: Within the lower outer quadrant of the right breast middle to posterior depth there is a 1.2 x 1.2 x 1.4 cm enhancing mass containing central susceptibility artifact, compatible with recently biopsied right breast carcinoma. No additional areas of concerning enhancement are identified within the right breast. Along the lateral margin of the mass is a 1.8 x 1.8 cm peripherally enhancing intrinsically T1 bright hematoma.  Left breast: No mass or abnormal enhancement.  Lymph nodes: No abnormal appearing lymph nodes.  Ancillary findings:  None.  IMPRESSION: Enhancing right breast mass compatible with recently biopsy right breast carcinoma.  Along the lateral margin of the mass is a 1.8 cm peripherally enhancing hematoma.  No additional areas of concerning enhancement are identified within either breast.  RECOMMENDATION: Treatment plan.  BI-RADS CATEGORY  6: Known  biopsy-proven malignancy.   Electronically Signed   By: Lovey Newcomer M.D.   On: 08/21/2014 14:02      IMPRESSION/PLAN: Today, I talked to the patient about the findings and work-up thus far. We discussed the patient's diagnosis of right breast cancer and general treatment for this, highlighting the role of radiotherapy in the management. We discussed the available radiation techniques, and focused on the details of logistics and delivery.    She has been discussed at our multidisciplinary tumor board.  The consensus is that she would be a good candidate for breast conservation. I talked to her about the option of a mastectomy and informed her that her expected overall survival would be equivalent between mastectomy and breast conservation, based upon randomized controlled data. She is enthusiastic about breast conservation.  Adjuvant radiotherapy should decrease her risk of local recurrence in her breast by 2/3.  It was a pleasure meeting the patient today. We spoke about acute effects including skin irritation and fatigue as well as much less common late effects including lung irritation. We spoke about the latest technology that is used to minimize the risk of late effects for breast cancer patients undergoing radiotherapy. No guarantees of treatment were given. The patient is enthusiastic about proceeding with treatment. I look forward to participating in the patient's care.  Mammaprint will likely be ordered to determine chemotherapy recommendations. Chemotherapy would precede RT, if given.  Genetics referral will be made in light of her family history.  ______________________   Eppie Gibson, MD

## 2014-08-22 NOTE — Patient Instructions (Signed)

## 2014-08-22 NOTE — Progress Notes (Signed)
[facility] NOTE  Patient Care Team: Delia Chimes, NP as PCP - General (Nurse Practitioner) Rulon Eisenmenger, MD as Consulting Physician (Hematology and Oncology) Excell Seltzer, MD as Consulting Physician (General Surgery) Eppie Gibson, MD as Attending Physician (Radiation Oncology) Trinda Pascal, NP as Nurse Practitioner (Nurse Practitioner)  CHIEF COMPLAINTS/PURPOSE OF CONSULTATION:  Newly diagnosed breast cancer  HISTORY OF PRESENTING ILLNESS:  Miranda Mueller 73 y.o. female is here because of recent diagnosis of right breast cancer. She had a routine screening mammogram that revealed a 7 mm enhancement in the posterolateral aspect of the right breast. This led to an MRI of the breasts that revealed a 1.4 cm area of abnormality. She had a biopsy in the interim which revealed invasive ductal carcinoma that was ER 2%, PR 0%, HER-2 negative ratio 1.57 a Ki-67 70%. She was presented this morning in the multidisciplinary tumor board and she is here today to discuss the treatment plan. She has an extensive family history of breast cancer.  I reviewed her records extensively and collaborated the history with the patient.  SUMMARY OF ONCOLOGIC HISTORY:   Breast cancer of lower-outer quadrant of right female breast   08/14/2014 Initial Diagnosis Right breast high-grade invasive carcinoma spindle, epithelioid and squamous differentiation, ER 2%, PR 0%, HER-2 negative ratio 1.57, Ki-67 70%   08/21/2014 Breast MRI Right breast enhancement at the site of the biopsy 1.2 x 1.2 x 1.4 cm enhancing mass, along lateral margin 1.8 cm hematoma    In terms of breast cancer risk profile:  She menarched at early age of 5 and went to menopause at age 40  She had 1 pregnancy, her first child was born at age 27  She has received birth control pills for approximately 3-4 years.  She was never exposed to fertility medications or hormone replacement therapy.  She has family history  of Breast/GYN/GI cancer Mother breast cancer age 71 ; sister breast cancer at age 3  MEDICAL HISTORY:  Past Medical History  Diagnosis Date  . GERD (gastroesophageal reflux disease)   . IBS (irritable bowel syndrome)   . PVC (premature ventricular contraction)     Hx of  . History of shingles   . Hyperlipidemia   . Hypertension   . Breast cancer     SURGICAL HISTORY: Past Surgical History  Procedure Laterality Date  . Cataract extraction  2009    bilateral  . Breast surgery      ? left breast cyst    SOCIAL HISTORY: History   Social History  . Marital Status: Married    Spouse Name: N/A    Number of Children: N/A  . Years of Education: N/A   Occupational History  . Not on file.   Social History Main Topics  . Smoking status: Never Smoker   . Smokeless tobacco: Not on file  . Alcohol Use: No  . Drug Use: No  . Sexual Activity: Not on file   Other Topics Concern  . Not on file   Social History Narrative    FAMILY HISTORY: Family History  Problem Relation Age of Onset  . Hypertension Mother   . Heart disease Mother     CAD  . Breast cancer Mother   . Hypertension Sister   . Hypertension Brother   . Hypertension Brother     ALLERGIES:  has No Known Allergies.  MEDICATIONS:  Current Outpatient Prescriptions  Medication Sig Dispense Refill  . BENICAR 20 MG tablet  Take 1 tablet by mouth  daily 90 tablet 0  . betamethasone dipropionate (DIPROLENE) 0.05 % cream Apply topically as needed.    . cycloSPORINE (RESTASIS) 0.05 % ophthalmic emulsion Place 1 drop into both eyes 2 (two) times daily.      Marland Kitchen esomeprazole (NEXIUM) 40 MG capsule Take 40 mg by mouth as needed.    . hydrochlorothiazide (HYDRODIURIL) 25 MG tablet Take 1 tablet by mouth  daily 90 tablet 0  . Multiple Vitamins-Minerals (HAIR/SKIN/NAILS PO) Take 1 tablet by mouth daily.    . Multiple Vitamins-Minerals (OCUVITE PO) Take 1 tablet by mouth daily.    . Potassium 99 MG TABS Take 1 tablet by  mouth daily.       No current facility-administered medications for this visit.    REVIEW OF SYSTEMS:   Constitutional: Denies fevers, chills or abnormal night sweats Eyes: Denies blurriness of vision, double vision or watery eyes Ears, nose, mouth, throat, and face: Denies mucositis or sore throat Respiratory: Denies cough, dyspnea or wheezes Cardiovascular: Denies palpitation, chest discomfort or lower extremity swelling Gastrointestinal:  Denies nausea, heartburn or change in bowel habits Skin: Denies abnormal skin rashes Lymphatics: Denies new lymphadenopathy or easy bruising Neurological:Denies numbness, tingling or new weaknesses Behavioral/Psych: Mood is stable, no new changes  Breast:  Denies any palpable lumps or discharge All other systems were reviewed with the patient and are negative.  PHYSICAL EXAMINATION: ECOG PERFORMANCE STATUS: 1 - Symptomatic but completely ambulatory  Filed Vitals:   08/22/14 1251  BP: 166/60  Pulse: 62  Temp: 97.6 F (36.4 C)  Resp: 18   Filed Weights   08/22/14 1251  Weight: 261 lb 12.8 oz (118.752 kg)    GENERAL:alert, no distress and comfortable SKIN: skin color, texture, turgor are normal, no rashes or significant lesions EYES: normal, conjunctiva are pink and non-injected, sclera clear OROPHARYNX:no exudate, no erythema and lips, buccal mucosa, and tongue normal  NECK: supple, thyroid normal size, non-tender, without nodularity LYMPH:  no palpable lymphadenopathy in the cervical, axillary or inguinal LUNGS: clear to auscultation and percussion with normal breathing effort HEART: regular rate & rhythm and no murmurs and no lower extremity edema ABDOMEN:abdomen soft, non-tender and normal bowel sounds Musculoskeletal:no cyanosis of digits and no clubbing  PSYCH: alert & oriented x 3 with fluent speech NEURO: no focal motor/sensory deficits BREAST:No palpable nodules in breast. No palpable axillary or supraclavicular  lymphadenopathy  LABORATORY DATA:  I have reviewed the data as listed Lab Results  Component Value Date   WBC 9.7 08/22/2014   HGB 13.9 08/22/2014   HCT 42.7 08/22/2014   MCV 88.7 08/22/2014   PLT 345 08/22/2014   Lab Results  Component Value Date   NA 138 08/22/2014   K 3.8 08/22/2014   CL 102 08/24/2013   CO2 28 08/22/2014    RADIOGRAPHIC STUDIES: I have personally reviewed the radiological reports and agreed with the findings in the report. Report summarized as above  ASSESSMENT AND PLAN:  Breast cancer of lower-outer quadrant of right female breast Right breast invasive ductal carcinoma 7 mm by ultrasound, 1.4 cm by MRI T1 C. N0 M0 clinical stage IA ER 2%, PR 0%, HER-2 negative, Ki-67 70%  Radiology and pathology counseling:Discussed with the patient, the details of pathology including the type of breast cancer,the clinical staging, the significance of ER, PR and HER-2/neu receptors and the implications for treatment. After reviewing the pathology in detail, we proceeded to discuss the different treatment options between surgery, radiation,  chemotherapy, antiestrogen therapies.  Recommendation: Surgery followed by discussion regarding chemotherapy ( Mammaprint test may predict benefit to chemotherapy), antiestrogen therapy may be considered for 2% ER positivity.  I discussed with her that if she is triple negative breast cancer, it raises the risk of recurrence significantly especially with a Ki-67 of 70%. Normally our standard approach would be to offer her systemic chemotherapy. Patient does not want to do any chemotherapy. So I would see her after surgery to discuss the pros and cons and make a final decision. If she is not interested in chemotherapy, I would not send Mammaprint testing as this would not change how we treat her. genetics counseling appointment would be made  All questions were answered. The patient knows to call the clinic with any problems, questions or  concerns. I spent 55 minutes counseling the patient face to face. The total time spent in the appointment was 60 minutes and more than 50% was on counseling.     Rulon Eisenmenger, MD 08/22/2014 3:46 PM

## 2014-08-22 NOTE — Progress Notes (Signed)
[name] is a very pleasant 73 y.o.Marland Kitchen female from [place], [place] with newly diagnosed high-grade invasive ductal carcinoma of the right breast.  Biopsy results also revealed pathology indicating the tumor is very weak ER positive at 2%, PR negative, and HER2/neu negative.  She presents today with her family to the [facility]) for treatment consideration and recommendations from the breast surgeon, radiation oncologist, and medical oncologist.     I briefly met with [name] and her family during her [facility] visit today. We discussed the purpose of the Survivorship Clinic, which will include monitoring for recurrence, coordinating completion of age and gender-appropriate cancer screenings, promotion of overall wellness, as well as managing potential late/long-term side effects of anti-cancer treatments.    As of today, the treatment plan for [name] will likely include surgery, mammaprint/possible chemotherapy, and radiation therapy.  She will also meet with the Genetics Counselor due to her family history of breast cancer. Endocrine therapy will also be considered upon reviewing her final pathology after surgery.The intent of treatment for [name] is cure, therefore she is will be eligible for the Survivorship Clinic upon her completion of treatment.  Her survivorship care plan (SCP) document has been drafted and will be updated throughout the course of her treatment trajectory.  She will receive the SCP in an office visit with myself in the Survivorship Clinic once she has completed treatment.   [name] was encouraged to ask questions and all questions were answered to her satisfaction.  She was given my business card and encouraged to contact me with any concerns regarding survivorship.  I look forward to  participating in her care.

## 2014-08-22 NOTE — Addendum Note (Signed)
Encounter addended by: Eppie Gibson, MD on: 08/22/2014  3:34 PM<BR>     Documentation filed: Notes Section

## 2014-08-22 NOTE — Therapy (Signed)
Physical Therapy Evaluation  Patient Details  Name: Miranda Mueller MRN: [id number] Date of Birth: [birth year]  Encounter Date: 08/22/2014      PT End of Session - 08/22/14 1720    Visit Number 1   Number of Visits 1   PT Start Time 1340   PT Stop Time 1405   PT Time Calculation (min) 25 min   Activity Tolerance Patient tolerated treatment well   Behavior During Therapy [facility] for tasks assessed/performed      Past Medical History  Diagnosis Date  . GERD (gastroesophageal reflux disease)   . IBS (irritable bowel syndrome)   . PVC (premature ventricular contraction)     Hx of  . History of shingles   . Hyperlipidemia   . Hypertension   . Breast cancer     Past Surgical History  Procedure Laterality Date  . Cataract extraction  2009    bilateral  . Breast surgery      ? left breast cyst    There were no vitals taken for this visit.  Visit Diagnosis:  Carcinoma of lower outer quadrant of right breast      Subjective Assessment - 08/22/14 1706    Symptoms Patient is being seen at the breast multidisciplinary clinic today as she was diagnosed with right breast cancer last week.  She is here for a baseline physical therapy assessment.   Pertinent History Patient diagnosed on 08/15/14 with right ER positive (only 2%), PR negative, HER2 negative breast cancer with a Ki67 of 70%.   Patient Stated Goals Education on lymphedema risk reduction and post op shoulder exercise.   Currently in Pain? No/denies   Multiple Pain Sites No          [facility] PT Assessment - 08/22/14 0001    Assessment   Medical Diagnosis Right breast cancer   Onset Date 08/15/14   Precautions   Precautions Other (comment)  Active breast cancer   Balance Screen   Has the patient fallen in the past 6 months No   Has the patient had a decrease in activity level because of a fear of falling?  No   Is the patient reluctant to leave their home because of a fear of falling?  No   Home Environment   Living  Enviornment Private residence   Living Arrangements Spouse/significant other   Available Help at Discharge Family   Prior Function   Level of Independence Independent with basic ADLs   Vocation Retired   Leisure Patient goes to the gyum 3 days per week and walks on the treadmill and uses the bike for 20-30 minutes each time.   Cognition   Overall Cognitive Status Within Functional Limits for tasks assessed   Posture/Postural Control   Posture/Postural Control Postural limitations   Postural Limitations Rounded Shoulders;Forward head   AROM   Right Shoulder Extension 63 Degrees   Right Shoulder Flexion 151 Degrees   Right Shoulder ABduction 137 Degrees   Right Shoulder Internal Rotation 71 Degrees   Right Shoulder External Rotation 78 Degrees   Left Shoulder Extension 53 Degrees   Left Shoulder Flexion 143 Degrees   Left Shoulder ABduction 127 Degrees   Left Shoulder Internal Rotation 76 Degrees   Left Shoulder External Rotation 83 Degrees            PT Education - 08/22/14 1719    Education provided Yes   Education Details Post op shoulder range of motion exercises   Person(s) Educated Patient  Methods Explanation;Handout;Demonstration   Comprehension Verbalized understanding              Plan - September 15, 2014 1721    Clinical Impression Statement Patient was seen today for baseline assessment related to her new diagnosis of breast cancer.  She is planning to have a right lumpectomy and sentinel node biopsy likely followed by chemotherapy and radiation.  She will possibly benefit from physical therapy after completion of her surgery to regain function.   Pt will benefit from skilled therapeutic intervention in order to improve on the following deficits Decreased range of motion;Increased edema;Impaired UE functional use;Decreased knowledge of precautions;Postural dysfunction  if needed after surgery.   Rehab Potential Excellent   Clinical Impairments Affecting Rehab  Potential none   PT Frequency One time visit   PT Treatment/Interventions Therapeutic exercise;Patient/family education   Consulted and Agree with Plan of Care Patient          G-Codes - 2014/09/15 1727    Functional Assessment Tool Used clinical judgement   Functional Limitation Other PT primary   Other PT Primary Current Status (D3267) At least 1 percent but less than 20 percent impaired, limited or restricted   Other PT Primary Goal Status (T2458) At least 1 percent but less than 20 percent impaired, limited or restricted   Other PT Primary Discharge Status (K9983) At least 1 percent but less than 20 percent impaired, limited or restricted      Problem List Patient Active Problem List   Diagnosis Date Noted  . Breast cancer of lower-outer quadrant of right female breast 08/20/2014  . Encounter for Medicare annual wellness exam 09/01/2013  . Routine gynecological examination 04/19/2012  . ALLERGIC RHINITIS 01/23/2009  . OBESITY 12/01/2007  . HYPERLIPIDEMIA 11/23/2007  . HYPERTENSION 11/23/2007  . PREMATURE VENTRICULAR CONTRACTIONS 11/23/2007  . GERD 11/23/2007  . IBS 11/23/2007            LYMPHEDEMA/ONCOLOGY QUESTIONNAIRE - 09-15-14 1715    Type   Cancer Type Right breast cancer   Lymphedema Assessments   Lymphedema Assessments Upper extremities   Right Upper Extremity Lymphedema   10 cm Proximal to Olecranon Process 45.5 cm   Olecranon Process 30.5 cm   10 cm Proximal to Ulnar Styloid Process 27.3 cm   Just Proximal to Ulnar Styloid Process 19 cm   Across Hand at [employer] 19.9 cm   At [employer] of 2nd Digit 6.6 cm   Left Upper Extremity Lymphedema   10 cm Proximal to Olecranon Process 45 cm   Olecranon Process 32.1 cm   10 cm Proximal to Ulnar Styloid Process 27.6 cm   Just Proximal to Ulnar Styloid Process 17 cm   Across Hand at [employer] 19 cm   At [place] of 2nd Digit 6.5 cm           Breast Clinic Goals - 2014/09/15 1726    Patient will be able  to verbalize understanding of pertinent lymphedema risk reduction practices relevant to her diagnosis specifically related to skin care.   Time 1   Period Days   Status Achieved   Patient will be able to return demonstrate and/or verbalize understanding of the post-op home exercise program related to regaining shoulder range of motion.   Time 1   Period Days   Status Achieved   Patient will be able to verbalize understanding of the importance of attending the postoperative After Breast Cancer Class for further lymphedema risk reduction education and therapeutic exercise.  Time 1   Period Days   Status Achieved        SMITH,MARTI COOPER, PT   08/22/2014, 5:29 PM

## 2014-08-23 ENCOUNTER — Encounter: Payer: Self-pay | Admitting: General Practice

## 2014-08-23 NOTE — Progress Notes (Signed)
[facility] Psychosocial Distress Screening Spiritual Care  Visited with Miranda Mueller, husband Homer, and daughter Rip Harbour to introduce [place] [employer] [place] tea/resources, and to review distress screen per protocol. The patient scored a 5 on the Psychosocial Distress Thermometer which indicates moderate distress. Also assessed for distress and other psychosocial needs.   ONCBCN DISTRESS SCREENING 08/23/2014  Screening Type Initial Screening  Distress experienced in past week (1-10) 5  Emotional problem type Nervousness/Anxiety  Referral to clinical social work Yes  Referral to support programs Yes   Per pt, she has good support from immediate family, friends, church, and nieces.  She has several friends and acquaintances who have had breast cancer, including one whom pt accompanied to her chemo bell-ringing in August; this community is helpful with support, normalizing dx, and anticipating tx.    Follow up needed: No.  Provided pt/family with information about support services and contact info for chaplain and team, but please also page as needs arise:  [phone].  Thank you.  [place], [place]

## 2014-08-23 NOTE — Progress Notes (Signed)
Note created by Dr. Gudena during office visit. Copy to patient, original to scan. 

## 2014-08-24 ENCOUNTER — Other Ambulatory Visit (INDEPENDENT_AMBULATORY_CARE_PROVIDER_SITE_OTHER): Payer: Self-pay | Admitting: General Surgery

## 2014-08-24 ENCOUNTER — Encounter (HOSPITAL_BASED_OUTPATIENT_CLINIC_OR_DEPARTMENT_OTHER): Payer: Self-pay | Admitting: *Deleted

## 2014-08-24 ENCOUNTER — Ambulatory Visit (INDEPENDENT_AMBULATORY_CARE_PROVIDER_SITE_OTHER): Payer: Self-pay | Admitting: Surgery

## 2014-08-24 DIAGNOSIS — C50911 Malignant neoplasm of unspecified site of right female breast: Secondary | ICD-10-CM

## 2014-08-24 NOTE — Progress Notes (Signed)
To come in for ekg when she goes to get seeds

## 2014-08-24 NOTE — Progress Notes (Signed)
   08/24/14 1415  OBSTRUCTIVE SLEEP APNEA  Have you ever been diagnosed with sleep apnea through a sleep study? No  Do you snore loudly (loud enough to be heard through closed doors)?  1  Do you often feel tired, fatigued, or sleepy during the daytime? 0  Has anyone observed you stop breathing during your sleep? 0  Do you have, or are you being treated for high blood pressure? 1  BMI more than 35 kg/m2? 1  Age over 72 years old? 1  Gender: 0  Obstructive Sleep Apnea Score 4  Score 4 or greater  Results sent to PCP

## 2014-08-27 ENCOUNTER — Telehealth: Payer: Self-pay | Admitting: Hematology and Oncology

## 2014-08-27 ENCOUNTER — Telehealth: Payer: Self-pay | Admitting: *Deleted

## 2014-08-27 ENCOUNTER — Encounter (HOSPITAL_BASED_OUTPATIENT_CLINIC_OR_DEPARTMENT_OTHER)
Admission: RE | Admit: 2014-08-27 | Discharge: 2014-08-27 | Disposition: A | Discharge status: Home / Self Care | Payer: Medicare Other | Source: Ambulatory Visit | Attending: General Surgery | Admitting: General Surgery

## 2014-08-27 ENCOUNTER — Other Ambulatory Visit (INDEPENDENT_AMBULATORY_CARE_PROVIDER_SITE_OTHER): Payer: Self-pay | Admitting: General Surgery

## 2014-08-27 ENCOUNTER — Other Ambulatory Visit: Payer: Self-pay

## 2014-08-27 DIAGNOSIS — I10 Essential (primary) hypertension: Secondary | ICD-10-CM | POA: Diagnosis not present

## 2014-08-27 DIAGNOSIS — K219 Gastro-esophageal reflux disease without esophagitis: Secondary | ICD-10-CM | POA: Diagnosis not present

## 2014-08-27 DIAGNOSIS — E780 Pure hypercholesterolemia: Secondary | ICD-10-CM | POA: Diagnosis not present

## 2014-08-27 DIAGNOSIS — C50911 Malignant neoplasm of unspecified site of right female breast: Secondary | ICD-10-CM

## 2014-08-27 DIAGNOSIS — I493 Ventricular premature depolarization: Secondary | ICD-10-CM | POA: Diagnosis not present

## 2014-08-27 DIAGNOSIS — C50511 Malignant neoplasm of lower-outer quadrant of right female breast: Secondary | ICD-10-CM | POA: Diagnosis not present

## 2014-08-27 DIAGNOSIS — E785 Hyperlipidemia, unspecified: Secondary | ICD-10-CM | POA: Diagnosis not present

## 2014-08-27 DIAGNOSIS — N63 Unspecified lump in breast: Secondary | ICD-10-CM | POA: Diagnosis not present

## 2014-08-27 DIAGNOSIS — E669 Obesity, unspecified: Secondary | ICD-10-CM | POA: Diagnosis not present

## 2014-08-27 DIAGNOSIS — F419 Anxiety disorder, unspecified: Secondary | ICD-10-CM | POA: Diagnosis not present

## 2014-08-27 DIAGNOSIS — Z6841 Body Mass Index (BMI) 40.0 and over, adult: Secondary | ICD-10-CM | POA: Diagnosis not present

## 2014-08-27 DIAGNOSIS — M1990 Unspecified osteoarthritis, unspecified site: Secondary | ICD-10-CM | POA: Diagnosis not present

## 2014-08-27 NOTE — Telephone Encounter (Signed)
lvm for pt regarding to NOV and DEC appt....mailed pt appt sched and letter

## 2014-08-27 NOTE — Telephone Encounter (Signed)
Received message from patient that she needed to change her genetic appointment on 08/30/14 due to her surgery is on 08/29/14.  Called patient back and left message for a return phone call so we can change her appointment.

## 2014-08-28 ENCOUNTER — Telehealth: Payer: Self-pay | Admitting: *Deleted

## 2014-08-28 NOTE — Telephone Encounter (Signed)
Received call back from patient and rescheduled her genetic appointment due to surgery 08/29/14.  Confirmed new appointment for 09/10/14 at [facility].  Encouraged patient to call with any needs or concerns.

## 2014-08-29 ENCOUNTER — Encounter (HOSPITAL_BASED_OUTPATIENT_CLINIC_OR_DEPARTMENT_OTHER): Payer: Self-pay | Admitting: *Deleted

## 2014-08-29 ENCOUNTER — Ambulatory Visit (HOSPITAL_BASED_OUTPATIENT_CLINIC_OR_DEPARTMENT_OTHER)
Admission: RE | Admit: 2014-08-29 | Discharge: 2014-08-29 | Disposition: A | Discharge status: Home / Self Care | Payer: Medicare Other | Source: Ambulatory Visit | Attending: General Surgery | Admitting: General Surgery

## 2014-08-29 ENCOUNTER — Ambulatory Visit (HOSPITAL_BASED_OUTPATIENT_CLINIC_OR_DEPARTMENT_OTHER): Payer: Medicare Other | Admitting: Anesthesiology

## 2014-08-29 ENCOUNTER — Encounter (HOSPITAL_COMMUNITY)
Admission: RE | Admit: 2014-08-29 | Discharge: 2014-08-29 | Disposition: A | Discharge status: Home / Self Care | Payer: Medicare Other | Source: Ambulatory Visit | Attending: General Surgery | Admitting: General Surgery

## 2014-08-29 ENCOUNTER — Encounter (HOSPITAL_BASED_OUTPATIENT_CLINIC_OR_DEPARTMENT_OTHER)
Admission: RE | Disposition: A | Discharge status: Home / Self Care | Payer: Self-pay | Source: Ambulatory Visit | Attending: General Surgery

## 2014-08-29 DIAGNOSIS — F419 Anxiety disorder, unspecified: Secondary | ICD-10-CM | POA: Insufficient documentation

## 2014-08-29 DIAGNOSIS — C50511 Malignant neoplasm of lower-outer quadrant of right female breast: Secondary | ICD-10-CM | POA: Insufficient documentation

## 2014-08-29 DIAGNOSIS — C50111 Malignant neoplasm of central portion of right female breast: Secondary | ICD-10-CM | POA: Diagnosis not present

## 2014-08-29 DIAGNOSIS — C50911 Malignant neoplasm of unspecified site of right female breast: Secondary | ICD-10-CM

## 2014-08-29 DIAGNOSIS — G8918 Other acute postprocedural pain: Secondary | ICD-10-CM | POA: Diagnosis not present

## 2014-08-29 DIAGNOSIS — E780 Pure hypercholesterolemia: Secondary | ICD-10-CM | POA: Insufficient documentation

## 2014-08-29 DIAGNOSIS — E785 Hyperlipidemia, unspecified: Secondary | ICD-10-CM | POA: Insufficient documentation

## 2014-08-29 DIAGNOSIS — I10 Essential (primary) hypertension: Secondary | ICD-10-CM | POA: Insufficient documentation

## 2014-08-29 DIAGNOSIS — E669 Obesity, unspecified: Secondary | ICD-10-CM | POA: Insufficient documentation

## 2014-08-29 DIAGNOSIS — M1990 Unspecified osteoarthritis, unspecified site: Secondary | ICD-10-CM | POA: Diagnosis not present

## 2014-08-29 DIAGNOSIS — I493 Ventricular premature depolarization: Secondary | ICD-10-CM | POA: Insufficient documentation

## 2014-08-29 DIAGNOSIS — N63 Unspecified lump in breast: Secondary | ICD-10-CM | POA: Diagnosis not present

## 2014-08-29 DIAGNOSIS — C50919 Malignant neoplasm of unspecified site of unspecified female breast: Secondary | ICD-10-CM

## 2014-08-29 DIAGNOSIS — Z6841 Body Mass Index (BMI) 40.0 and over, adult: Secondary | ICD-10-CM | POA: Insufficient documentation

## 2014-08-29 DIAGNOSIS — K219 Gastro-esophageal reflux disease without esophagitis: Secondary | ICD-10-CM | POA: Insufficient documentation

## 2014-08-29 DIAGNOSIS — R079 Chest pain, unspecified: Secondary | ICD-10-CM | POA: Diagnosis not present

## 2014-08-29 HISTORY — PX: RADIOACTIVE SEED GUIDED PARTIAL MASTECTOMY WITH AXILLARY SENTINEL LYMPH NODE BIOPSY: SHX6520

## 2014-08-29 HISTORY — PX: BREAST LUMPECTOMY: SHX2

## 2014-08-29 HISTORY — DX: Malignant neoplasm of unspecified site of unspecified female breast: C50.919

## 2014-08-29 SURGERY — RADIOACTIVE SEED GUIDED PARTIAL MASTECTOMY WITH AXILLARY SENTINEL LYMPH NODE BIOPSY
Anesthesia: General | Laterality: Right

## 2014-08-29 MED ORDER — CEFAZOLIN SODIUM-DEXTROSE 2-3 GM-% IV SOLR
2.0000 g | INTRAVENOUS | Status: AC
  Administered 2014-08-29: 2 g via INTRAVENOUS

## 2014-08-29 MED ORDER — SODIUM CHLORIDE 0.9 % IJ SOLN
INTRAMUSCULAR | Status: DC | PRN
  Administered 2014-08-29: 5 mL

## 2014-08-29 MED ORDER — OXYCODONE HCL 5 MG/5ML PO SOLN: 5.0000 mg | Freq: Once | ORAL | Status: AC | PRN

## 2014-08-29 MED ORDER — EPHEDRINE SULFATE 50 MG/ML IJ SOLN
INTRAMUSCULAR | Status: DC | PRN
  Administered 2014-08-29 (×3): 10 mg via INTRAVENOUS

## 2014-08-29 MED ORDER — SODIUM CHLORIDE 0.9 % IJ SOLN
INTRAMUSCULAR | Status: AC
  Filled 2014-08-29: qty 10

## 2014-08-29 MED ORDER — CHLORHEXIDINE GLUCONATE 4 % EX LIQD
1.0000 | Freq: Once | CUTANEOUS | Status: DC
Start: 2014-08-30 — End: 2014-08-29

## 2014-08-29 MED ORDER — GLYCOPYRROLATE 0.2 MG/ML IJ SOLN
INTRAMUSCULAR | Status: DC | PRN
  Administered 2014-08-29: 0.2 mg via INTRAVENOUS

## 2014-08-29 MED ORDER — HYDROMORPHONE HCL 1 MG/ML IJ SOLN
INTRAMUSCULAR | Status: AC
  Filled 2014-08-29: qty 1

## 2014-08-29 MED ORDER — OXYCODONE HCL 5 MG PO TABS
5.0000 mg | ORAL_TABLET | Freq: Once | ORAL | Status: AC | PRN
  Administered 2014-08-29: 5 mg via ORAL

## 2014-08-29 MED ORDER — CEFAZOLIN SODIUM-DEXTROSE 2-3 GM-% IV SOLR
INTRAVENOUS | Status: AC
  Filled 2014-08-29: qty 50

## 2014-08-29 MED ORDER — CHLORHEXIDINE GLUCONATE 4 % EX LIQD: 1.0000 "application " | Freq: Once | CUTANEOUS | Status: DC

## 2014-08-29 MED ORDER — MIDAZOLAM HCL 2 MG/2ML IJ SOLN
1.0000 mg | INTRAMUSCULAR | Status: DC | PRN
  Administered 2014-08-29: 2 mg via INTRAVENOUS

## 2014-08-29 MED ORDER — PHENYLEPHRINE HCL 10 MG/ML IJ SOLN
10.0000 mg | INTRAVENOUS | Status: DC | PRN
  Administered 2014-08-29: 50 ug/min via INTRAVENOUS

## 2014-08-29 MED ORDER — HYDROMORPHONE HCL 1 MG/ML IJ SOLN
0.2500 mg | INTRAMUSCULAR | Status: DC | PRN
  Administered 2014-08-29 (×2): 0.5 mg via INTRAVENOUS

## 2014-08-29 MED ORDER — DEXAMETHASONE SODIUM PHOSPHATE 4 MG/ML IJ SOLN
INTRAMUSCULAR | Status: DC | PRN
  Administered 2014-08-29: 10 mg via INTRAVENOUS

## 2014-08-29 MED ORDER — FENTANYL CITRATE 0.05 MG/ML IJ SOLN
INTRAMUSCULAR | Status: DC | PRN
  Administered 2014-08-29 (×2): 50 ug via INTRAVENOUS

## 2014-08-29 MED ORDER — SCOPOLAMINE 1 MG/3DAYS TD PT72
1.0000 | MEDICATED_PATCH | TRANSDERMAL | Status: DC
  Administered 2014-08-29: 1.5 mg via TRANSDERMAL

## 2014-08-29 MED ORDER — OXYCODONE HCL 5 MG PO TABS
ORAL_TABLET | ORAL | Status: AC
  Filled 2014-08-29: qty 1

## 2014-08-29 MED ORDER — PROPOFOL 10 MG/ML IV BOLUS
INTRAVENOUS | Status: DC | PRN
  Administered 2014-08-29: 170 mg via INTRAVENOUS
  Administered 2014-08-29: 30 mg via INTRAVENOUS

## 2014-08-29 MED ORDER — TECHNETIUM TC 99M SULFUR COLLOID FILTERED
1.0000 | Freq: Once | INTRAVENOUS | Status: AC | PRN
  Administered 2014-08-29: 1 via INTRADERMAL

## 2014-08-29 MED ORDER — PROPOFOL 10 MG/ML IV BOLUS
INTRAVENOUS | Status: AC
Start: 2014-08-29 — End: 2014-08-29
  Filled 2014-08-29: qty 20

## 2014-08-29 MED ORDER — LIDOCAINE HCL (CARDIAC) 20 MG/ML IV SOLN
INTRAVENOUS | Status: DC | PRN
  Administered 2014-08-29: 100 mg via INTRAVENOUS

## 2014-08-29 MED ORDER — HYDROCODONE-ACETAMINOPHEN 5-325 MG PO TABS: 1.0000 | ORAL_TABLET | ORAL | Status: DC | PRN

## 2014-08-29 MED ORDER — BUPIVACAINE-EPINEPHRINE (PF) 0.25% -1:200000 IJ SOLN
INTRAMUSCULAR | Status: AC
  Filled 2014-08-29: qty 30

## 2014-08-29 MED ORDER — MIDAZOLAM HCL 2 MG/2ML IJ SOLN
INTRAMUSCULAR | Status: AC
  Filled 2014-08-29: qty 2

## 2014-08-29 MED ORDER — LACTATED RINGERS IV SOLN
INTRAVENOUS | Status: DC
  Administered 2014-08-29 (×2): via INTRAVENOUS

## 2014-08-29 MED ORDER — FENTANYL CITRATE 0.05 MG/ML IJ SOLN
50.0000 ug | INTRAMUSCULAR | Status: DC | PRN
  Administered 2014-08-29: 100 ug via INTRAVENOUS

## 2014-08-29 MED ORDER — BUPIVACAINE-EPINEPHRINE (PF) 0.5% -1:200000 IJ SOLN
INTRAMUSCULAR | Status: DC | PRN
  Administered 2014-08-29: 30 mL via PERINEURAL

## 2014-08-29 MED ORDER — METHYLENE BLUE 1 % INJ SOLN
INTRAMUSCULAR | Status: AC
  Filled 2014-08-29: qty 10

## 2014-08-29 MED ORDER — FENTANYL CITRATE 0.05 MG/ML IJ SOLN
INTRAMUSCULAR | Status: AC
  Filled 2014-08-29: qty 4

## 2014-08-29 MED ORDER — FENTANYL CITRATE 0.05 MG/ML IJ SOLN
INTRAMUSCULAR | Status: AC
  Filled 2014-08-29: qty 2

## 2014-08-29 MED ORDER — ONDANSETRON HCL 4 MG/2ML IJ SOLN
4.0000 mg | Freq: Once | INTRAMUSCULAR | Status: DC | PRN
Start: 2014-08-29 — End: 2014-08-29

## 2014-08-29 MED ORDER — SCOPOLAMINE 1 MG/3DAYS TD PT72
MEDICATED_PATCH | TRANSDERMAL | Status: AC
  Filled 2014-08-29: qty 1

## 2014-08-29 SURGICAL SUPPLY — 53 items
APPLIER CLIP 9.375 MED OPEN (MISCELLANEOUS) ×3
APR CLP MED 9.3 20 MLT OPN (MISCELLANEOUS) ×1
BINDER BREAST LRG (GAUZE/BANDAGES/DRESSINGS) IMPLANT
BINDER BREAST MEDIUM (GAUZE/BANDAGES/DRESSINGS) IMPLANT
BINDER BREAST XLRG (GAUZE/BANDAGES/DRESSINGS) IMPLANT
BINDER BREAST XXLRG (GAUZE/BANDAGES/DRESSINGS) ×2 IMPLANT
BLADE SURG 15 STRL LF DISP TIS (BLADE) ×1 IMPLANT
BLADE SURG 15 STRL SS (BLADE) ×3
CANISTER SUC SOCK COL 7IN (MISCELLANEOUS) IMPLANT
CANISTER SUCT 1200ML W/VALVE (MISCELLANEOUS) ×2 IMPLANT
CHLORAPREP W/TINT 26ML (MISCELLANEOUS) ×3 IMPLANT
CLIP APPLIE 9.375 MED OPEN (MISCELLANEOUS) ×1 IMPLANT
CLIP TI WIDE RED SMALL 6 (CLIP) IMPLANT
COVER BACK TABLE 60X90IN (DRAPES) ×3 IMPLANT
COVER MAYO STAND STRL (DRAPES) ×3 IMPLANT
COVER PROBE W GEL 5X96 (DRAPES) ×3 IMPLANT
DECANTER SPIKE VIAL GLASS SM (MISCELLANEOUS) IMPLANT
DEVICE DUBIN W/COMP PLATE 8390 (MISCELLANEOUS) ×3 IMPLANT
DRAPE LAPAROSCOPIC ABDOMINAL (DRAPES) ×2 IMPLANT
DRAPE UTILITY XL STRL (DRAPES) ×3 IMPLANT
ELECT COATED BLADE 2.86 ST (ELECTRODE) ×3 IMPLANT
ELECT REM PT RETURN 9FT ADLT (ELECTROSURGICAL) ×3
ELECTRODE REM PT RTRN 9FT ADLT (ELECTROSURGICAL) ×1 IMPLANT
GLOVE BIO SURGEON STRL SZ7.5 (GLOVE) ×2 IMPLANT
GLOVE BIOGEL PI IND STRL 8 (GLOVE) ×1 IMPLANT
GLOVE BIOGEL PI INDICATOR 8 (GLOVE) ×2
GLOVE SS BIOGEL STRL SZ 7.5 (GLOVE) ×1 IMPLANT
GLOVE SUPERSENSE BIOGEL SZ 7.5 (GLOVE) ×2
GOWN STRL REUS W/ TWL LRG LVL3 (GOWN DISPOSABLE) ×2 IMPLANT
GOWN STRL REUS W/ TWL XL LVL3 (GOWN DISPOSABLE) IMPLANT
GOWN STRL REUS W/TWL LRG LVL3 (GOWN DISPOSABLE) ×6
GOWN STRL REUS W/TWL XL LVL3 (GOWN DISPOSABLE) ×3
KIT MARKER MARGIN INK (KITS) ×3 IMPLANT
LIQUID BAND (GAUZE/BANDAGES/DRESSINGS) ×3 IMPLANT
NDL HYPO 25X1 1.5 SAFETY (NEEDLE) ×1 IMPLANT
NDL SAFETY ECLIPSE 18X1.5 (NEEDLE) IMPLANT
NEEDLE HYPO 18GX1.5 SHARP (NEEDLE) ×3
NEEDLE HYPO 25X1 1.5 SAFETY (NEEDLE) ×6 IMPLANT
NS IRRIG 1000ML POUR BTL (IV SOLUTION) ×2 IMPLANT
PACK BASIN DAY SURGERY FS (CUSTOM PROCEDURE TRAY) ×3 IMPLANT
PENCIL BUTTON HOLSTER BLD 10FT (ELECTRODE) ×3 IMPLANT
SLEEVE SCD COMPRESS KNEE MED (MISCELLANEOUS) ×3 IMPLANT
SPONGE LAP 4X18 X RAY DECT (DISPOSABLE) ×3 IMPLANT
SUT MNCRL AB 4-0 PS2 18 (SUTURE) ×3 IMPLANT
SUT SILK 2 0 SH (SUTURE) IMPLANT
SUT VIC AB 3-0 SH 27 (SUTURE) ×3
SUT VIC AB 3-0 SH 27X BRD (SUTURE) ×1 IMPLANT
SYR CONTROL 10ML LL (SYRINGE) ×5 IMPLANT
TOWEL OR 17X24 6PK STRL BLUE (TOWEL DISPOSABLE) ×3 IMPLANT
TOWEL OR NON WOVEN STRL DISP B (DISPOSABLE) ×3 IMPLANT
TUBE CONNECTING 20'X1/4 (TUBING) ×1
TUBE CONNECTING 20X1/4 (TUBING) ×1 IMPLANT
YANKAUER SUCT BULB TIP NO VENT (SUCTIONS) IMPLANT

## 2014-08-29 NOTE — Op Note (Signed)
Preoperative Diagnosis: cancer right breast  Postoprative Diagnosis: cancer right breast  Procedure: Procedure(s): SEED And wire LOCALIZED RIGHT BREAST LUMPECTOMY AND RIGHT AXILLARY SENTTINEL LYMPH NODE BIOPSY   Surgeon: Excell Seltzer T   Assistants: Lorine Bears  Anesthesia:  General LMA anesthesia  Indications: Patient is a 73 year old female with a recent diagnosis of invasive ductal carcinoma of the right breast posteriorly in the lateral breast measuring in greatest diameter 1.4 cm on MRI. After extensive preoperative evaluation and discussion detailed elsewhere we elected to proceed with seed localized right breast lumpectomy with right axillary sentinel lymph node biopsy. At the time of seed placement yesterday it migrated about 4 cm away from the tumor and therefore this morning she underwent accurate wire localization prior to the procedure.     Procedure Detail: She was then brought to the holding area and 1 mCi of technetium sulfur colloid was injected intradermally around the right areola prior to procedure.Patient was then brought to the operating room, placed in the supine position on the operating table, and laryngeal mask general anesthesia induced. The neoprobe was used to localize both the radial iodine seed in the breast as well as the sentinel node in the right axilla. Under sterile technique after patient timeout 5 mL of dilute methylene blue was injected subcutaneously beneath the right nipple and massaged for several minutes. Following this the entire breast and axilla and anterior chest and arm were widely sterilely prepped and draped. Patient timeout was again performed and correct procedure verified. The lumpectomy was approached initially. The wire had been placed from an extreme lateral position. The neoprobe was used to localize the seed in the mediolateral dimension and a curvilinear incision was made just lateral to the areola and dissection carried down through the  subcutaneous and breast tissue. The dissection was then widened out laterally and the wire encountered and brought into the incision. At this point there was a small palpable mass near the tip of the wire. A specimen of breast tissue was dissected around the tip of the wire incorporating the mass was excised. This was oriented with ink. Specimen mammography showed the clip and the intact wire and the mass within the specimen although somewhat close to the superior margin. The radioactive seed was able be localized just medial to the lumpectomy site. I then took an additional medial margin which included the seed localized with the probe. This was oriented and specimen x-ray confirmed the seed in the further medial margin. Following this all 5 additional margins were reexcised and oriented with sutures and sent for permanent pathology. Attention was turned to the sentinel lymph node biopsy. A hot area in the right axilla was localized and a small transverse incision made. Dissection was carried down through the simultaneous tissue using cautery and the clavipectoral fascia was opened and the axilla entered. Using the neoprobe for guidance I bluntly dissected down onto a hot blue lymph node which was completely excised and ex vivo had counts of over 2000 with background in the axilla of just over 100. This was sent as hot blue right axillary sentinel lymph node. A second small lymph node with elevated counts in no blue dye was excised and sent as hot non-blue right axillary lymph node. Background in the axilla at this point was less than 10. Both incisions were thoroughly irrigated and hemostasis assured. The lumpectomy cavity was marked with clips. The deep and subcutaneous tissue of both incisions was closed with interrupted 3-0 Vicryl and the skin  with subcuticular 4-0 Monocryl and Dermabond. Pathology called and confirmed receipt of the radioactive seed. Sponge needle and ischemic counts were  correct.    Findings: As above  Estimated Blood Loss:  Minimal         Drains: None  Blood Given: none          Specimens: #1 right breast lumpectomy    #2 through 7 reexcision all margins, oriented    #8 hot blue right axillary sentinel lymph node      #9 hot non-blue right axillary sentinel lymph node        Complications:  * No complications entered in OR log *         Disposition: PACU - hemodynamically stable.         Condition: stable

## 2014-08-29 NOTE — Progress Notes (Signed)
Assisted Dr. Ossey with right, ultrasound guided, pectoralis block. Side rails up, monitors on throughout procedure. See vital signs in flow sheet. Tolerated Procedure well. 

## 2014-08-29 NOTE — Anesthesia Procedure Notes (Addendum)
Anesthesia Regional Block:  Pectoralis block  Pre-Anesthetic Checklist: ,, timeout performed, Correct Patient, Correct Site, Correct Laterality, Correct Procedure, Correct Position, site marked, Risks and benefits discussed,  Surgical consent,  Pre-op evaluation,  At surgeon's request and post-op pain management  Laterality: Right  Prep: chloraprep       Needles:  Injection technique: Single-shot     Needle Length: 9cm 9 cm Needle Gauge: 21 and 21 G    Additional Needles:  Procedures: ultrasound guided (picture in chart) Pectoralis block Narrative:  Start time: 08/29/2014 10:55 AM End time: 08/29/2014 11:10 AM Injection made incrementally with aspirations every 5 mL.  Performed by: Personally   Additional Notes: Monitors applied. Patient sedated. Sterile prep and drape,hand hygiene and sterile gloves were used. Relevant anatomy identified.Needle position confirmed.Local anesthetic injected incrementally after negative aspiration. Vascular puncture avoided. No complications. Image printed for medical record.The patient tolerated the procedure well.       Procedure Name: LMA Insertion Date/Time: 08/29/2014 11:49 AM Performed by: Lyndee Leo Pre-anesthesia Checklist: Patient identified, Emergency Drugs available, Suction available and Patient being monitored Patient Re-evaluated:Patient Re-evaluated prior to inductionOxygen Delivery Method: Circle System Utilized Preoxygenation: Pre-oxygenation with 100% oxygen Intubation Type: IV induction Ventilation: Mask ventilation without difficulty LMA: LMA inserted LMA Size: 4.0 Number of attempts: 1 Airway Equipment and Method: bite block Placement Confirmation: positive ETCO2 Tube secured with: Tape Dental Injury: Teeth and Oropharynx as per pre-operative assessment

## 2014-08-29 NOTE — Anesthesia Preprocedure Evaluation (Signed)
Anesthesia Evaluation  Patient identified by MRN, date of birth, ID band Patient awake    Reviewed: Allergy & Precautions, H&P , NPO status , Patient's Chart, lab work & pertinent test results  Airway Mallampati: I  TM Distance: >3 FB Neck ROM: Full    Dental   Pulmonary          Cardiovascular hypertension, Pt. on medications     Neuro/Psych    GI/Hepatic GERD-  Medicated and Controlled,  Endo/Other    Renal/GU      Musculoskeletal   Abdominal   Peds  Hematology   Anesthesia Other Findings   Reproductive/Obstetrics                             Anesthesia Physical Anesthesia Plan  ASA: II  Anesthesia Plan: General   Post-op Pain Management:    Induction: Intravenous  Airway Management Planned: LMA  Additional Equipment:   Intra-op Plan:   Post-operative Plan: Extubation in OR  Informed Consent: I have reviewed the patients History and Physical, chart, labs and discussed the procedure including the risks, benefits and alternatives for the proposed anesthesia with the patient or authorized representative who has indicated his/her understanding and acceptance.     Plan Discussed with: CRNA and Surgeon  Anesthesia Plan Comments:         Anesthesia Quick Evaluation

## 2014-08-29 NOTE — Transfer of Care (Signed)
Immediate Anesthesia Transfer of Care Note  Patient: Miranda Mueller  Procedure(s) Performed: Procedure(s): SEED LOCALIZED RIGHT BREAST LUMPECTOMY AND RIGHT AXILLARY SENTTINEL LYMPH NODE BIOPSY (Right)  Patient Location: PACU  Anesthesia Type:GA combined with regional for post-op pain  Level of Consciousness: awake, oriented and patient cooperative  Airway & Oxygen Therapy: Patient Spontanous Breathing and Patient connected to face mask oxygen  Post-op Assessment: Report given to PACU RN and Post -op Vital signs reviewed and stable  Post vital signs: Reviewed and stable  Complications: No apparent anesthesia complications

## 2014-08-29 NOTE — Interval H&P Note (Signed)
History and Physical Interval Note:  08/29/2014 11:34 AM  Miranda Mueller  has presented today for surgery, with the diagnosis of cancer right breast  The various methods of treatment have been discussed with the patient and family. After consideration of risks, benefits and other options for treatment, the patient has consented to  Procedure(s): SEED LOCALIZED RIGHT BREAST LUMPECTOMY AND RIGHT AXILLARY SENTTINEL LYMPH NODE BIOPSY (Right) as a surgical intervention .  The patient's history has been reviewed, patient examined, no change in status, stable for surgery.  I have reviewed the patient's chart and labs.  Questions were answered to the patient's satisfaction.     Zackrey Dyar T

## 2014-08-29 NOTE — Discharge Instructions (Signed)
[facility] °Office Phone Number [phone] ° °BREAST BIOPSY/ PARTIAL MASTECTOMY: POST OP INSTRUCTIONS ° °Always review your discharge instruction sheet given to you by the facility where your surgery was performed. ° °IF YOU HAVE DISABILITY OR FAMILY LEAVE FORMS, YOU MUST BRING THEM TO THE OFFICE FOR PROCESSING.  DO NOT GIVE THEM TO YOUR DOCTOR. ° °1. A prescription for pain medication may be given to you upon discharge.  Take your pain medication as prescribed, if needed.  If narcotic pain medicine is not needed, then you may take acetaminophen (Tylenol) or ibuprofen (Advil) as needed. °2. Take your usually prescribed medications unless otherwise directed °3. If you need a refill on your pain medication, please contact your pharmacy.  They will contact our office to request authorization.  Prescriptions will not be filled after 5pm or on week-ends. °4. You should eat very light the first 24 hours after surgery, such as soup, crackers, pudding, etc.  Resume your normal diet the day after surgery. °5. Most patients will experience some swelling and bruising in the breast.  Ice packs and a good support bra will help.  Swelling and bruising can take several days to resolve.  °6. It is common to experience some constipation if taking pain medication after surgery.  Increasing fluid intake and taking a stool softener will usually help or prevent this problem from occurring.  A mild laxative (Milk of Magnesia or Miralax) should be taken according to package directions if there are no bowel movements after 48 hours. °7. Unless discharge instructions indicate otherwise, you may remove your bandages 24-48 hours after surgery, and you may shower at that time.  You may have steri-strips (small skin tapes) in place directly over the incision.  These strips should be left on the skin for 7-10 days.  If your surgeon used skin glue on the incision, you may shower in 24 hours.  The glue will flake off over the  next 2-3 weeks.  Any sutures or staples will be removed at the office during your follow-up visit. °8. ACTIVITIES:  You may resume regular daily activities (gradually increasing) beginning the next day.  Wearing a good support bra or sports bra minimizes pain and swelling.  You may have sexual intercourse when it is comfortable. °a. You may drive when you no longer are taking prescription pain medication, you can comfortably wear a seatbelt, and you can safely maneuver your car and apply brakes. °b. RETURN TO WORK:  ______________________________________________________________________________________ °9. You should see your doctor in the office for a follow-up appointment approximately two weeks after your surgery.  Your doctor’s nurse will typically make your follow-up appointment when she calls you with your pathology report.  Expect your pathology report 2-3 business days after your surgery.  You may call to check if you do not hear from us after three days. °10. OTHER INSTRUCTIONS: _______________________________________________________________________________________________ _____________________________________________________________________________________________________________________________________ °_____________________________________________________________________________________________________________________________________ °_____________________________________________________________________________________________________________________________________ ° °WHEN TO CALL YOUR DOCTOR: °1. Fever over 101.0 °2. Nausea and/or vomiting. °3. Extreme swelling or bruising. °4. Continued bleeding from incision. °5. Increased pain, redness, or drainage from the incision. ° °The clinic staff is available to answer your questions during regular business hours.  Please don’t hesitate to call and ask to speak to one of the nurses for clinical concerns.  If you have a medical emergency, go to the nearest  emergency room or call 911.  A surgeon from [facility] is always on call at the hospital. ° °For further questions, please visit [email or web address]  ° ° °  Post Anesthesia Home Care Instructions ° °Activity: °Get plenty of rest for the remainder of the day. A responsible adult should stay with you for 24 hours following the procedure.  °For the next 24 hours, DO NOT: °-Drive a car °-Operate machinery °-Drink alcoholic beverages °-Take any medication unless instructed by your physician °-Make any legal decisions or sign important papers. ° °Meals: °Start with liquid foods such as gelatin or soup. Progress to regular foods as tolerated. Avoid greasy, spicy, heavy foods. If nausea and/or vomiting occur, drink only clear liquids until the nausea and/or vomiting subsides. Call your physician if vomiting continues. ° °Special Instructions/Symptoms: °Your throat may feel dry or sore from the anesthesia or the breathing tube placed in your throat during surgery. If this causes discomfort, gargle with warm salt water. The discomfort should disappear within 24 hours. ° °

## 2014-08-29 NOTE — Progress Notes (Signed)
Emotional support during breast injections °

## 2014-08-29 NOTE — Anesthesia Postprocedure Evaluation (Signed)
  Anesthesia Post-op Note  Patient: Miranda Mueller  Procedure(s) Performed: Procedure(s): SEED LOCALIZED RIGHT BREAST LUMPECTOMY AND RIGHT AXILLARY SENTTINEL LYMPH NODE BIOPSY (Right)  Patient Location: PACU  Anesthesia Type: General   Level of Consciousness: awake, alert  and oriented  Airway and Oxygen Therapy: Patient Spontanous Breathing  Post-op Pain: mild  Post-op Assessment: Post-op Vital signs reviewed  Post-op Vital Signs: Reviewed  Last Vitals:  Filed Vitals:   08/29/14 1428  BP: 137/60  Pulse: 82  Temp: 36.6 C  Resp: 16    Complications: No apparent anesthesia complications

## 2014-08-29 NOTE — H&P (Signed)
History of Present Illness Marland Kitchen T. Latarra Eagleton MD; 08/22/2014 1:51 PM) The patient is a 73 year old female who presents with breast cancer. Patint is a 73YO post-menopausal female referred by Dr. Marcelo Baldy for evaluation of recently diagnosed carcinoma of the right breast. She recently presented for a screening mamogram revealing a new abnormal density in the posterior right breast. Subsequent imaging included diagnostic mamogram showing a 7 mm irregular mass posteriorly and ultrasound showing aa 7 mm lobulated hypoechoic nodule at the 8:00 position posterior right breast. An ultrasound guided breast biopsy was performed on 08/14/2014 with pathology revealing high-grade invasive carcinoma with spindle, epithelioid and squamous differentiation. . She is seen now in breast multidisciplinary clinic for initial treatment planning. She has experienced no breast symptoms, specifically lump, pain, skin changes or nipple discharge or inversion. She has had a benign cyst excised from her left breast many years ago. subsequent breast MRI has revealed a 1.4 cm area of enhancement and postbiopsy change corresponding to the known malignancy. Findings at that time were the following: Tumor size: 1.4 cm Tumor grade: 3 Estrogen Receptor: very weakly positive at 2% Progesterone Receptor: negative Her-2 neu: negative Lymph node status: negative   Other Problems Marlowe Kays Goettsch; 08/22/2014 7:43 AM) Anxiety Disorder Arthritis Gastroesophageal Reflux Disease High blood pressure Hypercholesterolemia Lump In Breast  Past Surgical History Marlowe Kays Goettsch; 08/22/2014 7:43 AM) Breast Biopsy Left. Cataract Surgery Bilateral. Knee Surgery Right. Oral Surgery Sentinel Lymph Node Biopsy  Diagnostic Studies History Mirian Mo; 08/22/2014 7:43 AM) Colonoscopy 5-10 years ago Mammogram within last year Pap Smear 1-5 years ago  Social History Marlowe Kays Goettsch; 08/22/2014 7:43 AM) Caffeine use  Tea. No alcohol use No drug use Tobacco use Never smoker.  Family History Mirian Mo; 08/22/2014 7:43 AM) Arthritis Mother. Bleeding disorder Brother. Cerebrovascular Accident Father. Cervical Cancer Mother, Sister. Colon Polyps Daughter. Diabetes Mellitus Mother. Heart disease in female family member before age 60 Hypertension Father. Prostate Cancer Brother.  Pregnancy / Birth History Mirian Mo; 08/22/2014 7:43 AM) Age at menarche 27 years. Age of menopause <46 Contraceptive History Oral contraceptives. Gravida 1 Irregular periods Maternal age 35-25 Para 1  Review of Systems Mirian Mo; 08/22/2014 7:43 AM) General Present- Night Sweats. Not Present- Appetite Loss, Chills, Fatigue, Fever, Weight Gain and Weight Loss. Skin Present- Dryness. Not Present- Change in Wart/Mole, Hives, Jaundice, New Lesions, Non-Healing Wounds, Rash and Ulcer. HEENT Present- Seasonal Allergies, Sinus Pain and Wears glasses/contact lenses. Not Present- Earache, Hearing Loss, Hoarseness, Nose Bleed, Oral Ulcers, Ringing in the Ears, Sore Throat, Visual Disturbances and Yellow Eyes. Respiratory Present- Snoring. Not Present- Bloody sputum, Chronic Cough, Difficulty Breathing and Wheezing. Breast Present- Breast Mass. Not Present- Breast Pain, Nipple Discharge and Skin Changes. Cardiovascular Present- Palpitations and Shortness of Breath. Not Present- Chest Pain, Difficulty Breathing Lying Down, Leg Cramps, Rapid Heart Rate and Swelling of Extremities. Gastrointestinal Present- Indigestion. Not Present- Abdominal Pain, Bloating, Bloody Stool, Change in Bowel Habits, Chronic diarrhea, Constipation, Difficulty Swallowing, Excessive gas, Gets full quickly at meals, Hemorrhoids, Nausea, Rectal Pain and Vomiting. Female Genitourinary Not Present- Frequency, Nocturia, Painful Urination, Pelvic Pain and Urgency. Musculoskeletal Present- Joint Stiffness. Not Present- Back Pain,  Joint Pain, Muscle Pain, Muscle Weakness and Swelling of Extremities. Neurological Not Present- Decreased Memory, Fainting, Headaches, Numbness, Seizures, Tingling, Tremor, Trouble walking and Weakness. Psychiatric Present- Anxiety. Not Present- Bipolar, Change in Sleep Pattern, Depression, Fearful and Frequent crying. Endocrine Not Present- Cold Intolerance, Excessive Hunger, Hair Changes, Heat Intolerance, Hot flashes and New Diabetes. Hematology Not Present-  Easy Bruising, Excessive bleeding, Gland problems, HIV and Persistent Infections.   Physical Exam Marland Kitchen T. Lourie Retz MD; 08/22/2014 1:53 PM) The physical exam findings are as follows: Note:General: Alert, moderately obese Caucasian female, in no distress Skin: Warm and dry without rash or infection. HEENT: No palpable masses or thyromegaly. Sclera nonicteric. Pupils equal round and reactive. Oropharynx clear. breasts: Slight bruising lower outer quadrant right breast post biopsy. No palpable masses or other abnormalities in either breast. Lymph nodes: No cervical, supraclavicular, or inguinal nodes palpable. Lungs: Breath sounds clear and equal. No wheezing or increased work of breathing. Cardiovascular: Regular rate and rhythm without murmer. No JVD or edema. Peripheral pulses intact. Abdomen: Nondistended. Soft and nontender. No masses palpable. No organomegaly. No palpable hernias. Extremities: No edema or joint swelling or deformity. No chronic venous stasis changes. Neurologic: Alert and fully oriented. Gait normal. No focal weakness. Psychiatric: Normal mood and affect. Thought content appropriate with normal judgement and insight    Assessment & Plan Marland Kitchen T. Lyrick Worland MD; 08/22/2014 1:59 PM) BREAST CANCER IN SITU, RIGHT (233.0  D05.91) Impression: 73 year old female with a new diagnosis of cancer of the right breast,lower outer quadrant. Clinical stage 1 a, ER very weakly positive at 2%, PR negative, HER-2 negative,  Ki-67 70%. I discussed with the patient and family members present today initial surgical treatment options. We discussed options of breast conservation with lumpectomy or total mastectomy and sentinal lymph node biopsy/dissection. Options for reconstruction were discussed. After discussion they have elected to proceed with rightmph node biopsyomy with right axillary sentinel lymph node biopsy. We discussed the indications and nature of the procedure, and expected recovery, in detail. Surgical risks including anesthetic complications, cardiorespiratory complications, bleeding, infection, wound healing complications, blood clots, lymphedema, local and distant recurrence and possible need for further surgery based on the final pathology was discussed and understood. Chemotherapy, hormonal therapy and radiation therapy have been discussed. They have been provided with literature regarding the treatment of breast cancer. All questions were answered. They understand and agree to proceed and we will go ahead with scheduling. Current Plans  Schedule for Surgery

## 2014-08-30 ENCOUNTER — Encounter (HOSPITAL_BASED_OUTPATIENT_CLINIC_OR_DEPARTMENT_OTHER): Payer: Self-pay | Admitting: General Surgery

## 2014-08-30 ENCOUNTER — Other Ambulatory Visit: Payer: Medicare Other

## 2014-08-31 ENCOUNTER — Encounter: Payer: Self-pay | Admitting: Radiation Oncology

## 2014-08-31 ENCOUNTER — Telehealth (INDEPENDENT_AMBULATORY_CARE_PROVIDER_SITE_OTHER): Payer: Self-pay | Admitting: General Surgery

## 2014-08-31 NOTE — Progress Notes (Signed)
Location of Breast Cancer:Right Breast, Lower Outer Quadrant  Histology per Pathology Report: right Breast  08/29/14 Diagnosis 1. Breast, lumpectomy, Right - HIGH-GRADE INVASIVE MAMMARY CARCINOMA WITH SQUAMOUS DIFFERENTIATION (METAPLASTIC CARCINOMA), SEE COMMENT. - INVASIVE TUMOR IS 2 MM FROM NEAREST MARGIN (ANTERIOR). - NEGATIVE FOR LYMPH VASCULAR INVASION. - PREVIOUS BIOPSY SITE. - SEE TUMOR SYNOPTIC TEMPLATE BELOW. 2. Breast, excision, Right medial margin with radioactive seed - BENIGN BREAST TISSUE, SEE COMMENT. - NEGATIVE FOR ATYPIA OR MALIGNANCY. - SURGICAL MARGIN, NEGATIVE FOR ATYPIA OR MALIGNANCY. 3. Breast, excision, superior margin right breast - BENIGN BREAST TISSUE, SEE COMMENT. - NEGATIVE FOR ATYPIA OR MALIGNANCY. - SURGICAL MARGIN, NEGATIVE FOR ATYPIA OR MALIGNANCY. 4. Breast, excision, posterior margin right breast - BENIGN BREAST TISSUE, SEE COMMENT. - NEGATIVE FOR ATYPIA OR MALIGNANCY. - SURGICAL MARGIN, NEGATIVE FOR ATYPIA OR MALIGNANCY. 5. Breast, excision, lateral margin right breast - BENIGN BREAST TISSUE, SEE COMMENT. - NEGATIVE FOR ATYPIA OR MALIGNANCY. - SURGICAL MARGIN, NEGATIVE FOR ATYPIA OR MALIGNANCY. 6. Breast, excision, inferior margin right breast - BENIGN BREAST TISSUE, SEE COMMENT. - NEGATIVE FOR ATYPIA OR MALIGNANCY. - SURGICAL MARGIN, NEGATIVE FOR ATYPIA OR MALIGNANCY. 7. Breast, excision, anterior margin right breast - BENIGN BREAST TISSUE, SEE COMMENT. - NEGATIVE FOR ATYPIA OR MALIGNANCY. 1 of 4 FINAL for Miranda, Mueller ([id number]) Diagnosis(continued) - SURGICAL MARGIN, NEGATIVE FOR ATYPIA OR MALIGNANCY. 8. Lymph node, sentinel, biopsy, right axillary - ONE LYMPH NODE, NEGATIVE FOR TUMOR (0/1). 9. Lymph node, sentinel, biopsy, right axillary - ONE LYMPH NODE, NEGATIVE FOR TUMOR (0/1).  Receptor Status: ER(2%), PR (0%), Her2-neu (No amplification), Ki-67 (70%),  HER-2 negative ratio (1.57 )  Miranda Mueller presented in 11/15 for  a screening mamogram revealing a new abnormal density in the posterior right breast. Subsequent imaging included diagnostic mamogram showing a 7 mm irregular mass posteriorly and ultrasound showing aa 7 mm lobulated hypoechoic nodule at the 8:00 position posterior right breast. An ultrasound guided breast biopsy was performed on 08/14/2014 with pathology revealing high-grade invasive carcinoma with spindle, epithelioid and squamous differentiation.  Past/Anticipated interventions by surgeon, if EOF:[id number] left Breast with Lymph Node Dissection  Past/Anticipated interventions by medical oncology, if any: Chemotherapy   Lymphedema issues, if any:   Pain issues, if any:  SAFETY ISSUES:  Prior radiation? NO  Pacemaker/ICD?NO  Possible current pregnancy? No  Is the patient on methotrexate? NO  Current Complaints / other details:    She menarched at early age of 3 and went to menopause at age 64  She had 1 pregnancy, her first child was born at age 63  She has received birth control pills for approximately 3-4 years.  Prempro in past - Length of time unknown by patient She has family history of Breast/GYN/GI cancer Mother breast cancer age 23 ; sister breast cancer at age 40    [place], Crista Curb, [place] 08/31/2014,6:31 PM

## 2014-08-31 NOTE — Telephone Encounter (Signed)
Called patient and discussed path report

## 2014-09-05 ENCOUNTER — Ambulatory Visit: Admission: RE | Admit: 2014-09-05 | Payer: Medicare Other | Source: Ambulatory Visit

## 2014-09-05 ENCOUNTER — Ambulatory Visit
Admission: RE | Admit: 2014-09-05 | Discharge: 2014-09-05 | Disposition: A | Discharge status: Home / Self Care | Payer: Medicare Other | Source: Ambulatory Visit | Attending: Radiation Oncology | Admitting: Radiation Oncology

## 2014-09-05 DIAGNOSIS — Z171 Estrogen receptor negative status [ER-]: Secondary | ICD-10-CM | POA: Insufficient documentation

## 2014-09-05 DIAGNOSIS — C50511 Malignant neoplasm of lower-outer quadrant of right female breast: Secondary | ICD-10-CM | POA: Insufficient documentation

## 2014-09-05 DIAGNOSIS — Z79899 Other long term (current) drug therapy: Secondary | ICD-10-CM | POA: Insufficient documentation

## 2014-09-05 DIAGNOSIS — Z510 Encounter for antineoplastic radiation therapy: Secondary | ICD-10-CM | POA: Insufficient documentation

## 2014-09-07 ENCOUNTER — Telehealth (INDEPENDENT_AMBULATORY_CARE_PROVIDER_SITE_OTHER): Payer: Self-pay | Admitting: General Surgery

## 2014-09-07 NOTE — Telephone Encounter (Signed)
Pt having drainage from incision since Wed night.  Drainage less yesterday but worse today.  Comes out near bottom of incision. Drainage is clear to pink tinged.  Denies erythema or pain or fevers.  Sounds like she may have a small draining seroma.  No signs of infection.  Instructed to keep covered and change padding as needed.  Call the office if signs of infection as discussed earlier.  Pt expressed understanding

## 2014-09-10 ENCOUNTER — Other Ambulatory Visit: Payer: Medicare Other

## 2014-09-10 ENCOUNTER — Encounter: Payer: Self-pay | Admitting: Genetic Counselor

## 2014-09-10 ENCOUNTER — Ambulatory Visit (HOSPITAL_BASED_OUTPATIENT_CLINIC_OR_DEPARTMENT_OTHER): Payer: Medicare Other | Admitting: Genetic Counselor

## 2014-09-10 DIAGNOSIS — Z803 Family history of malignant neoplasm of breast: Secondary | ICD-10-CM | POA: Diagnosis not present

## 2014-09-10 DIAGNOSIS — Z8042 Family history of malignant neoplasm of prostate: Secondary | ICD-10-CM

## 2014-09-10 DIAGNOSIS — Z807 Family history of other malignant neoplasms of lymphoid, hematopoietic and related tissues: Secondary | ICD-10-CM | POA: Diagnosis not present

## 2014-09-10 DIAGNOSIS — C50511 Malignant neoplasm of lower-outer quadrant of right female breast: Secondary | ICD-10-CM | POA: Diagnosis present

## 2014-09-10 DIAGNOSIS — Z853 Personal history of malignant neoplasm of breast: Secondary | ICD-10-CM | POA: Diagnosis not present

## 2014-09-10 DIAGNOSIS — Z800 Family history of malignant neoplasm of digestive organs: Secondary | ICD-10-CM | POA: Diagnosis not present

## 2014-09-10 DIAGNOSIS — Z806 Family history of leukemia: Secondary | ICD-10-CM | POA: Diagnosis not present

## 2014-09-10 NOTE — Progress Notes (Signed)
REFERRING PROVIDER: Delia Chimes, NP [address] [place], [place] [address]  PRIMARY PROVIDER:  Delia Chimes, NP  PRIMARY REASON FOR VISIT:  1. Breast cancer of lower-outer quadrant of right female breast   2. Family history of breast cancer   3. Family history of colon cancer   4. Family history of leukemia   5. Family history of lymphoma   6. Family history of prostate cancer      HISTORY OF PRESENT ILLNESS:   Miranda Mueller, a 73 y.o. female, was seen for a [place] cancer genetics consultation at the request of Dr. Toy Cookey due to a personal and family history of cancer.  [name] presents to clinic today to discuss the possibility of a hereditary predisposition to cancer, genetic testing, and to further clarify her future cancer risks, as well as potential cancer risks for family members.   CANCER HISTORY:  $[email or web address]'[email or web address]$ @   Breast cancer of lower-outer quadrant of right female breast   08/14/2014 Initial Diagnosis Right breast high-grade invasive carcinoma spindle, epithelioid and squamous differentiation, ER 2%, PR 0%, HER-2 negative ratio 1.57, Ki-67 70%   08/21/2014 Breast MRI Right breast enhancement at the site of the biopsy 1.2 x 1.2 x 1.4 cm enhancing mass, along lateral margin 1.8 cm hematoma     HISTORY OF PRESENT ILLNESS: In the fall 2015, at the age of 33, [name] was diagnosed with breast cancer of the right breast.  The tumor is essentially a triple negative cancer. This was treated with lumpectomy on August 29, 2014, and she will see the oncologist this week to determine the best treatment plan.    HORMONAL RISK FACTORS:  Menarche was at age 35.  First live birth at age 13.  OCP use for approximately 4 years.  Ovaries intact: yes.  Hysterectomy: yes.  Menopausal status: postmenopausal.  HRT use: 2 years. Colonoscopy: yes; normal. Mammogram within the last year: yes. Number of breast biopsies: 0. Up to date with pelvic exams:  n/a. Any excessive  radiation exposure in the past:  no  Past Medical History  Diagnosis Date  . GERD (gastroesophageal reflux disease)   . IBS (irritable bowel syndrome)   . PVC (premature ventricular contraction)     Hx of  . History of shingles   . Hyperlipidemia   . Hypertension   . Breast cancer 08/29/14    Right Breast -High Grade Invasive Mammary    Past Surgical History  Procedure Laterality Date  . Cataract extraction  2009    bilateral  . Needle core biopsy  right breast Right 08/14/14  . Wisdom tooth extraction    . Knee arthroscopy      right  . Colonoscopy    . Radioactive seed guided mastectomy with axillary sentinel lymph node biopsy Right 08/29/2014    Procedure: SEED LOCALIZED RIGHT BREAST LUMPECTOMY AND RIGHT AXILLARY SENTTINEL LYMPH NODE BIOPSY;  Surgeon: Excell Seltzer, MD;  Location: [place] [facility];  Service: General;  Laterality: Right;    History   Social History  . Marital Status: Married    Spouse Name: Homer    Number of Children: 1  . Years of Education: N/A   Social History Main Topics  . Smoking status: Never Smoker   . Smokeless tobacco: None  . Alcohol Use: No  . Drug Use: No  . Sexual Activity: None   Other Topics Concern  . None   Social History Narrative     FAMILY HISTORY:  We obtained  a detailed, 4-generation family history.  Significant diagnoses are listed below: Family History  Problem Relation Age of Onset  . Hypertension Mother   . Heart disease Mother     CAD  . Breast cancer Mother 58  . Hypertension Sister   . Breast cancer Sister 26  . Hypertension Brother   . Hypertension Brother   . Prostate cancer Brother 60  . Leukemia Maternal Uncle 73  . Heart disease Maternal Grandmother   . Leukemia Maternal Uncle 36  . Breast cancer Cousin     dx under 74  . Colon cancer Cousin     dx in her 37s  . Colon cancer Cousin   . Lymphoma Other 49    NHL  . Colon polyps Daughter     Patient's maternal ancestors are of  [place] descent, and paternal ancestors are of [place] descent. There is no reported Ashkenazi Jewish ancestry. There is no known consanguinity.  GENETIC COUNSELING ASSESSMENT: CARLYE PANAMENO is a 73 y.o. female with a personal history of breast cancer and family history of breast, colon, and prostate cancer along with leukemia and non-hodgkin's lymphoma which somewhat suggestive of a hereditary cancer syndrome and predisposition to cancer. We, therefore, discussed and recommended the following at today's visit.   DISCUSSION: We reviewed the characteristics, features and inheritance patterns of hereditary cancer syndromes. We also discussed genetic testing, including the appropriate family members to test, the process of testing, insurance coverage and turn-around-time for results. We discussed the implications of a negative, positive and/or variant of uncertain significant result. Based on the cancer in her family we discussed BRCA mutations.  We also discussed CHEK2 and Lynch syndrome (although unlikely) associated with breast and colon cancer and ATM mutations associated with breast and leukemia's.  In order to We recommended [name] pursue genetic testing for the ovanext gene panel, in order to get the Lynch genes.   PLAN: After considering the risks, benefits, and limitations,[name]  provided informed consent to pursue genetic testing and the blood sample was sent to [facility] for analysis of the [place]. Results should be available within approximately 3-4 weeks' time, at which point they will be disclosed by telephone to [name], as will any additional recommendations warranted by these results. [name] will receive a summary of her genetic counseling visit and a copy of her results once available. This information will also be available in Epic. We encouraged [name] to remain in contact with cancer genetics annually so that we can continuously update the family history and  inform her of any changes in cancer genetics and testing that may be of benefit for her family. [name] questions were answered to her satisfaction today. Our contact information was provided should additional questions or concerns arise.  Lastly, we encouraged Ms. Villaflor to remain in contact with cancer genetics annually so that we can continuously update the family history and inform her of any changes in cancer genetics and testing that may be of benefit for this family.   Ms.  Berrey questions were answered to her satisfaction today. Our contact information was provided should additional questions or concerns arise. Thank you for the referral and allowing [place] to share in the care of your patient.   Karen P. Florene Glen, [place], [facility] Certified Genetic Counselor [email or web address] phone: [phone]  The patient was seen for a total of 60 minutes in face-to-face genetic counseling.  This patient was discussed with Drs. Magrinat, Lindi Adie and/or Burr Medico who  agrees with the above.    _______________________________________________________________________ For Office Staff:  Number of people involved in session: 2 Was an Intern/ student involved with case: no

## 2014-09-11 ENCOUNTER — Ambulatory Visit (HOSPITAL_BASED_OUTPATIENT_CLINIC_OR_DEPARTMENT_OTHER): Payer: Medicare Other | Admitting: Hematology and Oncology

## 2014-09-11 ENCOUNTER — Telehealth: Payer: Self-pay | Admitting: Hematology and Oncology

## 2014-09-11 VITALS — BP 152/52 | HR 62 | Temp 98.0°F | Resp 20 | Ht 66.0 in | Wt 264.0 lb

## 2014-09-11 DIAGNOSIS — C50511 Malignant neoplasm of lower-outer quadrant of right female breast: Secondary | ICD-10-CM

## 2014-09-11 DIAGNOSIS — Z171 Estrogen receptor negative status [ER-]: Secondary | ICD-10-CM

## 2014-09-11 NOTE — Assessment & Plan Note (Signed)
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 N0 M0 stage IA ER 0%, PR 0%, HER-2 negative, Ki-67 70%  Pathology counseling: I discussed details of pathology report including the significance of squamous metaplasia and I discussed her cancer was triple negative which puts her at a high risk of recurrence.   Patient had clearly mentioned that she does not want to receive chemotherapy unless I can prove that she would have a substantial benefit.given the fact that she 73 years old and her personal wishes, and the fact that this has squamous differentiation, we elected to not pursue chemotherapy. She will go on to radiation treatment.  Since the patient is ER/PR negative, there would be no role of antiestrogen therapy. I briefly discussed the significance of sending him for mammogram and but we elected not to do so because it is very clear that her tumor would be high risk..  I will see her back after radiation is complete for breast exams and surveillance.

## 2014-09-11 NOTE — Telephone Encounter (Signed)
m, °

## 2014-09-11 NOTE — Progress Notes (Signed)
Patient Care Team: Delia Chimes, NP as PCP - General (Nurse Practitioner) Rulon Eisenmenger, MD as Consulting Physician (Hematology and Oncology) Excell Seltzer, MD as Consulting Physician (General Surgery) Eppie Gibson, MD as Attending Physician (Radiation Oncology) Trinda Pascal, NP as Nurse Practitioner (Nurse Practitioner)  DIAGNOSIS: Breast cancer of lower-outer quadrant of right female breast   Staging form: Breast, AJCC 7th Edition     Clinical: Stage IA (T1c, N0, M0) - Unsigned       Staging comments: Staging performed at breast conference on 11.11.15    SUMMARY OF ONCOLOGIC HISTORY:   Breast cancer of lower-outer quadrant of right female breast   08/14/2014 Initial Diagnosis Right breast high-grade invasive carcinoma spindle, epithelioid and squamous differentiation, ER 2%, PR 0%, HER-2 negative ratio 1.57, Ki-67 70%   08/21/2014 Breast MRI Right breast enhancement at the site of the biopsy 1.2 x 1.2 x 1.4 cm enhancing mass, along lateral margin 1.8 cm hematoma   08/29/2014 Surgery Right breast lumpectomy: 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 She is slightly sore from surgery but otherwise doing well. Final pathology revealed high-grade invasive mammary cancer with squamous differentiation. Metaplastic carcinoma. There was no lymphovascular invasion. Margins were negative and 2 sentinel lymph nodes were negative. ER PR was 0%.  REVIEW OF SYSTEMS:   Constitutional: Denies fevers, chills or abnormal weight loss Eyes: Denies blurriness of vision Ears, nose, mouth, throat, and face: Denies  mucositis or sore throat Respiratory: Denies cough, dyspnea or wheezes Cardiovascular: Denies palpitation, chest discomfort or lower extremity swelling Gastrointestinal:  Denies nausea, heartburn or change in bowel habits Skin: Denies abnormal skin rashes Lymphatics: Denies new lymphadenopathy or easy bruising Neurological:Denies numbness, tingling or new weaknesses Behavioral/Psych: Mood is stable, no new changes  Breast: soreness in the breast from recent surgery All other systems were reviewed with the patient and are negative.  I have reviewed the past medical history, past surgical history, social history and family history with the patient and they are unchanged from previous note.  ALLERGIES:  has No Known Allergies.  MEDICATIONS:  Current Outpatient Prescriptions  Medication Sig Dispense Refill  . BENICAR 20 MG tablet Take 1 tablet by mouth  daily 90 tablet 0  . betamethasone dipropionate (DIPROLENE) 0.05 % cream Apply topically as needed.    . cycloSPORINE (RESTASIS) 0.05 % ophthalmic emulsion Place 1 drop into both eyes 2 (two) times daily.      Marland Kitchen esomeprazole (NEXIUM) 40 MG capsule Take 40 mg by mouth as needed.    . hydrochlorothiazide (HYDRODIURIL) 25 MG tablet Take 1 tablet by mouth  daily 90 tablet 0  . Multiple Vitamins-Minerals (HAIR/SKIN/NAILS PO) Take 1 tablet by mouth daily.    . Multiple Vitamins-Minerals (OCUVITE PO) Take 1 tablet by mouth daily.    . Potassium 99 MG TABS Take 1 tablet by mouth daily.       No current facility-administered medications for this visit.    PHYSICAL EXAMINATION: ECOG PERFORMANCE STATUS: 1 - Symptomatic but completely ambulatory  Filed Vitals:   09/11/14 1459  BP: 152/52  Pulse: 62  Temp: 98 F (36.7 C)  Resp: 20   Filed Weights  09/11/14 1459  Weight: 264 lb (119.75 kg)    GENERAL:alert, no distress and comfortable SKIN: skin color, texture, turgor are normal, no rashes or significant lesions EYES: normal,  Conjunctiva are pink and non-injected, sclera clear OROPHARYNX:no exudate, no erythema and lips, buccal mucosa, and tongue normal  NECK: supple, thyroid normal size, non-tender, without nodularity LYMPH:  no palpable lymphadenopathy in the cervical, axillary or inguinal LUNGS: clear to auscultation and percussion with normal breathing effort HEART: regular rate & rhythm and no murmurs and no lower extremity edema ABDOMEN:abdomen soft, non-tender and normal bowel sounds Musculoskeletal:no cyanosis of digits and no clubbing  NEURO: alert & oriented x 3 with fluent speech, no focal motor/sensory deficits  LABORATORY DATA:  I have reviewed the data as listed   Chemistry      Component Value Date/Time   NA 138 08/22/2014 1237   NA 138 08/24/2013 0834   K 3.8 08/22/2014 1237   K 4.0 08/24/2013 0834   CL 102 08/24/2013 0834   CO2 28 08/22/2014 1237   CO2 29 08/24/2013 0834   BUN 16.0 08/22/2014 1237   BUN 15 08/24/2013 0834   CREATININE 1.1 08/22/2014 1237   CREATININE 1.0 08/24/2013 0834      Component Value Date/Time   CALCIUM 9.8 08/22/2014 1237   CALCIUM 9.2 08/24/2013 0834   ALKPHOS 50 08/22/2014 1237   ALKPHOS 47 08/24/2013 0834   AST 17 08/22/2014 1237   AST 21 08/24/2013 0834   ALT 22 08/22/2014 1237   ALT 23 08/24/2013 0834   BILITOT 0.42 08/22/2014 1237   BILITOT 0.8 08/24/2013 0834       Lab Results  Component Value Date   WBC 9.7 08/22/2014   HGB 13.9 08/22/2014   HCT 42.7 08/22/2014   MCV 88.7 08/22/2014   PLT 345 08/22/2014   NEUTROABS 5.5 08/22/2014    ASSESSMENT & PLAN:  Breast cancer of lower-outer quadrant of right female breast 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 N0 M0 stage IA ER 0%, PR 0%, HER-2 negative, Ki-67 70%  Pathology counseling: I discussed details of pathology report including the significance of squamous metaplasia and I discussed her cancer was triple negative which  puts her at a high risk of recurrence.   Patient had clearly mentioned that she does not want to receive chemotherapy unless I can prove that she would have a substantial benefit.given the fact that she 73 years old and her personal wishes, and the fact that this has squamous differentiation, we elected to not pursue chemotherapy. She will go on to radiation treatment.  Since the patient is ER/PR negative, there would be no role of antiestrogen therapy. I briefly discussed the significance of sending him for mammogram and but we elected not to do so because it is very clear that her tumor would be high risk..  I will see her back after radiation is complete for breast exams and surveillance.   No orders of the defined types were placed in this encounter.   The patient has a good understanding of the overall plan. she agrees with it. She will call with any problems that may develop before her next visit here.   Rulon Eisenmenger, MD 09/11/2014 3:40 PM

## 2014-09-11 NOTE — Addendum Note (Signed)
Addended by: Prentiss Bells on: 09/11/2014 06:17 PM   Modules accepted: Orders

## 2014-09-12 ENCOUNTER — Telehealth: Payer: Self-pay | Admitting: Hematology and Oncology

## 2014-09-12 DIAGNOSIS — Z23 Encounter for immunization: Secondary | ICD-10-CM | POA: Diagnosis not present

## 2014-09-12 DIAGNOSIS — I499 Cardiac arrhythmia, unspecified: Secondary | ICD-10-CM | POA: Diagnosis not present

## 2014-09-12 DIAGNOSIS — C50919 Malignant neoplasm of unspecified site of unspecified female breast: Secondary | ICD-10-CM | POA: Diagnosis not present

## 2014-09-12 DIAGNOSIS — I493 Ventricular premature depolarization: Secondary | ICD-10-CM | POA: Diagnosis not present

## 2014-09-12 DIAGNOSIS — I10 Essential (primary) hypertension: Secondary | ICD-10-CM | POA: Diagnosis not present

## 2014-09-12 NOTE — Telephone Encounter (Signed)
, °

## 2014-09-21 ENCOUNTER — Ambulatory Visit
Admission: RE | Admit: 2014-09-21 | Discharge: 2014-09-21 | Disposition: A | Discharge status: Home / Self Care | Payer: Medicare Other | Source: Ambulatory Visit | Attending: Radiation Oncology | Admitting: Radiation Oncology

## 2014-09-21 ENCOUNTER — Encounter: Payer: Self-pay | Admitting: Radiation Oncology

## 2014-09-21 VITALS — BP 153/75 | HR 102 | Temp 97.7°F | Ht 66.0 in | Wt 263.7 lb

## 2014-09-21 DIAGNOSIS — C50511 Malignant neoplasm of lower-outer quadrant of right female breast: Secondary | ICD-10-CM

## 2014-09-21 DIAGNOSIS — Z510 Encounter for antineoplastic radiation therapy: Secondary | ICD-10-CM | POA: Diagnosis not present

## 2014-09-21 DIAGNOSIS — Z171 Estrogen receptor negative status [ER-]: Secondary | ICD-10-CM | POA: Diagnosis not present

## 2014-09-21 DIAGNOSIS — Z79899 Other long term (current) drug therapy: Secondary | ICD-10-CM | POA: Diagnosis not present

## 2014-09-21 NOTE — Progress Notes (Deleted)
Location of Breast Cancer:Right Breast, Lower Outer Quadrant  Histology per Pathology Report: right Breast  08/29/14 Diagnosis 1. Breast, lumpectomy, Right - HIGH-GRADE INVASIVE MAMMARY CARCINOMA WITH SQUAMOUS DIFFERENTIATION (METAPLASTIC CARCINOMA), SEE COMMENT. - INVASIVE TUMOR IS 2 MM FROM NEAREST MARGIN (ANTERIOR). - NEGATIVE FOR LYMPH VASCULAR INVASION. - PREVIOUS BIOPSY SITE. - SEE TUMOR SYNOPTIC TEMPLATE BELOW. 2. Breast, excision, Right medial margin with radioactive seed - BENIGN BREAST TISSUE, SEE COMMENT. - NEGATIVE FOR ATYPIA OR MALIGNANCY. - SURGICAL MARGIN, NEGATIVE FOR ATYPIA OR MALIGNANCY. 3. Breast, excision, superior margin right breast - BENIGN BREAST TISSUE, SEE COMMENT. - NEGATIVE FOR ATYPIA OR MALIGNANCY. - SURGICAL MARGIN, NEGATIVE FOR ATYPIA OR MALIGNANCY. 4. Breast, excision, posterior margin right breast - BENIGN BREAST TISSUE, SEE COMMENT. - NEGATIVE FOR ATYPIA OR MALIGNANCY. - SURGICAL MARGIN, NEGATIVE FOR ATYPIA OR MALIGNANCY. 5. Breast, excision, lateral margin right breast - BENIGN BREAST TISSUE, SEE COMMENT. - NEGATIVE FOR ATYPIA OR MALIGNANCY. - SURGICAL MARGIN, NEGATIVE FOR ATYPIA OR MALIGNANCY. 6. Breast, excision, inferior margin right breast - BENIGN BREAST TISSUE, SEE COMMENT. - NEGATIVE FOR ATYPIA OR MALIGNANCY. - SURGICAL MARGIN, NEGATIVE FOR ATYPIA OR MALIGNANCY. 7. Breast, excision, anterior margin right breast - BENIGN BREAST TISSUE, SEE COMMENT. - NEGATIVE FOR ATYPIA OR MALIGNANCY. 1 of 4 FINAL for Miranda Mueller, Miranda Mueller ([id number]) Diagnosis(continued) - SURGICAL MARGIN, NEGATIVE FOR ATYPIA OR MALIGNANCY. 8. Lymph node, sentinel, biopsy, right axillary - ONE LYMPH NODE, NEGATIVE FOR TUMOR (0/1). 9. Lymph node, sentinel, biopsy, right axillary - ONE LYMPH NODE, NEGATIVE FOR TUMOR (0/1).  Receptor Status: ER(2%), PR (0%), Her2-neu (No amplification), Ki-67 (70%),  HER-2 negative ratio (1.57 )  Miranda Mueller presented in 11/15 for  a screening mamogram revealing a new abnormal density in the posterior right breast. Subsequent imaging included diagnostic mamogram showing a 7 mm irregular mass posteriorly and ultrasound showing aa 7 mm lobulated hypoechoic nodule at the 8:00 position posterior right breast. An ultrasound guided breast biopsy was performed on 08/14/2014 with pathology revealing high-grade invasive carcinoma with spindle, epithelioid and squamous differentiation.  Past/Anticipated interventions by surgeon, if DVV:[id number] left Breast with Lymph Node Dissection  Past/Anticipated interventions by medical oncology, if any: Chemotherapy   Lymphedema issues, if any:   Pain issues, if any:  SAFETY ISSUES:  Prior radiation? NO  Pacemaker/ICD?NO  Possible current pregnancy? No  Is the patient on methotrexate? NO  Current Complaints / other details:    She menarched at early age of 24 and went to menopause at age 59  She had 1 pregnancy, her first child was born at age 26  She has received birth control pills for approximately 3-4 years.  She was never exposed to fertility medications or hormone replacement therapy.  She has family history of Breast/GYN/GI cancer Mother breast cancer age 74 ; sister breast cancer at age 64    [place], Miranda Gage, RN 09/21/2014,1:31 PM

## 2014-09-21 NOTE — Progress Notes (Signed)
SIMULATION AND TREATMENT PLANNING NOTE  Outpatient  DIAGNOSIS:     ICD-9-CM ICD-10-CM   1. Breast cancer of lower-outer quadrant of right female breast 174.5 C50.511      NARRATIVE:  The patient was brought to the [facility].  Identity was confirmed.  All relevant records and images related to the planned course of therapy were reviewed.  The patient freely provided informed written consent to proceed with treatment after reviewing the details related to the planned course of therapy. The consent form was witnessed and verified by the simulation staff.    Then, the patient was set-up in a stable reproducible  supine position for radiation therapy - right arm, head in vaclock.  CT images were obtained.  Surface markings were placed.  The CT images were loaded into the planning software.     TREATMENT PLANNING NOTE: Treatment planning then occurred.  The radiation prescription was entered and confirmed.    A total of 3 medically necessary complex treatment devices were fabricated and supervised by me - 2 tangential fields (  tangents) with MLCs to block lung/heart, and vaclock device. I have requested : 3D Simulation.  I have requested a DVH of the following structures: lungs, heart, lumpectomy cavity.     The patient will receive 50.4 Gy in 28 fraction to the right breast with tangents; this will be followed by a boost to the lumpectomy cavity of 10 Gy in 5 fractions.   -----------------------------------  Eppie Gibson, MD

## 2014-09-21 NOTE — Progress Notes (Signed)
Miranda Mueller here for assessment to determine if she needs radiation therapy for Invasive Mammary Carcinoma of the right breast.  She reports tenderness of a level 4/10, but no other overt pain. She has good ROM in her right arm.

## 2014-09-21 NOTE — Progress Notes (Signed)
Radiation Oncology         ([phone]) [phone] ________________________________  Name: Miranda Mueller MRN: [id number]  Date: 09/21/2014  DOB: 1941-09-20  Follow-Up Visit Note  Outpatient  Referring: Excell Seltzer MD  CC: Delia Chimes, NP  Rulon Eisenmenger, MD  Diagnosis and Prior Radiotherapy:    ICD-9-CM ICD-10-CM   1. Breast cancer of lower-outer quadrant of right female breast 174.5 C50.511    Right Breast LOQ [id number] Stage I Grade III INVASIVE MAMMARY CARCINOMA WITH SQUAMOUS DIFFERENTIATION (METAPLASTIC CARCINOMA), Triple negative   Narrative:  The patient returns today for routine follow-up.  She elected NOT to pursue chemotherapy.   Lumpectomy revealed pathology as above on 11/18.  Margins are clear by at least 69mm. Tumor was 1.5cm and there was no LVSI. She denies any drainage from her right breast for the past 1-2 weeks.  She is in her USOH.  Denies prior RT.                 ALLERGIES:  has No Known Allergies.  Meds: Current Outpatient Prescriptions  Medication Sig Dispense Refill  . BENICAR 20 MG tablet Take 1 tablet by mouth  daily 90 tablet 0  . betamethasone dipropionate (DIPROLENE) 0.05 % cream Apply topically as needed.    . cycloSPORINE (RESTASIS) 0.05 % ophthalmic emulsion Place 1 drop into both eyes 2 (two) times daily.      Marland Kitchen esomeprazole (NEXIUM) 40 MG capsule Take 40 mg by mouth as needed.    . hydrochlorothiazide (HYDRODIURIL) 25 MG tablet Take 1 tablet by mouth  daily 90 tablet 0  . Multiple Vitamins-Minerals (HAIR/SKIN/NAILS PO) Take 1 tablet by mouth daily.    . Potassium 99 MG TABS Take 1 tablet by mouth daily.       No current facility-administered medications for this encounter.    Physical Findings: The patient is in no acute distress. Patient is alert and oriented.  height is 5\' 6"  (1.676 m) and weight is 263 lb 11.2 oz (119.614 kg). Her temperature is 97.7 F (36.5 C). Her blood pressure is 153/75 and her pulse is 102. .   No neck adenopathy or  SCV adenopathy.  Well appearing. Right breast has healed well from surgery with scabbing over lumpectomy scar. No sign of infection. Skin healthy appearing over breast.   Lab Findings: Lab Results  Component Value Date   WBC 9.7 08/22/2014   HGB 13.9 08/22/2014   HCT 42.7 08/22/2014   MCV 88.7 08/22/2014   PLT 345 08/22/2014    Radiographic Findings: Nm Sentinel Node Inj-no Rpt (breast)  08/29/2014   CLINICAL DATA: right breast cancer   Sulfur colloid was injected intradermally by the nuclear medicine  technologist for breast cancer sentinel node localization.     Impression/Plan: It was a pleasure meeting the patient again today. We discussed the risks, benefits, and side effects of radiotherapy. Radiotherapy to the right breast should decrease the risk of locoregional recurrence by 2/3 with a small survival benefit. We discussed that radiation would take approximately 7 weeks to complete. We spoke about acute effects including skin irritation and fatigue as well as much less common late effects including heart irritation. We spoke about the latest technology that is used to minimize the risk of late effects for breast cancer patients undergoing radiotherapy. No guarantees of treatment were given. The patient is enthusiastic about proceeding with treatment. I look forward to participating in the patient's care.  Consent was signed today. Proceed with simulation today.  ____________________________   Eppie Gibson, MD

## 2014-09-24 DIAGNOSIS — I10 Essential (primary) hypertension: Secondary | ICD-10-CM | POA: Diagnosis not present

## 2014-09-24 DIAGNOSIS — R5383 Other fatigue: Secondary | ICD-10-CM | POA: Diagnosis not present

## 2014-09-24 DIAGNOSIS — I493 Ventricular premature depolarization: Secondary | ICD-10-CM | POA: Diagnosis not present

## 2014-09-24 DIAGNOSIS — R5381 Other malaise: Secondary | ICD-10-CM | POA: Diagnosis not present

## 2014-09-25 ENCOUNTER — Other Ambulatory Visit: Payer: Self-pay | Admitting: Family Medicine

## 2014-09-26 DIAGNOSIS — Z171 Estrogen receptor negative status [ER-]: Secondary | ICD-10-CM | POA: Diagnosis not present

## 2014-09-26 DIAGNOSIS — C50511 Malignant neoplasm of lower-outer quadrant of right female breast: Secondary | ICD-10-CM | POA: Diagnosis not present

## 2014-09-26 DIAGNOSIS — I493 Ventricular premature depolarization: Secondary | ICD-10-CM | POA: Diagnosis not present

## 2014-09-26 DIAGNOSIS — Z79899 Other long term (current) drug therapy: Secondary | ICD-10-CM | POA: Diagnosis not present

## 2014-09-26 DIAGNOSIS — Z510 Encounter for antineoplastic radiation therapy: Secondary | ICD-10-CM | POA: Diagnosis not present

## 2014-09-26 DIAGNOSIS — I10 Essential (primary) hypertension: Secondary | ICD-10-CM | POA: Diagnosis not present

## 2014-09-28 ENCOUNTER — Ambulatory Visit
Admission: RE | Admit: 2014-09-28 | Discharge: 2014-09-28 | Disposition: A | Discharge status: Home / Self Care | Payer: Medicare Other | Source: Ambulatory Visit | Attending: Radiation Oncology | Admitting: Radiation Oncology

## 2014-09-28 ENCOUNTER — Ambulatory Visit: Payer: Medicare Other | Admitting: Radiation Oncology

## 2014-09-28 DIAGNOSIS — Z510 Encounter for antineoplastic radiation therapy: Secondary | ICD-10-CM | POA: Diagnosis not present

## 2014-09-28 DIAGNOSIS — C50511 Malignant neoplasm of lower-outer quadrant of right female breast: Secondary | ICD-10-CM | POA: Diagnosis not present

## 2014-09-28 DIAGNOSIS — Z171 Estrogen receptor negative status [ER-]: Secondary | ICD-10-CM | POA: Diagnosis not present

## 2014-09-28 DIAGNOSIS — Z79899 Other long term (current) drug therapy: Secondary | ICD-10-CM | POA: Diagnosis not present

## 2014-10-01 ENCOUNTER — Encounter: Payer: Self-pay | Admitting: Radiation Oncology

## 2014-10-01 ENCOUNTER — Ambulatory Visit
Admission: RE | Admit: 2014-10-01 | Discharge: 2014-10-01 | Disposition: A | Discharge status: Home / Self Care | Payer: Medicare Other | Source: Ambulatory Visit | Attending: Radiation Oncology | Admitting: Radiation Oncology

## 2014-10-01 ENCOUNTER — Ambulatory Visit (INDEPENDENT_AMBULATORY_CARE_PROVIDER_SITE_OTHER): Payer: Medicare Other | Admitting: Neurology

## 2014-10-01 ENCOUNTER — Ambulatory Visit: Payer: Medicare Other

## 2014-10-01 ENCOUNTER — Encounter: Payer: Self-pay | Admitting: Neurology

## 2014-10-01 VITALS — BP 152/74 | HR 77 | Temp 97.5°F | Resp 12 | Wt 265.1 lb

## 2014-10-01 VITALS — BP 131/68 | HR 70 | Temp 96.9°F | Ht 66.5 in | Wt 264.0 lb

## 2014-10-01 DIAGNOSIS — E669 Obesity, unspecified: Secondary | ICD-10-CM | POA: Diagnosis not present

## 2014-10-01 DIAGNOSIS — R0683 Snoring: Secondary | ICD-10-CM

## 2014-10-01 DIAGNOSIS — I493 Ventricular premature depolarization: Secondary | ICD-10-CM

## 2014-10-01 DIAGNOSIS — Z79899 Other long term (current) drug therapy: Secondary | ICD-10-CM | POA: Diagnosis not present

## 2014-10-01 DIAGNOSIS — C50911 Malignant neoplasm of unspecified site of right female breast: Secondary | ICD-10-CM

## 2014-10-01 DIAGNOSIS — R351 Nocturia: Secondary | ICD-10-CM

## 2014-10-01 DIAGNOSIS — Z510 Encounter for antineoplastic radiation therapy: Secondary | ICD-10-CM | POA: Diagnosis not present

## 2014-10-01 DIAGNOSIS — C50511 Malignant neoplasm of lower-outer quadrant of right female breast: Secondary | ICD-10-CM | POA: Insufficient documentation

## 2014-10-01 DIAGNOSIS — Z171 Estrogen receptor negative status [ER-]: Secondary | ICD-10-CM | POA: Diagnosis not present

## 2014-10-01 MED ORDER — RADIAPLEXRX EX GEL
Freq: Once | CUTANEOUS | Status: AC
  Administered 2014-10-01: 13:00:00 via TOPICAL

## 2014-10-01 MED ORDER — ALRA NON-METALLIC DEODORANT (RAD-ONC)
1.0000 "application " | Freq: Once | TOPICAL | Status: AC
  Administered 2014-10-01: 1 via TOPICAL

## 2014-10-01 MED ORDER — RADIAPLEXRX EX GEL
Freq: Once | CUTANEOUS | Status: AC
  Administered 2014-10-01: 14:00:00 via TOPICAL

## 2014-10-01 NOTE — Progress Notes (Signed)
Subjective:    Patient ID: [name] is a 73 y.o. female.  HPI    Star Age, MD, PhD [facility] [address] [place], [place] [address]  Dear Ulice Dash,   I saw your patient, Miranda Mueller, upon your kind request in my neurologic clinic today for initial consultation of her sleep disorder, in particular, concern for underlying obstructive sleep apnea. The patient is accompanied by her husband today. As you know, Miranda Mueller is a very pleasant 73 year old right-handed woman with an underlying medical history of reflux disease, hypertension, breast cancer (s/p lumpectomy 08/29/14), bradycardia and PVCs and morbid obesity, who reports snoring and daytime tiredness. She is to start XRT this week for her breast cancer. She is scheduled for an echocardiogram for this week and a stress test for next.  She goes to bed around 11 PM and falls asleep quickly. She wakes up a few times per night and typically has to go to the bathroom twice per night. She likes to sleep on her stomach and denies morning headaches. She denies restless leg symptoms and is not known to twitch or kick in her sleep. She has no family history of obstructive sleep apnea. She has a rise time of around 8 AM and typically feels adequately rested. She does not nap typically. She has never fallen asleep while driving. Her Epworth sleepiness score is 3 out of 24 today. Her husband noticed her snoring as well as occasional breathing pauses while asleep. She denies chest pain but has had some shortness of breath while awake. She has also woken up with a sense of having to breathe deeply. She has no pets in her bedroom and does not watch TV in bed. She consumes one sweet tea per day typically. She drinks decaf coffee, does not drink any alcohol and does not smoke.   Her Past Medical History Is Significant For: Past Medical History  Diagnosis Date  . GERD (gastroesophageal reflux disease)   . IBS  (irritable bowel syndrome)   . PVC (premature ventricular contraction)     Hx of  . History of shingles   . Hyperlipidemia   . Hypertension   . Breast cancer 08/29/14    Right Breast -High Grade Invasive Mammary  . Bradycardia   . Multifocal PVCs     Her Past Surgical History Is Significant For: Past Surgical History  Procedure Laterality Date  . Cataract extraction  2009    bilateral  . Needle core biopsy  right breast Right 08/14/14  . Wisdom tooth extraction    . Knee arthroscopy      right  . Colonoscopy  2004    normal  . Radioactive seed guided mastectomy with axillary sentinel lymph node biopsy Right 08/29/2014    Procedure: SEED LOCALIZED RIGHT BREAST LUMPECTOMY AND RIGHT AXILLARY SENTTINEL LYMPH NODE BIOPSY;  Surgeon: Excell Seltzer, MD;  Location: [facility];  Service: General;  Laterality: Right;  . Breast lumpectomy Right 08/29/14    started radiation 10/01/14  . Eye surgery      2009,2010  . Other surgical history Left 1997    breast,cyst    Her Family History Is Significant For: Family History  Problem Relation Age of Onset  . Hypertension Mother   . Heart disease Mother     CAD  . Breast cancer Mother 85  . Hypertension Sister   . Breast cancer Sister 73  . Hypertension Brother   . Hypertension  Brother   . Prostate cancer Brother 81  . Leukemia Maternal Uncle 45  . Heart disease Maternal Grandmother   . Leukemia Maternal Uncle 35  . Breast cancer Cousin     dx under 7  . Colon cancer Cousin     dx in her 51s  . Colon cancer Cousin   . Lymphoma Other 49    NHL  . Colon polyps Daughter   . Aneurysm Sister   . Heart disease Sister   . Stroke Father     Her Social History Is Significant For: History   Social History  . Marital Status: Married    Spouse Name: Homer    Number of Children: 1  . Years of Education: 12   Occupational History  .      retired   Social History Main Topics  . Smoking status: Never Smoker    . Smokeless tobacco: Never Used  . Alcohol Use: No  . Drug Use: No  . Sexual Activity: None   Other Topics Concern  . None   Social History Narrative   Consumes one glass of caffeine daily    Her Allergies Are:  No Known Allergies:   Her Current Medications Are:  Outpatient Encounter Prescriptions as of 10/01/2014  Medication Sig  . BENICAR 20 MG tablet Take 1 tablet by mouth  daily  . betamethasone dipropionate (DIPROLENE) 0.05 % cream Apply topically as needed.  . cycloSPORINE (RESTASIS) 0.05 % ophthalmic emulsion Place 1 drop into both eyes 2 (two) times daily.    Marland Kitchen esomeprazole (NEXIUM) 40 MG capsule Take 40 mg by mouth as needed.  . hydrochlorothiazide (HYDRODIURIL) 25 MG tablet Take 1 tablet by mouth  daily  . Multiple Vitamins-Minerals (HAIR/SKIN/NAILS PO) Take 1 tablet by mouth daily.  . Potassium 99 MG TABS Take 1 tablet by mouth daily.    :  Review of Systems:  Out of a complete 14 point review of systems, all are reviewed and negative with the exception of these symptoms as listed below:   Review of Systems  Constitutional: Positive for fatigue.  Respiratory: Positive for shortness of breath.   Musculoskeletal:       Joint pain  Neurological:       Snoring    Objective:  Neurologic Exam  Physical Exam Physical Examination:   Filed Vitals:   10/01/14 0940  BP: 131/68  Pulse: 70  Temp: 96.9 F (36.1 C)    General Examination: The patient is a very pleasant 73 y.o. female in no acute distress. She appears well-developed and well-nourished and very well groomed.   HEENT: Normocephalic, atraumatic, pupils are equal, round and reactive to light and accommodation. Funduscopic exam is normal with sharp disc margins noted. Extraocular tracking is good without limitation to gaze excursion or nystagmus noted. Normal smooth pursuit is noted. Hearing is grossly intact. Tympanic membranes are clear bilaterally. Face is symmetric with normal facial animation and  normal facial sensation. Speech is clear with no dysarthria noted. There is no hypophonia. There is no lip, neck/head, jaw or voice tremor. Neck is supple with full range of passive and active motion. There are no carotid bruits on auscultation. Oropharynx exam reveals: mild mouth dryness, adequate dental hygiene and moderate airway crowding, due to narrow airway entry, tonsils of 2+, redundant soft palate. Mallampati is class II. Neck size is 15 inches.  Chest: Clear to auscultation without wheezing, rhonchi or crackles noted.  Heart: S1+S2+0, regular and normal without murmurs, rubs or  gallops noted.   Abdomen: Soft, non-tender and non-distended with normal bowel sounds appreciated on auscultation.  Extremities: There is no pitting edema in the distal lower extremities bilaterally. Pedal pulses are intact.  Skin: Warm and dry without trophic changes noted. There are no varicose veins.  Musculoskeletal: exam reveals no obvious joint deformities, tenderness or joint swelling or erythema.   Neurologically:  Mental status: The patient is awake, alert and oriented in all 4 spheres. Her immediate and remote memory, attention, language skills and fund of knowledge are appropriate. There is no evidence of aphasia, agnosia, apraxia or anomia. Speech is clear with normal prosody and enunciation. Thought process is linear. Mood is normal and affect is normal.  Cranial nerves II - XII are as described above under HEENT exam. In addition: shoulder shrug is normal with equal shoulder height noted. Motor exam: Normal bulk, strength and tone is noted. There is no drift, tremor or rebound. Romberg is negative. Reflexes are 2+ throughout. Babinski: Toes are flexor bilaterally. Fine motor skills and coordination: intact with normal finger taps, normal hand movements, normal rapid alternating patting, normal foot taps and normal foot agility.  Cerebellar testing: No dysmetria or intention tremor on finger to nose  testing. Heel to shin is unremarkable bilaterally. There is no truncal or gait ataxia.  Sensory exam: intact to light touch, pinprick, vibration, temperature sense in the upper and lower extremities.  Gait, station and balance: She stands easily. No veering to one side is noted. No leaning to one side is noted. Posture is age-appropriate and stance is narrow based. Gait shows normal stride length and normal pace. No problems turning are noted. She turns en bloc. Tandem walk is unremarkable.  Assessment and Plan:   In summary, Miranda Mueller is a very pleasant 73 y.o.-year old female with an underlying medical history of reflux disease, hypertension, breast cancer (s/p lumpectomy 08/29/14), bradycardia and PVCs and morbid obesity, who reports snoring and daytime tiredness. While she attributes some of her recent daytime tiredness to her cancer diagnosis and treatment, her history and physical exam are indeed concerning for underlying obstructive sleep apnea (OSA). I had a long chat with the patient and her husband about my findings and the diagnosis of OSA, its prognosis and treatment options. We talked about medical treatments, surgical interventions and non-pharmacological approaches. I explained in particular the risks and ramifications of untreated moderate to severe OSA, especially with respect to developing cardiovascular disease down the Road, including congestive heart failure, difficult to treat hypertension, cardiac arrhythmias, or stroke. Even type 2 diabetes has, in part, been linked to untreated OSA. Symptoms of untreated OSA include daytime sleepiness, memory problems, mood irritability and mood disorder such as depression and anxiety, lack of energy, as well as recurrent headaches, especially morning headaches. We talked about trying to maintain a healthy lifestyle in general, as well as the importance of weight control. I encouraged the patient to eat healthy, exercise daily and keep well  hydrated, to keep a scheduled bedtime and wake time routine, to not skip any meals and eat healthy snacks in between meals. I advised the patient not to drive when feeling sleepy. I recommended the following at this time: sleep study with potential positive airway pressure titration. (We will score hypopneas at 4% and split the sleep study into diagnostic and treatment portion, if the estimated. 2 hour AHI is >15/h).   I explained the sleep test procedure to the patient and also outlined possible surgical and non-surgical treatment  options of OSA, including the use of a custom-made dental device (which would require a referral to a specialist dentist or oral surgeon), upper airway surgical options, such as pillar implants, radiofrequency surgery, tongue base surgery, and UPPP (which would involve a referral to an ENT surgeon). Rarely, jaw surgery such as mandibular advancement may be considered.  I also explained the CPAP treatment option to the patient, who indicated that she would be willing to try CPAP if the need arises. I explained the importance of being compliant with PAP treatment, not only for insurance purposes but primarily to improve Her symptoms, and for the patient's long term health benefit, including to reduce Her cardiovascular risks. I answered all their questions today and the patient and her husband were in agreement. I would like to see her back after the sleep study is completed and encouraged her to call with any interim questions, concerns, problems or updates.   Thank you very much for allowing me to participate in the care of this nice patient. If I can be of any further assistance to you please do not hesitate to call me at [phone].  Sincerely,   Star Age, MD, PhD

## 2014-10-01 NOTE — Progress Notes (Signed)
   Weekly Management Note:  outpatient    ICD-9-CM ICD-10-CM   1. Breast cancer of lower-outer quadrant of right female breast 174.5 C50.511 hyaluronate sodium (RADIAPLEXRX) gel     non-metallic deodorant (ALRA) 1 application    Current Dose:  1.8 Gy  Projected Dose: 50.4 Gy initial   Narrative:  The patient presents for routine under treatment assessment.  CBCT/MVCT images/Port film x-rays were reviewed.  The chart was checked. Doing well  Physical Findings:  weight is 265 lb 1.6 oz (120.249 kg). Her oral temperature is 97.5 F (36.4 C). Her blood pressure is 152/74 and her pulse is 77. Her respiration is 12 and oxygen saturation is 96%.  no skin changes  Impression:  The patient is tolerating radiotherapy.  Plan:  Continue radiotherapy as planned.   ________________________________   Eppie Gibson, M.D.

## 2014-10-01 NOTE — Progress Notes (Signed)
She is currently in no pain. Pt complains of Fatigue.  Pt right breast warm, dry and intact.  Noted an quarter sized area of redness under right breast fold (left side) possibly due to bra.  Noted incision line is well approximated, noted dryness to site. Denies edema.

## 2014-10-01 NOTE — Addendum Note (Signed)
Encounter addended by: Jenene Slicker, RN on: 10/01/2014  2:24 PM<BR>     Documentation filed: [employer], Inpatient MAR, Orders

## 2014-10-01 NOTE — Progress Notes (Signed)
Pt here for patient teaching.  Radiation and You booklet given with areas of pertinence flagged and highlighted.  Reviewed fatigue, hair loss to treatment area, skin care/changes. Radiaplex and Alra given with instruction. Pt able to give teach back of frequency of cream, to not apply within 4 hrs of treatment, to avoid shaving, and to pat skin dry. Pt demonstrated understanding of information given and will contact nursing with any questions or concerns.

## 2014-10-01 NOTE — Patient Instructions (Signed)

## 2014-10-02 ENCOUNTER — Ambulatory Visit: Payer: Medicare Other

## 2014-10-02 ENCOUNTER — Ambulatory Visit
Admission: RE | Admit: 2014-10-02 | Discharge: 2014-10-02 | Disposition: A | Discharge status: Home / Self Care | Payer: Medicare Other | Source: Ambulatory Visit | Attending: Radiation Oncology | Admitting: Radiation Oncology

## 2014-10-02 DIAGNOSIS — Z171 Estrogen receptor negative status [ER-]: Secondary | ICD-10-CM | POA: Diagnosis not present

## 2014-10-02 DIAGNOSIS — C50511 Malignant neoplasm of lower-outer quadrant of right female breast: Secondary | ICD-10-CM | POA: Diagnosis not present

## 2014-10-02 DIAGNOSIS — Z510 Encounter for antineoplastic radiation therapy: Secondary | ICD-10-CM | POA: Diagnosis not present

## 2014-10-02 DIAGNOSIS — Z79899 Other long term (current) drug therapy: Secondary | ICD-10-CM | POA: Diagnosis not present

## 2014-10-03 ENCOUNTER — Ambulatory Visit: Payer: Medicare Other

## 2014-10-03 ENCOUNTER — Ambulatory Visit
Admission: RE | Admit: 2014-10-03 | Discharge: 2014-10-03 | Disposition: A | Discharge status: Home / Self Care | Payer: Medicare Other | Source: Ambulatory Visit | Attending: Radiation Oncology | Admitting: Radiation Oncology

## 2014-10-03 DIAGNOSIS — I493 Ventricular premature depolarization: Secondary | ICD-10-CM | POA: Diagnosis not present

## 2014-10-03 DIAGNOSIS — I10 Essential (primary) hypertension: Secondary | ICD-10-CM | POA: Diagnosis not present

## 2014-10-03 DIAGNOSIS — C50511 Malignant neoplasm of lower-outer quadrant of right female breast: Secondary | ICD-10-CM | POA: Diagnosis not present

## 2014-10-03 DIAGNOSIS — Z79899 Other long term (current) drug therapy: Secondary | ICD-10-CM | POA: Diagnosis not present

## 2014-10-03 DIAGNOSIS — Z171 Estrogen receptor negative status [ER-]: Secondary | ICD-10-CM | POA: Diagnosis not present

## 2014-10-03 DIAGNOSIS — Z510 Encounter for antineoplastic radiation therapy: Secondary | ICD-10-CM | POA: Diagnosis not present

## 2014-10-03 NOTE — Addendum Note (Signed)
Encounter addended by: Deirdre Evener, RN on: 10/03/2014 11:51 AM<BR>     Documentation filed: Inpatient Patient Education

## 2014-10-04 ENCOUNTER — Ambulatory Visit
Admission: RE | Admit: 2014-10-04 | Discharge: 2014-10-04 | Disposition: A | Discharge status: Home / Self Care | Payer: Medicare Other | Source: Ambulatory Visit | Attending: Radiation Oncology | Admitting: Radiation Oncology

## 2014-10-04 ENCOUNTER — Ambulatory Visit: Payer: Medicare Other

## 2014-10-04 DIAGNOSIS — Z79899 Other long term (current) drug therapy: Secondary | ICD-10-CM | POA: Diagnosis not present

## 2014-10-04 DIAGNOSIS — Z510 Encounter for antineoplastic radiation therapy: Secondary | ICD-10-CM | POA: Diagnosis not present

## 2014-10-04 DIAGNOSIS — Z171 Estrogen receptor negative status [ER-]: Secondary | ICD-10-CM | POA: Diagnosis not present

## 2014-10-04 DIAGNOSIS — C50511 Malignant neoplasm of lower-outer quadrant of right female breast: Secondary | ICD-10-CM | POA: Diagnosis not present

## 2014-10-08 ENCOUNTER — Ambulatory Visit
Admission: RE | Admit: 2014-10-08 | Discharge: 2014-10-08 | Disposition: A | Discharge status: Home / Self Care | Payer: Medicare Other | Source: Ambulatory Visit | Attending: Radiation Oncology | Admitting: Radiation Oncology

## 2014-10-08 ENCOUNTER — Encounter: Payer: Self-pay | Admitting: Radiation Oncology

## 2014-10-08 ENCOUNTER — Ambulatory Visit: Payer: Medicare Other

## 2014-10-08 VITALS — BP 160/91 | HR 143 | Resp 16 | Wt 262.2 lb

## 2014-10-08 DIAGNOSIS — C50511 Malignant neoplasm of lower-outer quadrant of right female breast: Secondary | ICD-10-CM

## 2014-10-08 DIAGNOSIS — Z510 Encounter for antineoplastic radiation therapy: Secondary | ICD-10-CM | POA: Diagnosis not present

## 2014-10-08 DIAGNOSIS — Z171 Estrogen receptor negative status [ER-]: Secondary | ICD-10-CM | POA: Diagnosis not present

## 2014-10-08 DIAGNOSIS — Z79899 Other long term (current) drug therapy: Secondary | ICD-10-CM | POA: Diagnosis not present

## 2014-10-08 NOTE — Progress Notes (Signed)
Patient reports using alra and radiaplex as directed. Patient denies skin changes to right/treated breast. Denise fatigue or breast pain. Heart rate and blood pressure elevated. Patient following up with cardiologist and scheduled for stress test next week.

## 2014-10-08 NOTE — Progress Notes (Signed)
Weekly Management Note:  Site: Right breast Current Dose:  900  cGy Projected Dose: 5040  cGy  Narrative: The patient is seen today for routine under treatment assessment. CBCT/MVCT images/port films were reviewed. The chart was reviewed.   She is without complaints today.  She uses Radioplex gel.  Physical Examination:  Filed Vitals:   10/08/14 1327  BP: 160/91  Pulse: 143  Resp: 16  .  Weight: 262 lb 3.2 oz (118.933 kg).  No significant skin changes along the right breast.  Impression: Tolerating radiation therapy well.  Plan: Continue radiation therapy as planned.

## 2014-10-09 ENCOUNTER — Ambulatory Visit
Admission: RE | Admit: 2014-10-09 | Discharge: 2014-10-09 | Disposition: A | Discharge status: Home / Self Care | Payer: Medicare Other | Source: Ambulatory Visit | Attending: Radiation Oncology | Admitting: Radiation Oncology

## 2014-10-09 ENCOUNTER — Ambulatory Visit: Payer: Medicare Other

## 2014-10-09 DIAGNOSIS — Z171 Estrogen receptor negative status [ER-]: Secondary | ICD-10-CM | POA: Diagnosis not present

## 2014-10-09 DIAGNOSIS — C50511 Malignant neoplasm of lower-outer quadrant of right female breast: Secondary | ICD-10-CM | POA: Diagnosis not present

## 2014-10-09 DIAGNOSIS — Z79899 Other long term (current) drug therapy: Secondary | ICD-10-CM | POA: Diagnosis not present

## 2014-10-09 DIAGNOSIS — Z510 Encounter for antineoplastic radiation therapy: Secondary | ICD-10-CM | POA: Diagnosis not present

## 2014-10-10 ENCOUNTER — Ambulatory Visit
Admission: RE | Admit: 2014-10-10 | Discharge: 2014-10-10 | Disposition: A | Discharge status: Home / Self Care | Payer: Medicare Other | Source: Ambulatory Visit | Attending: Radiation Oncology | Admitting: Radiation Oncology

## 2014-10-10 ENCOUNTER — Ambulatory Visit: Payer: Medicare Other

## 2014-10-10 DIAGNOSIS — Z79899 Other long term (current) drug therapy: Secondary | ICD-10-CM | POA: Diagnosis not present

## 2014-10-10 DIAGNOSIS — C50511 Malignant neoplasm of lower-outer quadrant of right female breast: Secondary | ICD-10-CM | POA: Diagnosis not present

## 2014-10-10 DIAGNOSIS — Z171 Estrogen receptor negative status [ER-]: Secondary | ICD-10-CM | POA: Diagnosis not present

## 2014-10-10 DIAGNOSIS — Z510 Encounter for antineoplastic radiation therapy: Secondary | ICD-10-CM | POA: Diagnosis not present

## 2014-10-11 ENCOUNTER — Ambulatory Visit: Payer: Medicare Other

## 2014-10-11 ENCOUNTER — Ambulatory Visit
Admission: RE | Admit: 2014-10-11 | Discharge: 2014-10-11 | Disposition: A | Discharge status: Home / Self Care | Payer: Medicare Other | Source: Ambulatory Visit | Attending: Radiation Oncology | Admitting: Radiation Oncology

## 2014-10-11 DIAGNOSIS — C50511 Malignant neoplasm of lower-outer quadrant of right female breast: Secondary | ICD-10-CM | POA: Diagnosis not present

## 2014-10-11 DIAGNOSIS — I10 Essential (primary) hypertension: Secondary | ICD-10-CM | POA: Diagnosis not present

## 2014-10-11 DIAGNOSIS — Z171 Estrogen receptor negative status [ER-]: Secondary | ICD-10-CM | POA: Diagnosis not present

## 2014-10-11 DIAGNOSIS — I493 Ventricular premature depolarization: Secondary | ICD-10-CM | POA: Diagnosis not present

## 2014-10-11 DIAGNOSIS — Z79899 Other long term (current) drug therapy: Secondary | ICD-10-CM | POA: Diagnosis not present

## 2014-10-11 DIAGNOSIS — Z510 Encounter for antineoplastic radiation therapy: Secondary | ICD-10-CM | POA: Diagnosis not present

## 2014-10-12 HISTORY — PX: PYLOROPLASTY: SHX418

## 2014-10-15 ENCOUNTER — Encounter: Payer: Self-pay | Admitting: Genetic Counselor

## 2014-10-15 ENCOUNTER — Ambulatory Visit: Payer: Medicare Other

## 2014-10-15 ENCOUNTER — Ambulatory Visit
Admission: RE | Admit: 2014-10-15 | Discharge: 2014-10-15 | Disposition: A | Discharge status: Home / Self Care | Payer: Medicare Other | Source: Ambulatory Visit | Attending: Radiation Oncology | Admitting: Radiation Oncology

## 2014-10-15 ENCOUNTER — Telehealth: Payer: Self-pay | Admitting: Genetic Counselor

## 2014-10-15 ENCOUNTER — Encounter: Payer: Self-pay | Admitting: Radiation Oncology

## 2014-10-15 VITALS — BP 152/63 | HR 68 | Temp 97.9°F | Resp 20 | Wt 261.5 lb

## 2014-10-15 DIAGNOSIS — Z79899 Other long term (current) drug therapy: Secondary | ICD-10-CM | POA: Diagnosis not present

## 2014-10-15 DIAGNOSIS — Z1379 Encounter for other screening for genetic and chromosomal anomalies: Secondary | ICD-10-CM | POA: Insufficient documentation

## 2014-10-15 DIAGNOSIS — Z510 Encounter for antineoplastic radiation therapy: Secondary | ICD-10-CM | POA: Diagnosis not present

## 2014-10-15 DIAGNOSIS — Z171 Estrogen receptor negative status [ER-]: Secondary | ICD-10-CM | POA: Diagnosis not present

## 2014-10-15 DIAGNOSIS — C50511 Malignant neoplasm of lower-outer quadrant of right female breast: Secondary | ICD-10-CM | POA: Diagnosis not present

## 2014-10-15 NOTE — Progress Notes (Signed)
Patient denies pain, fatigue, loss of appetite. She is applying Radiaplex to right breast; she states her skin "is pink right after treatment but then it fades".

## 2014-10-15 NOTE — Progress Notes (Signed)
HPI: Ms. Runquist was previously seen in the [facility] clinic due to a personal and family history of cancer and concerns regarding a hereditary predisposition to cancer. Please refer to our prior cancer genetics clinic note for more information regarding Ms. Keisling's medical, social and family histories, and our assessment and recommendations, at the time. Ms. Rauber recent genetic test results were disclosed to her, as were recommendations warranted by these results. These results and recommendations are discussed in more detail below.  GENETIC TEST RESULTS: At the time of Ms. Leino's visit, we recommended she pursue genetic testing of the OvaNext gene panel. This test, which included sequencing and deletion/duplication analysis of the following genes:  ATM, BARD1, BRCA1, BRCA2, BRIP1, CDH1, CHEK2, EPCAM, MLH1, MRE11A, MSH2, MSH6, MUTYH, NBN, NF1, PALB2, PMS2, PTEN, RAD50, RAD51C, RAD51D, SMARCA4, STK11, and TP53.  The report date is 10/15/2014.  Testing was performed at [facility]. Genetic testing found a BRCA2 VUS called [id number], but was otherwise normal, and did not reveal a deleterious mutation in these genes. The test report has been scanned into EPIC and is located under the Media tab. Ms. Bromell will receive a Secure email with the test results.  We discussed with Ms. Gilles that since the current genetic testing is not perfect, it is possible there may be a gene mutation in one of these genes that current testing cannot detect, but that chance is small. We also discussed, that it is possible that another gene that has not yet been discovered, or that we have not yet tested, is responsible for the cancer diagnoses in the family, and it is, therefore, important to remain in touch with cancer genetics in the future so that we can continue to offer Ms. Lembo the most up to date genetic testing.   CANCER SCREENING RECOMMENDATIONS: This result is reassuring and  suggests that Ms. Bona's cancer was most likely not due to an inherited predisposition associated with one of these genes. Most cancers happen by chance and this negative test, along with details of her family history, suggests that her cancer falls into this category. We, therefore, recommended she continue to follow the cancer management and screening guidelines provided by her oncology and primary providers.   RECOMMENDATIONS FOR FAMILY MEMBERS: Women in this family might be at some increased risk of developing cancer, over the general population risk, simply due to the family history of cancer. We recommended women in this family have a yearly mammogram beginning at age 82, an an annual clinical breast exam, and perform monthly breast self-exams. Women in this family should also have a gynecological exam as recommended by their primary provider. All family members should have a colonoscopy by age 10.  FOLLOW-UP: Lastly, we discussed with Ms. Davlin that cancer genetics is a rapidly advancing field and it is possible that new genetic tests will be appropriate for her and/or her family members in the future. We encouraged her to remain in contact with cancer genetics on an annual basis so we can update her personal and family histories and let her know of advances in cancer genetics that may benefit this family.   Our contact number was provided. Ms.. Lean questions were answered to her satisfaction, and she knows she is welcome to call us at anytime with additional questions or concerns.   Roma Kayser, MS, [facility] Certified Genetic Counselor [email or web address]

## 2014-10-15 NOTE — Telephone Encounter (Signed)
BRCA2 VUS found on OvaNext panel testing.  Otherwise this was a negative test.

## 2014-10-15 NOTE — Progress Notes (Signed)
   Weekly Management Note:  Outpatient    ICD-9-CM ICD-10-CM   1. Breast cancer of lower-outer quadrant of right female breast 174.5 C50.511     Current Dose:  16.2 Gy  Projected Dose: 50.4 Gy initial   Narrative:  The patient presents for routine under treatment assessment.  CBCT/MVCT images/Port film x-rays were reviewed.  The chart was checked. No new complaints  Physical Findings:  weight is 261 lb 8 oz (118.616 kg). Her oral temperature is 97.9 F (36.6 C). Her blood pressure is 152/63 and her pulse is 68. Her respiration is 20.  mild erythema, right breast  Impression:  The patient is tolerating radiotherapy.  Plan:  Continue radiotherapy as planned.   ________________________________   Eppie Gibson, M.D.

## 2014-10-16 ENCOUNTER — Ambulatory Visit: Payer: Medicare Other

## 2014-10-16 ENCOUNTER — Ambulatory Visit
Admission: RE | Admit: 2014-10-16 | Discharge: 2014-10-16 | Disposition: A | Discharge status: Home / Self Care | Payer: Medicare Other | Source: Ambulatory Visit | Attending: Radiation Oncology | Admitting: Radiation Oncology

## 2014-10-16 DIAGNOSIS — Z510 Encounter for antineoplastic radiation therapy: Secondary | ICD-10-CM | POA: Diagnosis not present

## 2014-10-16 DIAGNOSIS — Z79899 Other long term (current) drug therapy: Secondary | ICD-10-CM | POA: Diagnosis not present

## 2014-10-16 DIAGNOSIS — C50511 Malignant neoplasm of lower-outer quadrant of right female breast: Secondary | ICD-10-CM | POA: Diagnosis not present

## 2014-10-16 DIAGNOSIS — Z171 Estrogen receptor negative status [ER-]: Secondary | ICD-10-CM | POA: Diagnosis not present

## 2014-10-17 ENCOUNTER — Ambulatory Visit: Payer: Medicare Other

## 2014-10-17 ENCOUNTER — Ambulatory Visit
Admission: RE | Admit: 2014-10-17 | Discharge: 2014-10-17 | Disposition: A | Discharge status: Home / Self Care | Payer: Medicare Other | Source: Ambulatory Visit | Attending: Radiation Oncology | Admitting: Radiation Oncology

## 2014-10-17 DIAGNOSIS — C50511 Malignant neoplasm of lower-outer quadrant of right female breast: Secondary | ICD-10-CM | POA: Diagnosis not present

## 2014-10-17 DIAGNOSIS — Z510 Encounter for antineoplastic radiation therapy: Secondary | ICD-10-CM | POA: Diagnosis not present

## 2014-10-17 DIAGNOSIS — Z79899 Other long term (current) drug therapy: Secondary | ICD-10-CM | POA: Diagnosis not present

## 2014-10-17 DIAGNOSIS — Z171 Estrogen receptor negative status [ER-]: Secondary | ICD-10-CM | POA: Diagnosis not present

## 2014-10-18 ENCOUNTER — Ambulatory Visit: Payer: Medicare Other

## 2014-10-18 ENCOUNTER — Ambulatory Visit
Admission: RE | Admit: 2014-10-18 | Discharge: 2014-10-18 | Disposition: A | Discharge status: Home / Self Care | Payer: Medicare Other | Source: Ambulatory Visit | Attending: Radiation Oncology | Admitting: Radiation Oncology

## 2014-10-18 DIAGNOSIS — Z510 Encounter for antineoplastic radiation therapy: Secondary | ICD-10-CM | POA: Diagnosis not present

## 2014-10-18 DIAGNOSIS — C50511 Malignant neoplasm of lower-outer quadrant of right female breast: Secondary | ICD-10-CM | POA: Diagnosis not present

## 2014-10-18 DIAGNOSIS — Z171 Estrogen receptor negative status [ER-]: Secondary | ICD-10-CM | POA: Diagnosis not present

## 2014-10-18 DIAGNOSIS — Z79899 Other long term (current) drug therapy: Secondary | ICD-10-CM | POA: Diagnosis not present

## 2014-10-19 ENCOUNTER — Ambulatory Visit: Payer: Medicare Other

## 2014-10-19 ENCOUNTER — Ambulatory Visit
Admission: RE | Admit: 2014-10-19 | Discharge: 2014-10-19 | Disposition: A | Discharge status: Home / Self Care | Payer: Medicare Other | Source: Ambulatory Visit | Attending: Radiation Oncology | Admitting: Radiation Oncology

## 2014-10-19 DIAGNOSIS — Z79899 Other long term (current) drug therapy: Secondary | ICD-10-CM | POA: Diagnosis not present

## 2014-10-19 DIAGNOSIS — Z171 Estrogen receptor negative status [ER-]: Secondary | ICD-10-CM | POA: Diagnosis not present

## 2014-10-19 DIAGNOSIS — Z510 Encounter for antineoplastic radiation therapy: Secondary | ICD-10-CM | POA: Diagnosis not present

## 2014-10-19 DIAGNOSIS — C50511 Malignant neoplasm of lower-outer quadrant of right female breast: Secondary | ICD-10-CM | POA: Diagnosis not present

## 2014-10-22 ENCOUNTER — Encounter: Payer: Self-pay | Admitting: Radiation Oncology

## 2014-10-22 ENCOUNTER — Ambulatory Visit
Admission: RE | Admit: 2014-10-22 | Discharge: 2014-10-22 | Disposition: A | Discharge status: Home / Self Care | Payer: Medicare Other | Source: Ambulatory Visit | Attending: Radiation Oncology | Admitting: Radiation Oncology

## 2014-10-22 ENCOUNTER — Ambulatory Visit: Payer: Medicare Other

## 2014-10-22 VITALS — BP 150/71 | HR 75 | Temp 97.5°F | Resp 20 | Wt 259.3 lb

## 2014-10-22 DIAGNOSIS — E780 Pure hypercholesterolemia: Secondary | ICD-10-CM | POA: Diagnosis not present

## 2014-10-22 DIAGNOSIS — C50511 Malignant neoplasm of lower-outer quadrant of right female breast: Secondary | ICD-10-CM | POA: Diagnosis not present

## 2014-10-22 DIAGNOSIS — Z171 Estrogen receptor negative status [ER-]: Secondary | ICD-10-CM | POA: Diagnosis not present

## 2014-10-22 DIAGNOSIS — Z79899 Other long term (current) drug therapy: Secondary | ICD-10-CM | POA: Diagnosis not present

## 2014-10-22 DIAGNOSIS — I10 Essential (primary) hypertension: Secondary | ICD-10-CM | POA: Diagnosis not present

## 2014-10-22 DIAGNOSIS — Z510 Encounter for antineoplastic radiation therapy: Secondary | ICD-10-CM | POA: Diagnosis not present

## 2014-10-22 DIAGNOSIS — I493 Ventricular premature depolarization: Secondary | ICD-10-CM | POA: Diagnosis not present

## 2014-10-22 NOTE — Progress Notes (Signed)
   Weekly Management Note:  Outpatient    ICD-9-CM ICD-10-CM   1. Breast cancer of lower-outer quadrant of right female breast 174.5 C50.511     Current Dose:  25.2 Gy  Projected Dose: 50.4 Gy initial   Narrative:  The patient presents for routine under treatment assessment.  CBCT/MVCT images/Port film x-rays were reviewed.  The chart was checked. No new complaints except nipple is tender  Physical Findings:  weight is 259 lb 4.8 oz (117.618 kg). Her oral temperature is 97.5 F (36.4 C). Her blood pressure is 150/71 and her pulse is 75. Her respiration is 20.  mild erythema, right breast  Impression:  The patient is tolerating radiotherapy.  Plan:  Continue radiotherapy as planned.   ________________________________   Eppie Gibson, M.D.

## 2014-10-22 NOTE — Progress Notes (Signed)
Patient denies pain, fatigue, loss of appetite, skin irritation. She is applying Radiaplex twice daily, has slight pinkness of skin of right breast treatment area. She states her right nipple is "tender".

## 2014-10-23 ENCOUNTER — Ambulatory Visit
Admission: RE | Admit: 2014-10-23 | Discharge: 2014-10-23 | Disposition: A | Discharge status: Home / Self Care | Payer: Medicare Other | Source: Ambulatory Visit | Attending: Radiation Oncology | Admitting: Radiation Oncology

## 2014-10-23 ENCOUNTER — Ambulatory Visit: Payer: Medicare Other

## 2014-10-23 DIAGNOSIS — Z79899 Other long term (current) drug therapy: Secondary | ICD-10-CM | POA: Diagnosis not present

## 2014-10-23 DIAGNOSIS — Z510 Encounter for antineoplastic radiation therapy: Secondary | ICD-10-CM | POA: Diagnosis not present

## 2014-10-23 DIAGNOSIS — Z171 Estrogen receptor negative status [ER-]: Secondary | ICD-10-CM | POA: Diagnosis not present

## 2014-10-23 DIAGNOSIS — C50511 Malignant neoplasm of lower-outer quadrant of right female breast: Secondary | ICD-10-CM | POA: Diagnosis not present

## 2014-10-24 ENCOUNTER — Ambulatory Visit
Admission: RE | Admit: 2014-10-24 | Discharge: 2014-10-24 | Disposition: A | Discharge status: Home / Self Care | Payer: Medicare Other | Source: Ambulatory Visit | Attending: Radiation Oncology | Admitting: Radiation Oncology

## 2014-10-24 ENCOUNTER — Ambulatory Visit: Payer: Medicare Other

## 2014-10-24 DIAGNOSIS — C50511 Malignant neoplasm of lower-outer quadrant of right female breast: Secondary | ICD-10-CM | POA: Diagnosis not present

## 2014-10-24 DIAGNOSIS — Z510 Encounter for antineoplastic radiation therapy: Secondary | ICD-10-CM | POA: Diagnosis not present

## 2014-10-24 DIAGNOSIS — Z171 Estrogen receptor negative status [ER-]: Secondary | ICD-10-CM | POA: Diagnosis not present

## 2014-10-24 DIAGNOSIS — Z79899 Other long term (current) drug therapy: Secondary | ICD-10-CM | POA: Diagnosis not present

## 2014-10-25 ENCOUNTER — Ambulatory Visit
Admission: RE | Admit: 2014-10-25 | Discharge: 2014-10-25 | Disposition: A | Discharge status: Home / Self Care | Payer: Medicare Other | Source: Ambulatory Visit | Attending: Radiation Oncology | Admitting: Radiation Oncology

## 2014-10-25 ENCOUNTER — Ambulatory Visit: Payer: Medicare Other

## 2014-10-25 DIAGNOSIS — Z510 Encounter for antineoplastic radiation therapy: Secondary | ICD-10-CM | POA: Diagnosis not present

## 2014-10-25 DIAGNOSIS — C50511 Malignant neoplasm of lower-outer quadrant of right female breast: Secondary | ICD-10-CM | POA: Diagnosis not present

## 2014-10-25 DIAGNOSIS — Z79899 Other long term (current) drug therapy: Secondary | ICD-10-CM | POA: Diagnosis not present

## 2014-10-25 DIAGNOSIS — Z171 Estrogen receptor negative status [ER-]: Secondary | ICD-10-CM | POA: Diagnosis not present

## 2014-10-26 ENCOUNTER — Encounter: Payer: Self-pay | Admitting: Radiation Oncology

## 2014-10-26 ENCOUNTER — Ambulatory Visit: Payer: Medicare Other

## 2014-10-26 ENCOUNTER — Ambulatory Visit
Admission: RE | Admit: 2014-10-26 | Discharge: 2014-10-26 | Disposition: A | Discharge status: Home / Self Care | Payer: Medicare Other | Source: Ambulatory Visit | Attending: Radiation Oncology | Admitting: Radiation Oncology

## 2014-10-26 DIAGNOSIS — Z79899 Other long term (current) drug therapy: Secondary | ICD-10-CM | POA: Diagnosis not present

## 2014-10-26 DIAGNOSIS — R0902 Hypoxemia: Secondary | ICD-10-CM | POA: Diagnosis not present

## 2014-10-26 DIAGNOSIS — C50511 Malignant neoplasm of lower-outer quadrant of right female breast: Secondary | ICD-10-CM | POA: Diagnosis not present

## 2014-10-26 DIAGNOSIS — Z510 Encounter for antineoplastic radiation therapy: Secondary | ICD-10-CM | POA: Diagnosis not present

## 2014-10-26 DIAGNOSIS — Z171 Estrogen receptor negative status [ER-]: Secondary | ICD-10-CM | POA: Diagnosis not present

## 2014-10-29 ENCOUNTER — Ambulatory Visit
Admission: RE | Admit: 2014-10-29 | Discharge: 2014-10-29 | Disposition: A | Discharge status: Home / Self Care | Payer: Medicare Other | Source: Ambulatory Visit | Attending: Radiation Oncology | Admitting: Radiation Oncology

## 2014-10-29 ENCOUNTER — Ambulatory Visit: Payer: Medicare Other

## 2014-10-29 ENCOUNTER — Encounter: Payer: Self-pay | Admitting: Radiation Oncology

## 2014-10-29 VITALS — BP 152/62 | HR 77 | Temp 98.4°F | Wt 259.2 lb

## 2014-10-29 DIAGNOSIS — Z171 Estrogen receptor negative status [ER-]: Secondary | ICD-10-CM | POA: Diagnosis not present

## 2014-10-29 DIAGNOSIS — Z510 Encounter for antineoplastic radiation therapy: Secondary | ICD-10-CM | POA: Diagnosis not present

## 2014-10-29 DIAGNOSIS — Z79899 Other long term (current) drug therapy: Secondary | ICD-10-CM | POA: Diagnosis not present

## 2014-10-29 DIAGNOSIS — C50511 Malignant neoplasm of lower-outer quadrant of right female breast: Secondary | ICD-10-CM

## 2014-10-29 NOTE — Progress Notes (Signed)
Patient reports itching of upper rightt breast where she has radiation dermatitis, advised she may apply Cortisone cream 1% which she states she has at home. She is applying Radiaplex to right breast for erythema, no desquamation.  She denies fatigue, loss of appetite.

## 2014-10-29 NOTE — Progress Notes (Signed)
   Weekly Management Note:  Outpatient    ICD-9-CM ICD-10-CM   1. Breast cancer of lower-outer quadrant of right female breast 174.5 C50.511     Current Dose: 34.2 Gy  Projected Dose: 50.4 Gy initial   Narrative:  The patient presents for routine under treatment assessment.  CBCT/MVCT images/Port film x-rays were reviewed.  The chart was checked. Patient reports itching of upper rightt breast where she has radiation dermatitis She is applying Radiaplex to right breast for erythema, no desquamation.  She denies fatigue, loss of appetite.  Physical Findings:  weight is 259 lb 3.2 oz (117.572 kg). Her oral temperature is 98.4 F (36.9 C). Her blood pressure is 152/62 and her pulse is 77.  mild erythema, right breast; radiation dermatitis in UIQ  Impression:  The patient is tolerating radiotherapy.  Plan:  Continue radiotherapy as planned. Advised she may apply Cortisone cream 1% to itchy areas; she states she has at home.  ________________________________   Eppie Gibson, M.D.

## 2014-10-30 ENCOUNTER — Ambulatory Visit: Payer: Medicare Other

## 2014-10-30 ENCOUNTER — Ambulatory Visit
Admission: RE | Admit: 2014-10-30 | Discharge: 2014-10-30 | Disposition: A | Discharge status: Home / Self Care | Payer: Medicare Other | Source: Ambulatory Visit | Attending: Radiation Oncology | Admitting: Radiation Oncology

## 2014-10-30 DIAGNOSIS — C50511 Malignant neoplasm of lower-outer quadrant of right female breast: Secondary | ICD-10-CM | POA: Diagnosis not present

## 2014-10-30 DIAGNOSIS — Z79899 Other long term (current) drug therapy: Secondary | ICD-10-CM | POA: Diagnosis not present

## 2014-10-30 DIAGNOSIS — Z171 Estrogen receptor negative status [ER-]: Secondary | ICD-10-CM | POA: Diagnosis not present

## 2014-10-30 DIAGNOSIS — Z510 Encounter for antineoplastic radiation therapy: Secondary | ICD-10-CM | POA: Diagnosis not present

## 2014-10-31 ENCOUNTER — Ambulatory Visit
Admission: RE | Admit: 2014-10-31 | Discharge: 2014-10-31 | Disposition: A | Discharge status: Home / Self Care | Payer: Medicare Other | Source: Ambulatory Visit | Attending: Radiation Oncology | Admitting: Radiation Oncology

## 2014-10-31 ENCOUNTER — Ambulatory Visit: Payer: Medicare Other

## 2014-10-31 DIAGNOSIS — Z510 Encounter for antineoplastic radiation therapy: Secondary | ICD-10-CM | POA: Diagnosis not present

## 2014-10-31 DIAGNOSIS — Z79899 Other long term (current) drug therapy: Secondary | ICD-10-CM | POA: Diagnosis not present

## 2014-10-31 DIAGNOSIS — Z171 Estrogen receptor negative status [ER-]: Secondary | ICD-10-CM | POA: Diagnosis not present

## 2014-10-31 DIAGNOSIS — C50511 Malignant neoplasm of lower-outer quadrant of right female breast: Secondary | ICD-10-CM | POA: Diagnosis not present

## 2014-11-01 ENCOUNTER — Ambulatory Visit: Payer: Medicare Other

## 2014-11-01 ENCOUNTER — Ambulatory Visit
Admission: RE | Admit: 2014-11-01 | Discharge: 2014-11-01 | Disposition: A | Discharge status: Home / Self Care | Payer: Medicare Other | Source: Ambulatory Visit | Attending: Radiation Oncology | Admitting: Radiation Oncology

## 2014-11-01 DIAGNOSIS — Z510 Encounter for antineoplastic radiation therapy: Secondary | ICD-10-CM | POA: Diagnosis not present

## 2014-11-01 DIAGNOSIS — Z171 Estrogen receptor negative status [ER-]: Secondary | ICD-10-CM | POA: Diagnosis not present

## 2014-11-01 DIAGNOSIS — C50511 Malignant neoplasm of lower-outer quadrant of right female breast: Secondary | ICD-10-CM | POA: Diagnosis not present

## 2014-11-01 DIAGNOSIS — Z79899 Other long term (current) drug therapy: Secondary | ICD-10-CM | POA: Diagnosis not present

## 2014-11-02 ENCOUNTER — Ambulatory Visit: Payer: Medicare Other

## 2014-11-05 ENCOUNTER — Ambulatory Visit: Payer: Medicare Other

## 2014-11-05 ENCOUNTER — Ambulatory Visit: Payer: Medicare Other | Admitting: Radiation Oncology

## 2014-11-06 ENCOUNTER — Ambulatory Visit: Payer: Medicare Other

## 2014-11-06 ENCOUNTER — Ambulatory Visit
Admission: RE | Admit: 2014-11-06 | Discharge: 2014-11-06 | Disposition: A | Discharge status: Home / Self Care | Payer: Medicare Other | Source: Ambulatory Visit | Attending: Radiation Oncology | Admitting: Radiation Oncology

## 2014-11-06 DIAGNOSIS — Z510 Encounter for antineoplastic radiation therapy: Secondary | ICD-10-CM | POA: Diagnosis not present

## 2014-11-06 DIAGNOSIS — Z79899 Other long term (current) drug therapy: Secondary | ICD-10-CM | POA: Diagnosis not present

## 2014-11-06 DIAGNOSIS — Z171 Estrogen receptor negative status [ER-]: Secondary | ICD-10-CM | POA: Diagnosis not present

## 2014-11-06 DIAGNOSIS — C50511 Malignant neoplasm of lower-outer quadrant of right female breast: Secondary | ICD-10-CM | POA: Diagnosis not present

## 2014-11-07 ENCOUNTER — Ambulatory Visit
Admission: RE | Admit: 2014-11-07 | Discharge: 2014-11-07 | Disposition: A | Discharge status: Home / Self Care | Payer: Medicare Other | Source: Ambulatory Visit | Attending: Radiation Oncology | Admitting: Radiation Oncology

## 2014-11-07 ENCOUNTER — Ambulatory Visit: Payer: Medicare Other

## 2014-11-07 ENCOUNTER — Encounter: Payer: Self-pay | Admitting: Radiation Oncology

## 2014-11-07 VITALS — BP 117/89 | HR 96 | Temp 97.6°F | Resp 20 | Wt 257.1 lb

## 2014-11-07 DIAGNOSIS — Z171 Estrogen receptor negative status [ER-]: Secondary | ICD-10-CM | POA: Diagnosis not present

## 2014-11-07 DIAGNOSIS — C50511 Malignant neoplasm of lower-outer quadrant of right female breast: Secondary | ICD-10-CM

## 2014-11-07 DIAGNOSIS — Z510 Encounter for antineoplastic radiation therapy: Secondary | ICD-10-CM | POA: Diagnosis not present

## 2014-11-07 DIAGNOSIS — Z79899 Other long term (current) drug therapy: Secondary | ICD-10-CM | POA: Diagnosis not present

## 2014-11-07 NOTE — Progress Notes (Signed)
Patient denies pain, fatigue, loss of appetite.  She has impending moist desquamation under right breast; advised Dr Isidore Moos may instruct her to apply antibiotic ointment in this area. She has used Cortisone cream for dermatitis of upper right breast with relief of itching. She is applying Radiaplex to remainder of right breast treatment area for hyperpigmentation/erythema.

## 2014-11-07 NOTE — Progress Notes (Signed)
   Weekly Management Note:  Outpatient    ICD-9-CM ICD-10-CM   1. Breast cancer of lower-outer quadrant of right female breast 174.5 C50.511     Current Dose: 43.2 Gy  Projected Dose: 50.4 Gy initial   Narrative:  The patient presents for routine under treatment assessment.  CBCT/MVCT images/Port film x-rays were reviewed.  The chart was checked. Patient denies pain, fatigue, loss of appetite.  She has used Cortisone cream for dermatitis of upper right breast with relief of itching. She is applying Radiaplex to remainder of right breast treatment area for hyperpigmentation/erythema.    Physical Findings:  weight is 257 lb 1.6 oz (116.62 kg). Her oral temperature is 97.6 F (36.4 C). Her blood pressure is 117/89 and her pulse is 96. Her respiration is 20.  moderate erythema, right breast; radiation dermatitis in UIQ; She has impending moist desquamation under right breast   Impression:  The patient is tolerating radiotherapy.  Plan:  Continue radiotherapy as planned. Start neosporin at inframammary fold   ________________________________   Eppie Gibson, M.D.

## 2014-11-08 ENCOUNTER — Ambulatory Visit
Admission: RE | Admit: 2014-11-08 | Discharge: 2014-11-08 | Disposition: A | Discharge status: Home / Self Care | Payer: Medicare Other | Source: Ambulatory Visit | Attending: Radiation Oncology | Admitting: Radiation Oncology

## 2014-11-08 ENCOUNTER — Ambulatory Visit: Payer: Medicare Other

## 2014-11-08 DIAGNOSIS — C50511 Malignant neoplasm of lower-outer quadrant of right female breast: Secondary | ICD-10-CM | POA: Diagnosis not present

## 2014-11-08 DIAGNOSIS — Z79899 Other long term (current) drug therapy: Secondary | ICD-10-CM | POA: Diagnosis not present

## 2014-11-08 DIAGNOSIS — Z510 Encounter for antineoplastic radiation therapy: Secondary | ICD-10-CM | POA: Diagnosis not present

## 2014-11-08 DIAGNOSIS — Z171 Estrogen receptor negative status [ER-]: Secondary | ICD-10-CM | POA: Diagnosis not present

## 2014-11-09 ENCOUNTER — Ambulatory Visit: Payer: Medicare Other

## 2014-11-09 ENCOUNTER — Ambulatory Visit
Admission: RE | Admit: 2014-11-09 | Discharge: 2014-11-09 | Disposition: A | Discharge status: Home / Self Care | Payer: Medicare Other | Source: Ambulatory Visit | Attending: Radiation Oncology | Admitting: Radiation Oncology

## 2014-11-09 DIAGNOSIS — C50511 Malignant neoplasm of lower-outer quadrant of right female breast: Secondary | ICD-10-CM | POA: Diagnosis not present

## 2014-11-09 DIAGNOSIS — Z510 Encounter for antineoplastic radiation therapy: Secondary | ICD-10-CM | POA: Diagnosis not present

## 2014-11-09 DIAGNOSIS — Z79899 Other long term (current) drug therapy: Secondary | ICD-10-CM | POA: Diagnosis not present

## 2014-11-09 DIAGNOSIS — Z171 Estrogen receptor negative status [ER-]: Secondary | ICD-10-CM | POA: Diagnosis not present

## 2014-11-12 ENCOUNTER — Ambulatory Visit: Payer: Medicare Other

## 2014-11-12 ENCOUNTER — Ambulatory Visit
Admission: RE | Admit: 2014-11-12 | Discharge: 2014-11-12 | Disposition: A | Discharge status: Home / Self Care | Payer: Medicare Other | Source: Ambulatory Visit | Attending: Radiation Oncology | Admitting: Radiation Oncology

## 2014-11-12 DIAGNOSIS — Z171 Estrogen receptor negative status [ER-]: Secondary | ICD-10-CM | POA: Diagnosis not present

## 2014-11-12 DIAGNOSIS — C50511 Malignant neoplasm of lower-outer quadrant of right female breast: Secondary | ICD-10-CM | POA: Diagnosis not present

## 2014-11-12 DIAGNOSIS — Z79899 Other long term (current) drug therapy: Secondary | ICD-10-CM | POA: Diagnosis not present

## 2014-11-12 DIAGNOSIS — Z510 Encounter for antineoplastic radiation therapy: Secondary | ICD-10-CM | POA: Diagnosis not present

## 2014-11-13 ENCOUNTER — Ambulatory Visit: Payer: Medicare Other

## 2014-11-13 ENCOUNTER — Ambulatory Visit
Admission: RE | Admit: 2014-11-13 | Discharge: 2014-11-13 | Disposition: A | Discharge status: Home / Self Care | Payer: Medicare Other | Source: Ambulatory Visit | Attending: Radiation Oncology | Admitting: Radiation Oncology

## 2014-11-13 DIAGNOSIS — Z510 Encounter for antineoplastic radiation therapy: Secondary | ICD-10-CM | POA: Diagnosis not present

## 2014-11-13 DIAGNOSIS — Z171 Estrogen receptor negative status [ER-]: Secondary | ICD-10-CM | POA: Diagnosis not present

## 2014-11-13 DIAGNOSIS — C50511 Malignant neoplasm of lower-outer quadrant of right female breast: Secondary | ICD-10-CM | POA: Diagnosis not present

## 2014-11-13 DIAGNOSIS — Z79899 Other long term (current) drug therapy: Secondary | ICD-10-CM | POA: Diagnosis not present

## 2014-11-14 ENCOUNTER — Ambulatory Visit
Admission: RE | Admit: 2014-11-14 | Discharge: 2014-11-14 | Disposition: A | Discharge status: Home / Self Care | Payer: Medicare Other | Source: Ambulatory Visit | Attending: Radiation Oncology | Admitting: Radiation Oncology

## 2014-11-14 ENCOUNTER — Encounter: Payer: Self-pay | Admitting: Radiation Oncology

## 2014-11-14 ENCOUNTER — Ambulatory Visit: Payer: Medicare Other

## 2014-11-14 VITALS — BP 153/83 | HR 74 | Temp 98.7°F | Resp 16 | Wt 257.6 lb

## 2014-11-14 DIAGNOSIS — Z79899 Other long term (current) drug therapy: Secondary | ICD-10-CM | POA: Diagnosis not present

## 2014-11-14 DIAGNOSIS — C50511 Malignant neoplasm of lower-outer quadrant of right female breast: Secondary | ICD-10-CM | POA: Diagnosis not present

## 2014-11-14 DIAGNOSIS — Z510 Encounter for antineoplastic radiation therapy: Secondary | ICD-10-CM | POA: Diagnosis not present

## 2014-11-14 DIAGNOSIS — Z171 Estrogen receptor negative status [ER-]: Secondary | ICD-10-CM | POA: Diagnosis not present

## 2014-11-14 MED ORDER — RADIAPLEXRX EX GEL
Freq: Once | CUTANEOUS | Status: AC
  Administered 2014-11-14: 14:00:00 via TOPICAL

## 2014-11-14 NOTE — Progress Notes (Signed)
Weekly rad xts 29/33 right breast compl;eted, erythema, and some dermititrs on breast, under inframmary fold, abrasiona /peeling, moist desquamtion, uses neosporin pain relief there and radiaplex elsewhere on breast , gave another tube radiaplex, c/o burning under inframmary fold,slight itching as well, discussed  [facility] support group starting Feb 8,2016, patient defers at this time, appetite good, slight fatigue started 1st boost tx today  1:33 PM

## 2014-11-14 NOTE — Progress Notes (Signed)
   Weekly Management Note:  Outpatient    ICD-9-CM ICD-10-CM   1. Breast cancer of lower-outer quadrant of right female breast 174.5 C50.511 hyaluronate sodium (RADIAPLEXRX) gel    Current Dose: 52.4 Gy  Projected Dose: 60.4 Gy    Narrative:  The patient presents for routine under treatment assessment.  CBCT/MVCT images/Port film x-rays were reviewed.  The chart was checked.  Feels well. Using neosporin at inframammary fold   Physical Findings:  weight is 257 lb 9.6 oz (116.847 kg). Her oral temperature is 98.7 F (37.1 C). Her blood pressure is 153/83 and her pulse is 74. Her respiration is 16.  moderate erythema, right breast; radiation dermatitis in UIQ; She has early moist desquamation under right breast   Impression:  The patient is tolerating radiotherapy.  Plan:  Continue radiotherapy as planned. Continue neosporin at inframammary fold   ________________________________   Eppie Gibson, M.D.

## 2014-11-15 ENCOUNTER — Ambulatory Visit
Admission: RE | Admit: 2014-11-15 | Discharge: 2014-11-15 | Disposition: A | Discharge status: Home / Self Care | Payer: Medicare Other | Source: Ambulatory Visit | Attending: Radiation Oncology | Admitting: Radiation Oncology

## 2014-11-15 ENCOUNTER — Other Ambulatory Visit: Payer: Self-pay | Admitting: Family Medicine

## 2014-11-15 ENCOUNTER — Ambulatory Visit: Payer: Medicare Other

## 2014-11-15 DIAGNOSIS — C50511 Malignant neoplasm of lower-outer quadrant of right female breast: Secondary | ICD-10-CM | POA: Diagnosis not present

## 2014-11-15 DIAGNOSIS — Z79899 Other long term (current) drug therapy: Secondary | ICD-10-CM | POA: Diagnosis not present

## 2014-11-15 DIAGNOSIS — Z171 Estrogen receptor negative status [ER-]: Secondary | ICD-10-CM | POA: Diagnosis not present

## 2014-11-15 DIAGNOSIS — Z510 Encounter for antineoplastic radiation therapy: Secondary | ICD-10-CM | POA: Diagnosis not present

## 2014-11-16 ENCOUNTER — Ambulatory Visit: Payer: Medicare Other

## 2014-11-16 ENCOUNTER — Ambulatory Visit
Admission: RE | Admit: 2014-11-16 | Discharge: 2014-11-16 | Disposition: A | Discharge status: Home / Self Care | Payer: Medicare Other | Source: Ambulatory Visit | Attending: Radiation Oncology | Admitting: Radiation Oncology

## 2014-11-16 DIAGNOSIS — C50511 Malignant neoplasm of lower-outer quadrant of right female breast: Secondary | ICD-10-CM | POA: Diagnosis not present

## 2014-11-16 DIAGNOSIS — Z510 Encounter for antineoplastic radiation therapy: Secondary | ICD-10-CM | POA: Diagnosis not present

## 2014-11-16 DIAGNOSIS — Z171 Estrogen receptor negative status [ER-]: Secondary | ICD-10-CM | POA: Diagnosis not present

## 2014-11-16 DIAGNOSIS — Z79899 Other long term (current) drug therapy: Secondary | ICD-10-CM | POA: Diagnosis not present

## 2014-11-19 ENCOUNTER — Ambulatory Visit
Admission: RE | Admit: 2014-11-19 | Discharge: 2014-11-19 | Disposition: A | Discharge status: Home / Self Care | Payer: Medicare Other | Source: Ambulatory Visit | Attending: Radiation Oncology | Admitting: Radiation Oncology

## 2014-11-19 ENCOUNTER — Encounter: Payer: Self-pay | Admitting: Radiation Oncology

## 2014-11-19 VITALS — BP 144/76 | HR 68 | Temp 98.0°F | Resp 20 | Wt 257.8 lb

## 2014-11-19 DIAGNOSIS — Z79899 Other long term (current) drug therapy: Secondary | ICD-10-CM | POA: Diagnosis not present

## 2014-11-19 DIAGNOSIS — C50511 Malignant neoplasm of lower-outer quadrant of right female breast: Secondary | ICD-10-CM | POA: Diagnosis not present

## 2014-11-19 DIAGNOSIS — Z171 Estrogen receptor negative status [ER-]: Secondary | ICD-10-CM | POA: Diagnosis not present

## 2014-11-19 DIAGNOSIS — Z510 Encounter for antineoplastic radiation therapy: Secondary | ICD-10-CM | POA: Diagnosis not present

## 2014-11-19 NOTE — Progress Notes (Signed)
Patient denies pain except for "occasional twinges of pain " in her right breast. She is applying Radiaplex to right breast for hyperpigmentation, applying Neosporin under right breast for moist desquamation. She completes tomorrow and has 1 month FU appointment. She denies fatigue, loss of appetite.

## 2014-11-19 NOTE — Progress Notes (Signed)
   Weekly Management Note:  Outpatient    ICD-9-CM ICD-10-CM   1. Breast cancer of lower-outer quadrant of right female breast 174.5 C50.511     Current Dose:  58.4 Gy  Projected Dose: 60.4 Gy   Narrative:  The patient presents for routine under treatment assessment.  CBCT/MVCT images/Port film x-rays were reviewed.  The chart was checked. She is doing well.  Patient denies pain except for "occasional twinges of pain " in her right breast. She is applying Radiaplex to right breast for hyperpigmentation, applying Neosporin under right breast for moist desquamation. She completes tomorrow and has 1 month FU appointment. She denies fatigue, loss of appetite.   Physical Findings:  weight is 257 lb 12.8 oz (116.937 kg). Her oral temperature is 98 F (36.7 C). Her blood pressure is 144/76 and her pulse is 68. Her respiration is 20.   Wt Readings from Last 3 Encounters:  10/01/14 265 lb 1.6 oz (120.249 kg)  10/01/14 264 lb (119.75 kg)  09/11/14 264 lb (119.75 kg)   Area of prior moist desquamation in inframammary fold is now dry. Erythema of right breast is diffuse.  Impression:  The patient is tolerating radiotherapy.  Plan:  Continue radiotherapy as planned. F/u in 28mo. In next few weeks may start applying Vit E lotion or oil to breast.  ________________________________   Eppie Gibson, M.D.

## 2014-11-20 ENCOUNTER — Ambulatory Visit
Admission: RE | Admit: 2014-11-20 | Discharge: 2014-11-20 | Disposition: A | Discharge status: Home / Self Care | Payer: Medicare Other | Source: Ambulatory Visit | Attending: Radiation Oncology | Admitting: Radiation Oncology

## 2014-11-20 ENCOUNTER — Encounter: Payer: Self-pay | Admitting: Radiation Oncology

## 2014-11-20 DIAGNOSIS — Z510 Encounter for antineoplastic radiation therapy: Secondary | ICD-10-CM | POA: Diagnosis not present

## 2014-11-20 DIAGNOSIS — Z79899 Other long term (current) drug therapy: Secondary | ICD-10-CM | POA: Diagnosis not present

## 2014-11-20 DIAGNOSIS — C50511 Malignant neoplasm of lower-outer quadrant of right female breast: Secondary | ICD-10-CM | POA: Diagnosis not present

## 2014-11-20 DIAGNOSIS — Z171 Estrogen receptor negative status [ER-]: Secondary | ICD-10-CM | POA: Diagnosis not present

## 2014-11-20 NOTE — Progress Notes (Signed)
  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      Plan: The patient has completed radiation treatment. The patient will return to radiation oncology clinic for routine followup in one month. I advised them to call or return sooner if they have any questions or concerns related to their recovery or treatment.  -----------------------------------  Eppie Gibson, MD

## 2014-12-19 NOTE — Progress Notes (Signed)
Photon Boost Complex Emergency planning/management officer Note Outpatient  Diagnosis: Breast Cancer  The patient's CT images from her free-breathing simulation were reviewed to plan her boost treatment to her right breast  lumpectomy cavity.  The boost to the lumpectomy cavity will be delivered with 3 photon fields using MLCs for custom blocks again heart and lungs, with 6 and 10 MV photon energy.  This constitutes 3 complex treatment devices. Isodose plan was reviewed and approved. 10 Gy in 5 fractions prescribed.  -----------------------------------  Eppie Gibson, MD

## 2014-12-21 ENCOUNTER — Encounter: Payer: Self-pay | Admitting: *Deleted

## 2014-12-28 ENCOUNTER — Ambulatory Visit
Admission: RE | Admit: 2014-12-28 | Discharge: 2014-12-28 | Disposition: A | Discharge status: Home / Self Care | Payer: Medicare Other | Source: Ambulatory Visit | Attending: Radiation Oncology | Admitting: Radiation Oncology

## 2014-12-28 ENCOUNTER — Encounter: Payer: Self-pay | Admitting: Adult Health

## 2014-12-28 ENCOUNTER — Ambulatory Visit (HOSPITAL_BASED_OUTPATIENT_CLINIC_OR_DEPARTMENT_OTHER): Payer: Medicare Other | Admitting: Adult Health

## 2014-12-28 ENCOUNTER — Telehealth: Payer: Self-pay | Admitting: *Deleted

## 2014-12-28 VITALS — BP 152/89 | HR 79 | Temp 98.6°F | Resp 20 | Wt 257.0 lb

## 2014-12-28 VITALS — BP 152/89 | HR 70 | Temp 98.6°F | Resp 20

## 2014-12-28 DIAGNOSIS — C50511 Malignant neoplasm of lower-outer quadrant of right female breast: Secondary | ICD-10-CM

## 2014-12-28 DIAGNOSIS — Z853 Personal history of malignant neoplasm of breast: Secondary | ICD-10-CM

## 2014-12-28 DIAGNOSIS — R530 Neoplastic (malignant) related fatigue: Secondary | ICD-10-CM | POA: Diagnosis not present

## 2014-12-28 NOTE — Progress Notes (Addendum)
Patient denies pain but does state she has a small area on her breastbone which is "sore when she presses on it". She continues to apply lotion and vitamin E oil to right breast. She states the slight fatigue she developed at the end of radiation treatments has resolved, no loss of appetite.  She will see Elzie Rings, NP following her appointment with Dr Isidore Moos today.

## 2014-12-28 NOTE — Telephone Encounter (Signed)
Called patient to remind of fu for 12-2015, lvm for a return call

## 2014-12-28 NOTE — Progress Notes (Signed)
  Radiation Oncology         [phone] ________________________________  Name: Miranda Mueller MRN: [id number]  Date: 12/28/2014  DOB: 10-14-1940  Follow-Up Visit Note  Outpatient  CC: Delia Chimes, NP  Excell Seltzer, MD  Diagnosis and Prior Radiotherapy: No diagnosis found.   Right Breast LOQ T1cN0M0 Stage I Grade III INVASIVE MAMMARY CARCINOMA WITH SQUAMOUS DIFFERENTIATION (METAPLASTIC CARCINOMA), Triple negative  Indication for treatment:  curative     Radiation treatment dates:   10/01/2014-11/20/2014  Site/dose:   1) Right Breast / 50.4 Gy in 28 fractions 2) Right Breast Boost / 10 Gy in 5 fractions   Narrative:  The patient returns today for routine follow-up.  She has a small area on her right medial rib which is "sore when she presses on it".  Not sore at rest. She continues to apply lotion and vitamin E oil to right breast. She states the slight fatigue she developed at the end of radiation treatments has resolved, no loss of appetite.  She will see Elzie Rings, NP following her appointment with me.   ALLERGIES:  has No Known Allergies.  Meds: Current Outpatient Prescriptions  Medication Sig Dispense Refill  . atorvastatin (LIPITOR) 10 MG tablet     . BENICAR 20 MG tablet Take 1 tablet by mouth  daily 90 tablet 1  . betamethasone dipropionate (DIPROLENE) 0.05 % cream Apply topically as needed.    . cycloSPORINE (RESTASIS) 0.05 % ophthalmic emulsion Place 1 drop into both eyes 2 (two) times daily.      Marland Kitchen esomeprazole (NEXIUM) 40 MG capsule Take 40 mg by mouth as needed.    . hydrochlorothiazide (HYDRODIURIL) 25 MG tablet Take 1 tablet by mouth  daily 30 tablet 0  . Multiple Vitamins-Minerals (HAIR/SKIN/NAILS PO) Take 1 tablet by mouth daily.    . non-metallic deodorant Jethro Poling) MISC Apply 1 application topically daily as needed.    . Potassium 99 MG TABS Take 1 tablet by mouth daily.      . Wound Cleansers (RADIAPLEX EX) Apply topically.     No current  facility-administered medications for this encounter.    Physical Findings: The patient is in no acute distress. Patient is alert and oriented.  weight is 257 lb (116.574 kg). Her oral temperature is 98.6 F (37 C). Her blood pressure is 152/89 and her pulse is 79. Her respiration is 20. Marland Kitchen   Soreness to palpation in UIQ of right breast - pt feels this is in the rib.  Skin over breast has healed nicely   Lab Findings: Lab Results  Component Value Date   WBC 9.7 08/22/2014   HGB 13.9 08/22/2014   HCT 42.7 08/22/2014   MCV 88.7 08/22/2014   PLT 345 08/22/2014    Radiographic Findings: No results found.  Impression/Plan:  I encouraged her to continue with yearly mammography and followup with medical oncology. I will see her back 1 year. I have encouraged her to call if she has any issues or concerns in the future. I wished her the very best until then.  Her rib soreness is most likely inflammation/ irritation from RT.  If it persists/worsens over the next few months, she knows to inform one of her oncologists so when can consider imaging. She is pleased with this plan.   . _____________________________________   Eppie Gibson, MD

## 2014-12-28 NOTE — Progress Notes (Signed)
CLINIC:  Cancer Survivorship   REASON FOR VISIT:  Routine follow-up post-treatment for a recent history of breast cancer.  BRIEF ONCOLOGIC HISTORY:    Breast cancer of lower-outer quadrant of right female breast   08/14/2014 Initial Diagnosis Right breast high-grade invasive carcinoma spindle, epithelioid and squamous differentiation, ER 2%, PR 0%, HER-2 negative ratio 1.57, Ki-67 70%   08/21/2014 Breast MRI Right breast enhancement at the site of the biopsy 1.2 x 1.2 x 1.4 cm enhancing mass, along lateral margin 1.8 cm hematoma   08/29/2014 Surgery Right breast lumpectomy: 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  Right breast boost: Total of 10 Gy in 5 fractions.    10/18/2014 Procedure Genetic testing found a BRCA2 VUS called c.2491G>A, but was otherwise normal    INTERVAL HISTORY:  Miranda Mueller presents to the [facility] today for our initial meeting to review her survivorship care plan detailing her treatment course for breast cancer, as well as monitoring long-term side effects of that treatment, education regarding health maintenance, screening, and overall wellness and health promotion.     Overall, Miranda Mueller reports feeling quite well since completing her radiation therapy approximately one month ago.  She endorses occasional fatigue, but this seems to be improving now that she has completed radiation.  She reports that she has a very small, tender area of redness/inflammation on her right breast in the area where she received radiation, but this is also progressing towards healing.  Miranda Mueller also saw her Radiation Oncologist, Dr. Isidore Moos, today who encouraged her to continue to monitor this area on her breast and report any changes or delayed  healing.  Overall, she reports that she is coping well and has an excellent support system in a friend who is also a breast cancer survivor.    REVIEW OF SYSTEMS:  General: Denies fever, chills, unintentional weight loss. Cardiac: Denies palpitations, chest pain, and lower extremity edema.  Respiratory: Denies cough, shortness of breath, and dyspnea on exertion.  GI: Denies abdominal pain, constipation, diarrhea, nausea, or vomiting.  GU: Denies dysuria, hematuria, vaginal bleeding, vaginal discharge, or vaginal dryness.  Musculoskeletal: Denies joint or bone pain.  Neuro: Denies headache or recent falls. Denies peripheral neuropathy. Skin: Denies rash, pruritis, or open wounds. Endorses area of erythema on right breast, per HPI.  Breast: Denies any new nodularity, masses, tenderness, nipple changes, or nipple discharge.  Psych: Denies depression, anxiety, insomnia, or memory loss.   A 14-point review of systems was completed and was negative, except as noted above.  BRIEF ONCOLOGIC HISTORY:    Breast cancer of lower-outer quadrant of right female breast   08/14/2014 Initial Diagnosis Right breast high-grade invasive carcinoma spindle, epithelioid and squamous differentiation, ER 2%, PR 0%, HER-2 negative ratio 1.57, Ki-67 70%   08/21/2014 Breast MRI Right breast enhancement at the site of the biopsy 1.2 x 1.2 x 1.4 cm enhancing mass, along lateral margin 1.8 cm hematoma   08/29/2014 Surgery Right breast lumpectomy: 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  Right breast boost: Total of 10  Gy in 5 fractions.    10/18/2014 Procedure Genetic testing found a BRCA2 VUS called c.2491G>A, but was otherwise normal     ONCOLOGY TREATMENT TEAM:  1. Surgeon:  Dr.  Saddie Benders at [facility] 2. Medical Oncologist: Dr. Lindi Adie  3. Radiation Oncologist: Dr. Isidore Moos    PAST MEDICAL/SURGICAL HISTORY:  Past Medical History  Diagnosis Date  . GERD (gastroesophageal reflux disease)   . IBS (irritable bowel syndrome)   . PVC (premature ventricular contraction)     Hx of  . History of shingles   . Hyperlipidemia   . Hypertension   . Breast cancer 08/29/14    Right Breast -High Grade Invasive Mammary  . Bradycardia   . Multifocal PVCs   . Hx of radiation therapy 10/01/14- 11/20/14    right breast/50.4 Gy/28 fx; right breast boost/10 Gy/5 fx   Past Surgical History  Procedure Laterality Date  . Cataract extraction  2009    bilateral  . Needle core biopsy  right breast Right 08/14/14  . Wisdom tooth extraction    . Knee arthroscopy      right  . Colonoscopy  2004    normal  . Radioactive seed guided mastectomy with axillary sentinel lymph node biopsy Right 08/29/2014    Procedure: SEED LOCALIZED RIGHT BREAST LUMPECTOMY AND RIGHT AXILLARY SENTTINEL LYMPH NODE BIOPSY;  Surgeon: Excell Seltzer, MD;  Location: [place];  Service: General;  Laterality: Right;  . Breast lumpectomy Right 08/29/14    started radiation 10/01/14  . Eye surgery      2009,2010  . Other surgical history Left 1997    breast,cyst     CURRENT MEDICATIONS:  Outpatient Encounter Prescriptions as of 12/28/2014  Medication Sig Note  . BENICAR 20 MG tablet Take 1 tablet by mouth  daily   . betamethasone dipropionate (DIPROLENE) 0.05 % cream Apply topically as needed.   . cycloSPORINE (RESTASIS) 0.05 % ophthalmic emulsion Place 1 drop into both eyes 2 (two) times daily.     Marland Kitchen esomeprazole (NEXIUM) 40 MG capsule Take 40 mg by mouth as needed.   . hydrochlorothiazide (HYDRODIURIL) 25 MG tablet Take 1 tablet by mouth  daily   . Multiple Vitamins-Minerals (HAIR/SKIN/NAILS PO) Take 1 tablet by mouth daily.   . non-metallic deodorant Jethro Poling) MISC Apply 1  application topically daily as needed.   . Potassium 99 MG TABS Take 1 tablet by mouth daily.     . Wound Cleansers (RADIAPLEX EX) Apply topically. 11/14/2014: Gave another tube 2/3./16     ONCOLOGIC FAMILY HISTORY:  1. Mother: Breast cancer, diagnosed at age 104. 36. Sister: Breast cancer, diagnosed at age 49. 60. Brother: Prostate cancer, diagnosed at age 83. 48. Maternal uncle: Leukemia, diagnosed at age 52.  93. Maternal uncle (different relative): Leukemia, diagnosed at age 59. 57. Cousin: Breast cancer, diagnosed in her 60s. 6. Cousin (different relative): Colon cancer, diagnosed in her 76s, deceased at age 80.  35. Nephew: NHL, diagnosed at age 80, deceased at age 54.    SOCIAL HISTORY:  Miranda Mueller is married and lives with her husband in [place], [place].  They has 1 daughter and 2 grandchildren, ages 71 and 70, who all live in [place] near her.  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  Her husband is a Art gallery manager and continues to work full-time.  She denies any current or history of tobacco, alcohol, or illicit drug use.  PHYSICAL EXAMINATION:  Vital Signs:   Filed Vitals:   12/28/14 1402  BP: 152/89  Pulse: 70  Temp: 98.6 F (37 C)  Resp: 20  Weight:     257 lbs  General: Well-nourished, well-appearing female in no acute distress.  She is unaccompanied in clinic today.   HEENT: Head is atraumatic and normocephalic.  Pupils equal and reactive to light and accomodation. Conjunctivae clear without exudate.  Sclerae anicteric. Oral mucosa is pink, moist, and intact without lesions.  Oropharynx is pink without lesions or erythema.  Lymph: No cervical, supraclavicular, infraclavicular, or axillary lymphadenopathy noted on palpation.  Cardiovascular: Regular rate and rhythm without murmurs, rubs, or gallops. Respiratory: Clear to auscultation bilaterally. Chest expansion symmetric without accessory muscle use on  inspiration or expiration.  GI: Abdomen soft and round. No tenderness to palpation. Bowel sounds normoactive in 4 quadrants. No hepatosplenomegaly.   GU: Deferred.  Musculoskeletal: Muscle strength 5/5 in all extremities. Full ROM noted in all extremities.  Neuro: No focal deficits. Steady gait.  Psych: Mood and affect normal and appropriate for situation.  Extremities: No edema, cyanosis, or clubbing.  Skin: Warm and dry. No open lesions noted.   LABORATORY DATA:  None for this visit.   DIAGNOSTIC IMAGING:  None for this visit.      ASSESSMENT AND PLAN:   1. History of breast cancer:  Miranda Mueller will follow-up with her medical oncologist, Dr. Lindi Adie in clinic in April 2016 with history and physical exam per surveillance protocol.  A comprehensive survivorship care plan and treatment summary was reviewed with the patient today detailing her breast cancer diagnosis, treatment course, potential late/long-term effects of treatment, appropriate follow-up care with recommendations for the future, and patient education resources.  A copy of this summary, along with a letter will be sent to the patient's primary care provider, will be mailed/routed after today's visit.  Miranda Mueller is welcome to return to the Survivorship Clinic in the future, as needed; no follow-up will be scheduled at this time.    2. Fatigue: This is likely multifactorial and an extremely common complaint for cancer survivors at this point in her life after cancer.  Her fatigue is continuing to improve, the further she gets out from treatment.  She was encouraged to engage in physical activity most days of the week.  We discussed her potentially participating in the [facility], a fitness program offered to cancer survivors at several of our local [facility]. She is going to think about it and will call if she is interested, as she currently has a gym membership that she uses that is a women's only facility.   3. Bone  health:  Given Miranda Mueller's age and history of breast cancer, she is at risk for bone demineralization.  Per our records, her last DEXA scan was 01/11/2004 (results unavailable in EMR for viewing, per patient the results were normal).  Given that she is not taking an aromatase inhibitor, she should have follow-up DEXA scans under the care of her PCP, Delia Chimes, NP as clinically indicated. In the meantime, she was encouraged to increase her consumption of foods rich in calcium, as well as increase her weight-bearing activities.  She was given education on specific activities to promote bone health.  4. Cancer screening:  Due to Miranda Mueller's history and her age, she should receive screening for skin cancers, colon cancer, and gynecologic cancers as clinically indicated.  The information and recommendations are listed on the patient's comprehensive  care plan/treatment summary and were reviewed in detail with the patient.    5. Health maintenance and wellness promotion: Miranda Mueller was encouraged to consume 5-7 servings of fruits and vegetables per day. She was also encouraged to engage in moderate to vigorous exercise for 30 minutes per day most days of the week. She was instructed to limit her alcohol consumption and continue to abstain from tobacco use.     6. Support services/counseling: It is not uncommon for this period of the patient's cancer care trajectory to be one of many emotions and stressors.  Miranda Mueller was encouraged to take advantage of our many support services programs, support groups, and/or counseling in coping with her new life as a cancer survivor after completing anti-cancer treatment.  She was offered support today through active listening and expressive supportive counseling.  She was given information regarding our available services and encouraged to contact me with any questions or for help enrolling in any of our support group/programs.    A total of 60 minutes of face-to-face time was  spent with this patient with greater than 50% of that time in counseling and care-coordination.   Mike Craze, NP Survivorship Program [place] [phone]   Note: PRIMARY CARE PROVIDER [place], [place] [phone]

## 2015-01-15 ENCOUNTER — Telehealth: Payer: Self-pay | Admitting: Hematology and Oncology

## 2015-01-15 ENCOUNTER — Ambulatory Visit (HOSPITAL_BASED_OUTPATIENT_CLINIC_OR_DEPARTMENT_OTHER): Payer: Medicare Other | Admitting: Hematology and Oncology

## 2015-01-15 VITALS — BP 116/58 | HR 66 | Temp 97.7°F | Resp 18 | Ht 66.5 in | Wt 256.4 lb

## 2015-01-15 DIAGNOSIS — Z853 Personal history of malignant neoplasm of breast: Secondary | ICD-10-CM

## 2015-01-15 DIAGNOSIS — C50511 Malignant neoplasm of lower-outer quadrant of right female breast: Secondary | ICD-10-CM

## 2015-01-15 NOTE — Progress Notes (Signed)
Patient Care Team: Delia Chimes, NP as PCP - General (Nurse Practitioner) Nicholas Lose, MD as Consulting Physician (Hematology and Oncology) Excell Seltzer, MD as Consulting Physician (General Surgery) Eppie Gibson, MD as Attending Physician (Radiation Oncology) Holley Bouche, NP as Nurse Practitioner (Nurse Practitioner)  DIAGNOSIS: Breast cancer of lower-outer quadrant of right female breast   Staging form: Breast, AJCC 7th Edition     Clinical: Stage IA (T1c, N0, M0) - Unsigned       Staging comments: Staging performed at breast conference on 11.11.15      Pathologic stage from 08/29/2014: Stage IA (T1c, N0, cM0) - Unsigned   SUMMARY OF ONCOLOGIC HISTORY:   Breast cancer of lower-outer quadrant of right female breast   08/14/2014 Initial Diagnosis Right breast high-grade invasive carcinoma spindle, epithelioid and squamous differentiation, ER 2%, PR 0%, HER-2 negative ratio 1.57, Ki-67 70%   08/21/2014 Breast MRI Right breast enhancement at the site of the biopsy 1.2 x 1.2 x 1.4 cm enhancing mass, along lateral margin 1.8 cm hematoma   08/29/2014 Surgery Right breast lumpectomy: 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  Right breast boost: Total of 10 Gy in 5 fractions.    10/18/2014 Procedure Genetic testing found a BRCA2 VUS called [id number], but was otherwise normal    CHIEF COMPLIANT: Follow-up after radiation therapy  INTERVAL HISTORY: Miranda Mueller is a 74 year old with above-mentioned history of right-sided breast cancer underwent lumpectomy and radiation and is currently on surveillance. She is ER/PR negative so she did not need antiestrogen therapy. She is recovered very well from radiation treatment and has no new complaints or  concerns.  REVIEW OF SYSTEMS:   Constitutional: Denies fevers, chills or abnormal weight loss Eyes: Denies blurriness of vision Ears, nose, mouth, throat, and face: Denies mucositis or sore throat Respiratory: Denies cough, dyspnea or wheezes Cardiovascular: Denies palpitation, chest discomfort or lower extremity swelling Gastrointestinal:  Denies nausea, heartburn or change in bowel habits Skin: Denies abnormal skin rashes Lymphatics: Denies new lymphadenopathy or easy bruising Neurological:Denies numbness, tingling or new weaknesses Behavioral/Psych: Mood is stable, no new changes  Breast:  denies any pain or lumps or nodules in either breasts All other systems were reviewed with the patient and are negative.  I have reviewed the past medical history, past surgical history, social history and family history with the patient and they are unchanged from previous note.  ALLERGIES:  has No Known Allergies.  MEDICATIONS:  Current Outpatient Prescriptions  Medication Sig Dispense Refill  . atorvastatin (LIPITOR) 10 MG tablet     . BENICAR 20 MG tablet Take 1 tablet by mouth  daily 90 tablet 1  . betamethasone dipropionate (DIPROLENE) 0.05 % cream Apply topically as needed.    . cycloSPORINE (RESTASIS) 0.05 % ophthalmic emulsion Place 1 drop into both eyes 2 (two) times daily.      Marland Kitchen esomeprazole (NEXIUM) 40 MG capsule Take 40 mg by mouth as needed.    . hydrochlorothiazide (HYDRODIURIL) 25 MG tablet Take 1 tablet by mouth  daily 30 tablet 0  . Multiple Vitamins-Minerals (HAIR/SKIN/NAILS PO) Take 1 tablet by mouth daily.    . non-metallic deodorant Jethro Poling) MISC Apply 1 application topically daily as needed.    . Potassium 99 MG TABS Take 1 tablet by  mouth daily.      . Wound Cleansers (RADIAPLEX EX) Apply topically.     No current facility-administered medications for this visit.    PHYSICAL EXAMINATION: ECOG PERFORMANCE STATUS: 0 - Asymptomatic  Filed Vitals:   01/15/15 0958  BP:  116/58  Pulse: 66  Temp: 97.7 F (36.5 C)  Resp: 18   Filed Weights   01/15/15 0958  Weight: 256 lb 6.4 oz (116.302 kg)    GENERAL:alert, no distress and comfortable SKIN: skin color, texture, turgor are normal, no rashes or significant lesions EYES: normal, Conjunctiva are pink and non-injected, sclera clear OROPHARYNX:no exudate, no erythema and lips, buccal mucosa, and tongue normal  NECK: supple, thyroid normal size, non-tender, without nodularity LYMPH:  no palpable lymphadenopathy in the cervical, axillary or inguinal LUNGS: clear to auscultation and percussion with normal breathing effort HEART: regular rate & rhythm and no murmurs and no lower extremity edema ABDOMEN:abdomen soft, non-tender and normal bowel sounds Musculoskeletal:no cyanosis of digits and no clubbing  NEURO: alert & oriented x 3 with fluent speech, no focal motor/sensory deficits BREAST: No palpable masses or nodules in either right or left breasts. No palpable axillary supraclavicular or infraclavicular adenopathy no breast tenderness or nipple discharge. (exam performed in the presence of a chaperone)  LABORATORY DATA:  I have reviewed the data as listed   Chemistry      Component Value Date/Time   NA 138 08/22/2014 1237   NA 138 08/24/2013 0834   K 3.8 08/22/2014 1237   K 4.0 08/24/2013 0834   CL 102 08/24/2013 0834   CO2 28 08/22/2014 1237   CO2 29 08/24/2013 0834   BUN 16.0 08/22/2014 1237   BUN 15 08/24/2013 0834   CREATININE 1.1 08/22/2014 1237   CREATININE 1.0 08/24/2013 0834      Component Value Date/Time   CALCIUM 9.8 08/22/2014 1237   CALCIUM 9.2 08/24/2013 0834   ALKPHOS 50 08/22/2014 1237   ALKPHOS 47 08/24/2013 0834   AST 17 08/22/2014 1237   AST 21 08/24/2013 0834   ALT 22 08/22/2014 1237   ALT 23 08/24/2013 0834   BILITOT 0.42 08/22/2014 1237   BILITOT 0.8 08/24/2013 0834       Lab Results  Component Value Date   WBC 9.7 08/22/2014   HGB 13.9 08/22/2014   HCT 42.7  08/22/2014   MCV 88.7 08/22/2014   PLT 345 08/22/2014   NEUTROABS 5.5 08/22/2014    ASSESSMENT & PLAN:  Breast cancer of lower-outer quadrant of right female breast 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  Breast cancer surveillance: 1. Breast exam 01/15/2015 is normal 2. Patient to get annual mammograms Oct 2016  Return to clinic in 1 year for continued surveillance.   No orders of the defined types were placed in this encounter.   The patient has a good understanding of the overall plan. she agrees with it. She will call with any problems that may develop before her next visit here.   Rulon Eisenmenger, MD

## 2015-01-15 NOTE — Telephone Encounter (Signed)
Appointments made and avs printed for patient °

## 2015-01-15 NOTE — Assessment & Plan Note (Signed)
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  Breast cancer surveillance: 1. Breast exam 01/15/2015 is normal 2. Patient to get annual mammograms were surveillance.  Return to clinic in 6 months for continued surveillance.

## 2015-02-06 DIAGNOSIS — R05 Cough: Secondary | ICD-10-CM | POA: Diagnosis not present

## 2015-02-06 DIAGNOSIS — J329 Chronic sinusitis, unspecified: Secondary | ICD-10-CM | POA: Diagnosis not present

## 2015-02-15 ENCOUNTER — Encounter: Payer: Self-pay | Admitting: Adult Health

## 2015-02-15 NOTE — Progress Notes (Signed)
A birthday card was mailed to the patient today on behalf of the Survivorship Program at [facility].   Samah Lapiana, NP Survivorship Program [facility] [phone]  

## 2015-03-07 ENCOUNTER — Encounter: Payer: Self-pay | Admitting: Internal Medicine

## 2015-03-21 DIAGNOSIS — C50911 Malignant neoplasm of unspecified site of right female breast: Secondary | ICD-10-CM | POA: Diagnosis not present

## 2015-03-24 ENCOUNTER — Other Ambulatory Visit: Payer: Self-pay | Admitting: Family Medicine

## 2015-03-25 NOTE — Telephone Encounter (Signed)
Left voicemail requesting pt to call office back 

## 2015-03-25 NOTE — Telephone Encounter (Signed)
Pt left v/m returning call and request cb. 

## 2015-03-25 NOTE — Telephone Encounter (Signed)
Received refill request electronically from pharmacy. Last office visit 09/01/13. Medication is not on medication list. PCP listed as Toy Cookey.

## 2015-03-25 NOTE — Telephone Encounter (Signed)
I do not know if she is still mine  If so - schedule f/u summer and refill until then

## 2015-03-26 NOTE — Telephone Encounter (Signed)
Rx declined pt said Dr. Toy Cookey is her new PCP, she will call pharmacy and have them send it to correct pharmacy

## 2015-04-08 ENCOUNTER — Other Ambulatory Visit: Payer: Self-pay

## 2015-05-01 ENCOUNTER — Ambulatory Visit (AMBULATORY_SURGERY_CENTER): Payer: Self-pay | Admitting: *Deleted

## 2015-05-01 VITALS — Ht 66.5 in | Wt 258.0 lb

## 2015-05-01 DIAGNOSIS — Z120 Encounter for screening for malignant neoplasm of stomach: Secondary | ICD-10-CM

## 2015-05-01 DIAGNOSIS — Z1211 Encounter for screening for malignant neoplasm of colon: Secondary | ICD-10-CM

## 2015-05-01 MED ORDER — NA SULFATE-K SULFATE-MG SULF 17.5-3.13-1.6 GM/177ML PO SOLN: 1.0000 | Freq: Once | ORAL | Status: DC

## 2015-05-01 NOTE — Progress Notes (Signed)
No egg or soy allergy. No anesthesia problems.  No home O2.  No diet meds.  

## 2015-05-07 ENCOUNTER — Encounter: Payer: Self-pay | Admitting: Gastroenterology

## 2015-05-13 ENCOUNTER — Telehealth: Payer: Self-pay | Admitting: Family Medicine

## 2015-05-13 NOTE — Telephone Encounter (Signed)
Patient's PCP was Dr.Tower.  Patient saw Gasper Sells one time.  Patient would like to come back.  She'd like to see [facility] instead of Dr.Tower.  Patient said there's no reason.  She just didn't feel comfortable with Dr.Tower.  Can patient switch to [place]?

## 2015-05-13 NOTE — Telephone Encounter (Signed)
That is fine with me if ok with Miranda Mueller nice patient

## 2015-05-13 NOTE — Telephone Encounter (Signed)
Fine with me if okay with Dr. Glori Bickers

## 2015-05-15 ENCOUNTER — Encounter: Payer: Medicare Other | Admitting: Internal Medicine

## 2015-07-22 ENCOUNTER — Encounter: Payer: Self-pay | Admitting: Gastroenterology

## 2015-07-22 ENCOUNTER — Ambulatory Visit (AMBULATORY_SURGERY_CENTER): Payer: Medicare Other | Admitting: Gastroenterology

## 2015-07-22 VITALS — BP 133/62 | HR 71 | Temp 97.1°F | Resp 16 | Ht 66.0 in | Wt 258.0 lb

## 2015-07-22 DIAGNOSIS — Z1211 Encounter for screening for malignant neoplasm of colon: Secondary | ICD-10-CM | POA: Diagnosis not present

## 2015-07-22 DIAGNOSIS — K5730 Diverticulosis of large intestine without perforation or abscess without bleeding: Secondary | ICD-10-CM

## 2015-07-22 DIAGNOSIS — D122 Benign neoplasm of ascending colon: Secondary | ICD-10-CM

## 2015-07-22 DIAGNOSIS — I10 Essential (primary) hypertension: Secondary | ICD-10-CM | POA: Diagnosis not present

## 2015-07-22 DIAGNOSIS — K219 Gastro-esophageal reflux disease without esophagitis: Secondary | ICD-10-CM | POA: Diagnosis not present

## 2015-07-22 DIAGNOSIS — K635 Polyp of colon: Secondary | ICD-10-CM | POA: Diagnosis not present

## 2015-07-22 DIAGNOSIS — I4940 Unspecified premature depolarization: Secondary | ICD-10-CM | POA: Diagnosis not present

## 2015-07-22 DIAGNOSIS — D123 Benign neoplasm of transverse colon: Secondary | ICD-10-CM | POA: Diagnosis not present

## 2015-07-22 DIAGNOSIS — D124 Benign neoplasm of descending colon: Secondary | ICD-10-CM

## 2015-07-22 DIAGNOSIS — G4733 Obstructive sleep apnea (adult) (pediatric): Secondary | ICD-10-CM | POA: Diagnosis not present

## 2015-07-22 DIAGNOSIS — K589 Irritable bowel syndrome without diarrhea: Secondary | ICD-10-CM | POA: Diagnosis not present

## 2015-07-22 MED ORDER — SODIUM CHLORIDE 0.9 % IV SOLN: 500.0000 mL | INTRAVENOUS | Status: DC

## 2015-07-22 NOTE — Patient Instructions (Signed)
YOU HAD AN ENDOSCOPIC PROCEDURE TODAY AT THE  ENDOSCOPY CENTER:   Refer to the procedure report that was given to you for any specific questions about what was found during the examination.  If the procedure report does not answer your questions, please call your gastroenterologist to clarify.  If you requested that your care partner not be given the details of your procedure findings, then the procedure report has been included in a sealed envelope for you to review at your convenience later.  YOU SHOULD EXPECT: Some feelings of bloating in the abdomen. Passage of more gas than usual.  Walking can help get rid of the air that was put into your GI tract during the procedure and reduce the bloating. If you had a lower endoscopy (such as a colonoscopy or flexible sigmoidoscopy) you may notice spotting of blood in your stool or on the toilet paper. If you underwent a bowel prep for your procedure, you may not have a normal bowel movement for a few days.  Please Note:  You might notice some irritation and congestion in your nose or some drainage.  This is from the oxygen used during your procedure.  There is no need for concern and it should clear up in a day or so.  SYMPTOMS TO REPORT IMMEDIATELY:   Following lower endoscopy (colonoscopy or flexible sigmoidoscopy):  Excessive amounts of blood in the stool  Significant tenderness or worsening of abdominal pains  Swelling of the abdomen that is new, acute  Fever of 100F or higher   For urgent or emergent issues, a gastroenterologist can be reached at any hour by calling [phone].   DIET: Your first meal following the procedure should be a small meal and then it is ok to progress to your normal diet. Heavy or fried foods are harder to digest and may make you feel nauseous or bloated.  Likewise, meals heavy in dairy and vegetables can increase bloating.  Drink plenty of fluids but you should avoid alcoholic beverages for 24  hours.  ACTIVITY:  You should plan to take it easy for the rest of today and you should NOT DRIVE or use heavy machinery until tomorrow (because of the sedation medicines used during the test).    FOLLOW UP: Our staff will call the number listed on your records the next business day following your procedure to check on you and address any questions or concerns that you may have regarding the information given to you following your procedure. If we do not reach you, we will leave a message.  However, if you are feeling well and you are not experiencing any problems, there is no need to return our call.  We will assume that you have returned to your regular daily activities without incident.  If any biopsies were taken you will be contacted by phone or by letter within the next 1-3 weeks.  Please call us at [phone] if you have not heard about the biopsies in 3 weeks.    SIGNATURES/CONFIDENTIALITY: You and/or your care partner have signed paperwork which will be entered into your electronic medical record.  These signatures attest to the fact that that the information above on your After Visit Summary has been reviewed and is understood.  Full responsibility of the confidentiality of this discharge information lies with you and/or your care-partner. 

## 2015-07-22 NOTE — Progress Notes (Signed)
Now NSR

## 2015-07-22 NOTE — Op Note (Addendum)
[place]  [address] [place], [address]   COLONOSCOPY PROCEDURE REPORT  PATIENT: Miranda Mueller, Miranda Mueller  MR#: [id number] BIRTHDATE: Apr 22, 1941 , 66  yrs. old GENDER: female ENDOSCOPIST: Patrcia Dolly, MD REFERRED AO:ZHYQM Toy Cookey, NP PROCEDURE DATE:  07/22/2015 PROCEDURE:   Colonoscopy, screening, Colonoscopy with biopsy, and Colonoscopy with snare polypectomy First Screening Colonoscopy - Avg.  risk and is 50 yrs.  old or older - No.  Prior Negative Screening - Now for repeat screening. 10 or more years since last screening  History of Adenoma - Now for follow-up colonoscopy & has been > or = to 3 yrs.  N/A  Polyps removed today? Yes ASA CLASS:   Class II INDICATIONS:Screening for colonic neoplasia and Colorectal Neoplasm Risk Assessment for this procedure is average risk. MEDICATIONS: Monitored anesthesia care, Propofol 550 mg IV, and Lidocaine 100 mg IV DESCRIPTION OF PROCEDURE:   After the risks benefits and alternatives of the procedure were thoroughly explained, informed consent was obtained.  The digital rectal exam revealed no abnormalities of the rectum.   The LB [id number]  endoscope was introduced through the anus and advanced to the terminal ileum which was intubated for a short distance. No adverse events experienced.   The quality of the prep was good.  The instrument was then slowly withdrawn as the colon was fully examined. Estimated blood loss is zero unless otherwise noted in this procedure report.  COLON FINDINGS: Two sessile polyps ranging between 3-58mm in size were found in the descending colon and ascending colon. Polypectomies were performed with a cold snare.  The resection was complete, the polyp tissue was completely retrieved and sent to histology.   Four sessile polyps ranging between 3-31mm in size were found in the descending colon, transverse colon, and ascending colon.  Polypectomies were performed with cold forceps.   The resection was complete, the polyp tissue was completely retrieved and sent to histology.   There was mild diverticulosis noted throughout the entire examined colon.   The examination was otherwise normal.   The examination was otherwise normal. Retroflexed views revealed internal hemorrhoids. The time to cecum = 11.7 Withdrawal time = 22.8   The scope was withdrawn and the procedure completed. COMPLICATIONS: There were no immediate complications.  ENDOSCOPIC IMPRESSION: 1. Two sessile polyps ranging between 3-24mm in size were found in the descending colon and ascending colon; polypectomies were performed with a cold snare 2.   Four sessile polyps ranging between 3-45mm in size were found in the descending colon, transverse colon, and ascending colon; polypectomies were performed with cold forceps 3.   There was mild diverticulosis noted throughout the entire examined colon 4.   The examination was otherwise normal  RECOMMENDATIONS: If the polyp(s) removed today are proven to be adenomatous (pre-cancerous) polyps, you will need a colonoscopy in 3 years. Otherwise you should continue to follow colorectal cancer screening guidelines for "routine risk" patients with a colonoscopy in 10 years.  You will receive a letter within 1-2 weeks with the results of your biopsy as well as final recommendations.  Please call my office if you have not received a letter after 3 weeks.  eSigned:  Harl Bowie, MD 07/22/2015 11:26 AM Revised: 07/22/2015 11:26 AM  cc:   PATIENT NAME:  Lisset, Ketchem MR#: [id number]

## 2015-07-22 NOTE — Progress Notes (Signed)
Called to room to assist during endoscopic procedure.  Patient ID and intended procedure confirmed with present staff. Received instructions for my participation in the procedure from the performing physician.  

## 2015-07-22 NOTE — Progress Notes (Signed)
Noted NSR with bigeminy - BP stable. Noted less PVC's once procedure completed - MD aware. Stable to RR

## 2015-07-22 NOTE — Progress Notes (Signed)
Patient alert and oriented, abdomin soft, passing flatus. Patient continues with bigemial PVC. Hilma Favors CRNA aware.

## 2015-07-23 ENCOUNTER — Telehealth: Payer: Self-pay | Admitting: *Deleted

## 2015-07-23 NOTE — Telephone Encounter (Signed)
  Follow up Call-  Call back number 07/22/2015  Post procedure Call Back phone  # [phone]  Permission to leave phone message Yes     Patient questions:  Do you have a fever, pain , or abdominal swelling? No. Pain Score  0 *  Have you tolerated food without any problems? Yes.    Have you been able to return to your normal activities? Yes.    Do you have any questions about your discharge instructions: Diet   No. Medications  No. Follow up visit  No.  Do you have questions or concerns about your Care? No.  Actions: * If pain score is 4 or above: No action needed, pain <4.

## 2015-07-24 ENCOUNTER — Other Ambulatory Visit: Payer: Self-pay | Admitting: Gastroenterology

## 2015-07-24 DIAGNOSIS — D124 Benign neoplasm of descending colon: Secondary | ICD-10-CM | POA: Diagnosis not present

## 2015-07-24 DIAGNOSIS — D123 Benign neoplasm of transverse colon: Secondary | ICD-10-CM | POA: Diagnosis not present

## 2015-07-24 DIAGNOSIS — D122 Benign neoplasm of ascending colon: Secondary | ICD-10-CM | POA: Diagnosis not present

## 2015-07-29 DIAGNOSIS — H04123 Dry eye syndrome of bilateral lacrimal glands: Secondary | ICD-10-CM | POA: Diagnosis not present

## 2015-07-30 ENCOUNTER — Encounter: Payer: Self-pay | Admitting: Gastroenterology

## 2015-07-31 ENCOUNTER — Telehealth: Payer: Self-pay | Admitting: Gastroenterology

## 2015-07-31 NOTE — Telephone Encounter (Signed)
Discussed letter/biopsy results with the patient.  All questions answered.  She will call back for any additional questions or concerns

## 2015-08-19 DIAGNOSIS — Z853 Personal history of malignant neoplasm of breast: Secondary | ICD-10-CM | POA: Diagnosis not present

## 2015-08-20 ENCOUNTER — Encounter: Payer: Self-pay | Admitting: Internal Medicine

## 2015-08-27 ENCOUNTER — Encounter: Payer: Self-pay | Admitting: Internal Medicine

## 2015-08-27 ENCOUNTER — Ambulatory Visit (INDEPENDENT_AMBULATORY_CARE_PROVIDER_SITE_OTHER): Payer: Medicare Other | Admitting: Internal Medicine

## 2015-08-27 VITALS — BP 126/70 | HR 86 | Temp 98.1°F | Ht 66.0 in | Wt 257.0 lb

## 2015-08-27 DIAGNOSIS — I10 Essential (primary) hypertension: Secondary | ICD-10-CM | POA: Diagnosis not present

## 2015-08-27 DIAGNOSIS — E785 Hyperlipidemia, unspecified: Secondary | ICD-10-CM

## 2015-08-27 DIAGNOSIS — K219 Gastro-esophageal reflux disease without esophagitis: Secondary | ICD-10-CM | POA: Diagnosis not present

## 2015-08-27 DIAGNOSIS — G4733 Obstructive sleep apnea (adult) (pediatric): Secondary | ICD-10-CM | POA: Insufficient documentation

## 2015-08-27 DIAGNOSIS — M47818 Spondylosis without myelopathy or radiculopathy, sacral and sacrococcygeal region: Secondary | ICD-10-CM

## 2015-08-27 DIAGNOSIS — M4698 Unspecified inflammatory spondylopathy, sacral and sacrococcygeal region: Secondary | ICD-10-CM

## 2015-08-27 DIAGNOSIS — C50511 Malignant neoplasm of lower-outer quadrant of right female breast: Secondary | ICD-10-CM | POA: Diagnosis not present

## 2015-08-27 DIAGNOSIS — Z23 Encounter for immunization: Secondary | ICD-10-CM | POA: Diagnosis not present

## 2015-08-27 NOTE — Patient Instructions (Signed)

## 2015-08-27 NOTE — Assessment & Plan Note (Signed)
Advised her to take Nexium daily and Rolaids prn Discussed how weight loss could help improve her reflux

## 2015-08-27 NOTE — Addendum Note (Signed)
Addended by: Lurlean Nanny on: 08/27/2015 05:09 PM   Modules accepted: Orders

## 2015-08-27 NOTE — Assessment & Plan Note (Signed)
Well controlled on Benicar and HCT Will check CBC and CMET today

## 2015-08-27 NOTE — Assessment & Plan Note (Signed)
She is not on CPAP Discussed how weight loss could improve her OSA

## 2015-08-27 NOTE — Progress Notes (Signed)
Pre visit review using our clinic review tool, if applicable. No additional management support is needed unless otherwise documented below in the visit note. 

## 2015-08-27 NOTE — Progress Notes (Signed)
HPI  Pt presents to the clinic today to establish care and for management of the conditions listed below. She is transferring care from Dr. Glori Bickers.  GERD: Her reflux seems worse when she eat late at night. It is also triggerd by caffeine. She does not take her Nexium daily. She takes Rolaids as needed.   HLD: Her last LDL was 118. She is prescribed Lipitor but she reports she does not take it. She is taking a baby ASA daily. She does try to consume a low fat diet.  HTN: She takes Benicar and HCTZ. ECG from 08/2014 reviewed. She denies chest pain or shortness of breath.  Breast cancer, right: She follows with surgical oncology, oncology, and radiology. She had a mammogram 08/2015- results reviewed in [facility].  OSA: She does not use CPAP.   She does c/p pain in her lower back. This started a few months ago. The pain is worse with walking. She feels like her back catches. She denies injury to the area. She denies numbness or tingling in her legs. She takes Aleve and Tylenol Arthritis with good relief.  Flu: 2014 Tetanus: 2013 Pneumovax: 2011 Prevnar: never Zostovax: 2009 Mammogram: 01/2015 Pap Smear: 04/2012 Bone Density: 2005 Colon Screening: 07/2015 Vision Screening: yearly Dentist: biannually  Past Medical History  Diagnosis Date  . GERD (gastroesophageal reflux disease)   . IBS (irritable bowel syndrome)   . PVC (premature ventricular contraction)     Hx of  . History of shingles   . Hyperlipidemia   . Hypertension   . Bradycardia   . Multifocal PVCs   . Hx of radiation therapy 10/01/14- 11/20/14    right breast/50.4 Gy/28 fx; right breast boost/10 Gy/5 fx  . Breast cancer ([place]) 08/29/14    Right Breast -High Grade Invasive Mammary  . Cataract     surgery  . Sleep apnea     no cpap per pt     Current Outpatient Prescriptions  Medication Sig Dispense Refill  . aspirin 81 MG tablet Take 81 mg by mouth daily.    Marland Kitchen atorvastatin (LIPITOR) 10 MG tablet     . BENICAR 20 MG  tablet Take 1 tablet by mouth  daily 90 tablet 1  . betamethasone dipropionate (DIPROLENE) 0.05 % cream Apply topically as needed.    . cycloSPORINE (RESTASIS) 0.05 % ophthalmic emulsion Place 1 drop into both eyes 2 (two) times daily.      Marland Kitchen esomeprazole (NEXIUM) 40 MG capsule Take 40 mg by mouth as needed.    . hydrochlorothiazide (HYDRODIURIL) 25 MG tablet Take 1 tablet by mouth  daily 30 tablet 0  . Multiple Vitamins-Minerals (HAIR/SKIN/NAILS PO) Take 1 tablet by mouth daily.    . non-metallic deodorant Jethro Poling) MISC Apply 1 application topically daily as needed.    . Potassium 99 MG TABS Take 1 tablet by mouth daily.       No current facility-administered medications for this visit.    No Known Allergies  Family History  Problem Relation Age of Onset  . Hypertension Mother   . Heart disease Mother     CAD  . Breast cancer Mother 60  . Hypertension Sister   . Breast cancer Sister 27  . Hypertension Brother   . Hypertension Brother   . Prostate cancer Brother 69  . Leukemia Maternal Uncle 57  . Heart disease Maternal Grandmother   . Leukemia Maternal Uncle 22  . Breast cancer Cousin     dx under 48  .  Colon cancer Cousin     dx in her 26s  . Colon cancer Cousin   . Lymphoma Other 49    NHL  . Colon polyps Daughter   . Aneurysm Sister   . Heart disease Sister   . Stroke Father     Social History   Social History  . Marital Status: Married    Spouse Name: Homer  . Number of Children: 1  . Years of Education: 12   Occupational History  .      retired   Social History Main Topics  . Smoking status: Never Smoker   . Smokeless tobacco: Never Used  . Alcohol Use: No  . Drug Use: No  . Sexual Activity: Not on file   Other Topics Concern  . Not on file   Social History Narrative   Consumes one glass of caffeine daily    ROS:  Constitutional: Denies fever, malaise, fatigue, headache or abrupt weight changes.  HEENT: Denies eye pain, eye redness, ear pain,  ringing in the ears, wax buildup, runny nose, nasal congestion, bloody nose, or sore throat. Respiratory: Denies difficulty breathing, shortness of breath, cough or sputum production.   Cardiovascular: Denies chest pain, chest tightness, palpitations or swelling in the hands or feet.  Gastrointestinal: Denies abdominal pain, bloating, constipation, diarrhea or blood in the stool.  GU: Denies frequency, urgency, pain with urination, blood in urine, odor or discharge. Musculoskeletal: Pt reports back pain. Denies decrease in range of motion, difficulty with gait, muscle pain or joint swelling.  Skin: Denies redness, rashes, lesions or ulcercations.  Neurological: Denies dizziness, difficulty with memory, difficulty with speech or problems with balance and coordination.  Psych: Denies anxiety, depression, SI/HI.  No other specific complaints in a complete review of systems (except as listed in HPI above).  PE:  BP 126/70 mmHg  Pulse 86  Temp(Src) 98.1 F (36.7 C) (Oral)  Ht 5\' 6"  (1.676 m)  Wt 257 lb (116.574 kg)  BMI 41.50 kg/m2  SpO2 98%  Wt Readings from Last 3 Encounters:  08/27/15 257 lb (116.574 kg)  07/22/15 258 lb (117.028 kg)  05/01/15 258 lb (117.028 kg)    General: Appears her stated age, obese in NAD. Skin: Warm, dry and intact. Cardiovascular: Normal rate and rhythm. S1,S2 noted.  No murmur, rubs or gallops noted.  Pulmonary/Chest: Normal effort and positive vesicular breath sounds. No respiratory distress. No wheezes, rales or ronchi noted.  Abdomen: Soft and nontender. Normal bowel sounds. Musculoskeletal: Normal flexion, extension and rotation of the spine. No bony tenderness noted over the spine. No pain with palpation of the paralumbar muscles. Pain with palpation over both SI joints. No difficulty with gait.  Neurological: Alert and oriented.  Psychiatric: Mood and affect normal. Behavior is normal. Judgment and thought content normal.    BMET    Component  Value Date/Time   NA 138 08/22/2014 1237   NA 138 08/24/2013 0834   K 3.8 08/22/2014 1237   K 4.0 08/24/2013 0834   CL 102 08/24/2013 0834   CO2 28 08/22/2014 1237   CO2 29 08/24/2013 0834   GLUCOSE 102 08/22/2014 1237   GLUCOSE 99 08/24/2013 0834   BUN 16.0 08/22/2014 1237   BUN 15 08/24/2013 0834   CREATININE 1.1 08/22/2014 1237   CREATININE 1.0 08/24/2013 0834   CALCIUM 9.8 08/22/2014 1237   CALCIUM 9.2 08/24/2013 0834   GFRNONAA 53.06 02/17/2010 1454   GFRAA 72 12/01/2007 1105    Lipid Panel  Component Value Date/Time   CHOL 184 08/24/2013 0834   TRIG 141.0 08/24/2013 0834   HDL 38.10* 08/24/2013 0834   CHOLHDL 5 08/24/2013 0834   VLDL 28.2 08/24/2013 0834   LDLCALC 118* 08/24/2013 0834    CBC    Component Value Date/Time   WBC 9.7 08/22/2014 1237   WBC 8.1 08/24/2013 0834   RBC 4.82 08/22/2014 1237   RBC 4.74 08/24/2013 0834   HGB 13.9 08/22/2014 1237   HGB 14.1 08/24/2013 0834   HCT 42.7 08/22/2014 1237   HCT 41.6 08/24/2013 0834   PLT 345 08/22/2014 1237   PLT 334.0 08/24/2013 0834   MCV 88.7 08/22/2014 1237   MCV 87.8 08/24/2013 0834   MCH 29.0 08/22/2014 1237   MCHC 32.7 08/22/2014 1237   MCHC 34.0 08/24/2013 0834   RDW 13.8 08/22/2014 1237   RDW 13.8 08/24/2013 0834   LYMPHSABS 2.9 08/22/2014 1237   LYMPHSABS 2.5 08/24/2013 0834   MONOABS 0.8 08/22/2014 1237   MONOABS 0.5 08/24/2013 0834   EOSABS 0.4 08/22/2014 1237   EOSABS 0.4 08/24/2013 0834   BASOSABS 0.1 08/22/2014 1237   BASOSABS 0.0 08/24/2013 0834    Hgb A1C No results found for: HGBA1C   Assessment and Plan:  SI Joint arthritis:  Continue Aleve daily Take Tylenol arthritis prn for pain  She has an appt for her medicare wellness exam

## 2015-08-27 NOTE — Assessment & Plan Note (Signed)
She will continue to follow with oncology. 

## 2015-08-27 NOTE — Assessment & Plan Note (Signed)
She is not taking her Lipitor Will check Lipid Profile and CMET today Encouraged her to consume a low fat diet

## 2015-08-28 ENCOUNTER — Telehealth: Payer: Self-pay | Admitting: Internal Medicine

## 2015-08-28 LAB — COMPREHENSIVE METABOLIC PANEL
ALBUMIN: 4.1 g/dL (ref 3.5–5.2)
ALK PHOS: 41 U/L (ref 39–117)
ALT: 18 U/L (ref 0–35)
AST: 18 U/L (ref 0–37)
BILIRUBIN TOTAL: 0.4 mg/dL (ref 0.2–1.2)
BUN: 18 mg/dL (ref 6–23)
CO2: 29 mEq/L (ref 19–32)
Calcium: 9.5 mg/dL (ref 8.4–10.5)
Chloride: 100 mEq/L (ref 96–112)
Creatinine, Ser: 1.1 mg/dL (ref 0.40–1.20)
GFR: 51.68 mL/min — ABNORMAL LOW (ref >60.00)
GLUCOSE: 89 mg/dL (ref 70–99)
POTASSIUM: 3.7 meq/L (ref 3.5–5.1)
SODIUM: 138 meq/L (ref 135–145)
TOTAL PROTEIN: 7.4 g/dL (ref 6.0–8.3)

## 2015-08-28 LAB — LIPID PANEL
CHOL/HDL RATIO: 5
CHOLESTEROL: 202 mg/dL — AB (ref 0–200)
HDL: 38.6 mg/dL — AB (ref >39.00)
LDL Cholesterol: 124 mg/dL — ABNORMAL HIGH (ref 0–99)
NONHDL: 162.9
Triglycerides: 194 mg/dL — ABNORMAL HIGH (ref 0.0–149.0)
VLDL: 38.8 mg/dL (ref 0.0–40.0)

## 2015-08-28 LAB — CBC
HEMATOCRIT: 41.4 % (ref 36.0–46.0)
Hemoglobin: 13.8 g/dL (ref 12.0–15.0)
MCHC: 33.3 g/dL (ref 30.0–36.0)
MCV: 88.5 fl (ref 78.0–100.0)
Platelets: 319 10*3/uL (ref 150.0–400.0)
RBC: 4.68 Mil/uL (ref 3.87–5.11)
RDW: 13.4 % (ref 11.5–15.5)
WBC: 8 10*3/uL (ref 4.0–10.5)

## 2015-08-28 LAB — HEMOGLOBIN A1C: HEMOGLOBIN A1C: 5.5 % (ref 4.6–6.5)

## 2015-08-28 NOTE — Telephone Encounter (Signed)
Pt returned your call.  

## 2015-08-29 NOTE — Addendum Note (Signed)
Addended by: Lurlean Nanny on: 08/29/2015 04:47 PM   Modules accepted: Medications

## 2015-09-19 ENCOUNTER — Encounter: Payer: Self-pay | Admitting: Internal Medicine

## 2015-09-19 ENCOUNTER — Ambulatory Visit (INDEPENDENT_AMBULATORY_CARE_PROVIDER_SITE_OTHER): Payer: Medicare Other | Admitting: Internal Medicine

## 2015-09-19 VITALS — BP 128/72 | HR 62 | Temp 98.3°F | Ht 66.0 in | Wt 256.0 lb

## 2015-09-19 DIAGNOSIS — Z0000 Encounter for general adult medical examination without abnormal findings: Secondary | ICD-10-CM

## 2015-09-19 MED ORDER — HYDROCHLOROTHIAZIDE 25 MG PO TABS: 25.0000 mg | ORAL_TABLET | Freq: Every day | ORAL | Status: DC

## 2015-09-19 MED ORDER — OLMESARTAN MEDOXOMIL 20 MG PO TABS: ORAL_TABLET | ORAL | Status: DC

## 2015-09-19 MED ORDER — ATORVASTATIN CALCIUM 10 MG PO TABS: 10.0000 mg | ORAL_TABLET | Freq: Every day | ORAL | Status: DC

## 2015-09-19 NOTE — Patient Instructions (Signed)

## 2015-09-19 NOTE — Progress Notes (Signed)
HPI:  Pt presents to the clinic today for her Medicare Wellness Exam.    Past Medical History  Diagnosis Date  . GERD (gastroesophageal reflux disease)   . IBS (irritable bowel syndrome)   . PVC (premature ventricular contraction)     Hx of  . History of shingles   . Hyperlipidemia   . Hypertension   . Bradycardia   . Multifocal PVCs   . Hx of radiation therapy 10/01/14- 11/20/14    right breast/50.4 Gy/28 fx; right breast boost/10 Gy/5 fx  . Breast cancer ([place]) 08/29/14    Right Breast -High Grade Invasive Mammary  . Cataract     surgery  . Sleep apnea     no cpap per pt     Current Outpatient Prescriptions  Medication Sig Dispense Refill  . aspirin 81 MG tablet Take 81 mg by mouth daily.    Marland Kitchen atorvastatin (LIPITOR) 10 MG tablet Take 10 mg by mouth daily.    Marland Kitchen BENICAR 20 MG tablet Take 1 tablet by mouth  daily 90 tablet 1  . betamethasone dipropionate (DIPROLENE) 0.05 % cream Apply topically as needed.    . cycloSPORINE (RESTASIS) 0.05 % ophthalmic emulsion Place 1 drop into both eyes 2 (two) times daily.      Marland Kitchen esomeprazole (NEXIUM) 40 MG capsule Take 40 mg by mouth as needed.    . hydrochlorothiazide (HYDRODIURIL) 25 MG tablet Take 1 tablet by mouth  daily 30 tablet 0  . Multiple Vitamins-Minerals (HAIR/SKIN/NAILS PO) Take 1 tablet by mouth daily.    . non-metallic deodorant Jethro Poling) MISC Apply 1 application topically daily as needed.    . Potassium 99 MG TABS Take 1 tablet by mouth daily.       No current facility-administered medications for this visit.    No Known Allergies  Family History  Problem Relation Age of Onset  . Hypertension Mother   . Heart disease Mother     CAD  . Breast cancer Mother 36  . Hypertension Sister   . Breast cancer Sister 25  . Aneurysm Sister   . Hypertension Brother   . Prostate cancer Brother 55  . Leukemia Maternal Uncle 61  . Heart disease Maternal Grandmother   . Leukemia Maternal Uncle 5  . Breast cancer Cousin     dx  under 72  . Colon cancer Cousin     dx in her 72s  . Colon cancer Cousin   . Lymphoma Other 49    NHL  . Colon polyps Daughter   . Stroke Father     Social History   Social History  . Marital Status: Married    Spouse Name: Homer  . Number of Children: 1  . Years of Education: 12   Occupational History  .      retired   Social History Main Topics  . Smoking status: Never Smoker   . Smokeless tobacco: Never Used  . Alcohol Use: No  . Drug Use: No  . Sexual Activity: No   Other Topics Concern  . Not on file   Social History Narrative   Consumes one glass of caffeine daily    Hospitiliaztions: None  Health Maintenance:    Flu: 08/2015  Tetanus: 2013  Pneumovax: 2011  Prevnar: 08/2015  Zostovax: 2009  Mammogram: 01/2015  Pap Smear: 04/2012  Bone Density: 2005  Colon Screening: 07/2015  Vision Screening: yearly  Dentist: biannually   Providers:   PCP: Webb Silversmith, NP-C  Oncologist: Dr.  [id number]  Neurologist: Dr. Rexene Alberts  Dermatoligist: Dr. Ronnald Ramp  Gastroenterologist: Dr. Silverio Decamp  Opthalmalogist: Dr. Bing Plume  Orthopedic: Dr. Paticia Stack   I have personally reviewed and have noted:  1. The patient's medical and social history 2. Their use of alcohol, tobacco or illicit drugs 3. Their current medications and supplements 4. The patient's functional ability including ADL's, fall risks, home safety risks  and hearing or visual impairment. 5. Diet and physical activities 6. Evidence for depression or mood disorder  Subjective:   Review of Systems:   Constitutional: Denies fever, malaise, fatigue, headache or abrupt weight changes.  HEENT: Denies eye pain, eye redness, ear pain, ringing in the ears, wax buildup, runny nose, nasal congestion, bloody nose, or sore throat. Respiratory: Denies difficulty breathing, shortness of breath, cough or sputum production.   Cardiovascular: Denies chest pain, chest tightness, palpitations or swelling in the hands or feet.   Gastrointestinal: Denies abdominal pain, bloating, constipation, diarrhea or blood in the stool.  GU: Denies frequency, urgency, pain with urination, blood in urine, odor or discharge. Musculoskeletal: Pt reports back pain. Denies decrease in range of motion, difficulty with gait, muscle pain or joint swelling.  Skin: Denies redness, rashes, lesions or ulcercations.  Neurological: Denies dizziness, difficulty with memory, difficulty with speech or problems with balance and coordination.  Psych: Denies anxiety, depression, SI/HI.   No other specific complaints in a complete review of systems (except as listed in HPI above).  Objective:  PE:   BP 128/72 mmHg  Pulse 62  Temp(Src) 98.3 F (36.8 C) (Oral)  Ht 5\' 6"  (1.676 m)  Wt 256 lb (116.121 kg)  BMI 41.34 kg/m2  Wt Readings from Last 3 Encounters:  08/27/15 257 lb (116.574 kg)  07/22/15 258 lb (117.028 kg)  05/01/15 258 lb (117.028 kg)    General: Appears her stated age, obese in NAD. Cardiovascular: Normal rate and rhythm. S1,S2 noted.  No murmur, rubs or gallops noted.   Pulmonary/Chest: Normal effort and positive vesicular breath sounds. No respiratory distress. No wheezes, rales or ronchi noted.  Neurological: Alert and oriented.  Psychiatric: Mood and affect normal. Behavior is normal. Judgment and thought content normal.    BMET    Component Value Date/Time   NA 138 08/27/2015 1601   NA 138 08/22/2014 1237   K 3.7 08/27/2015 1601   K 3.8 08/22/2014 1237   CL 100 08/27/2015 1601   CO2 29 08/27/2015 1601   CO2 28 08/22/2014 1237   GLUCOSE 89 08/27/2015 1601   GLUCOSE 102 08/22/2014 1237   BUN 18 08/27/2015 1601   BUN 16.0 08/22/2014 1237   CREATININE 1.10 08/27/2015 1601   CREATININE 1.1 08/22/2014 1237   CALCIUM 9.5 08/27/2015 1601   CALCIUM 9.8 08/22/2014 1237   GFRNONAA 53.06 02/17/2010 1454   GFRAA 72 12/01/2007 1105    Lipid Panel     Component Value Date/Time   CHOL 202* 08/27/2015 1601   TRIG  194.0* 08/27/2015 1601   HDL 38.60* 08/27/2015 1601   CHOLHDL 5 08/27/2015 1601   VLDL 38.8 08/27/2015 1601   LDLCALC 124* 08/27/2015 1601    CBC    Component Value Date/Time   WBC 8.0 08/27/2015 1601   WBC 9.7 08/22/2014 1237   RBC 4.68 08/27/2015 1601   RBC 4.82 08/22/2014 1237   HGB 13.8 08/27/2015 1601   HGB 13.9 08/22/2014 1237   HCT 41.4 08/27/2015 1601   HCT 42.7 08/22/2014 1237   PLT 319.0 08/27/2015 1601   PLT 345  08/22/2014 1237   MCV 88.5 08/27/2015 1601   MCV 88.7 08/22/2014 1237   MCH 29.0 08/22/2014 1237   MCHC 33.3 08/27/2015 1601   MCHC 32.7 08/22/2014 1237   RDW 13.4 08/27/2015 1601   RDW 13.8 08/22/2014 1237   LYMPHSABS 2.9 08/22/2014 1237   LYMPHSABS 2.5 08/24/2013 0834   MONOABS 0.8 08/22/2014 1237   MONOABS 0.5 08/24/2013 0834   EOSABS 0.4 08/22/2014 1237   EOSABS 0.4 08/24/2013 0834   BASOSABS 0.1 08/22/2014 1237   BASOSABS 0.0 08/24/2013 0834    Hgb A1C Lab Results  Component Value Date   HGBA1C 5.5 08/27/2015      Assessment and Plan:   Medicare Annual Wellness Visit:  Diet: Heart healthy Physical activity: Sedentary Depression/mood screen: Negative Hearing: Intact to whispered voice Visual acuity: Grossly normal, performs annual eye exam  ADLs: Capable Fall risk: None Home safety: Good Cognitive evaluation: Intact to orientation, naming, recall and repetition EOL planning: She has a Living Will and HCPOA, full code/ I agree  Preventative Medicine: We will schedule her bone density exam at [facility] for 03/2016 when she gets her mammogram. All other HM UTD    Next appointment: 6 month follow up of chronic conditions

## 2015-09-19 NOTE — Progress Notes (Signed)
Pre visit review using our clinic review tool, if applicable. No additional management support is needed unless otherwise documented below in the visit note. 

## 2015-09-20 ENCOUNTER — Other Ambulatory Visit: Payer: Self-pay | Admitting: Internal Medicine

## 2015-09-20 DIAGNOSIS — Z780 Asymptomatic menopausal state: Secondary | ICD-10-CM

## 2015-09-20 DIAGNOSIS — Z13820 Encounter for screening for osteoporosis: Secondary | ICD-10-CM

## 2015-10-02 DIAGNOSIS — C50911 Malignant neoplasm of unspecified site of right female breast: Secondary | ICD-10-CM | POA: Diagnosis not present

## 2015-12-20 ENCOUNTER — Ambulatory Visit: Payer: Self-pay | Admitting: Radiation Oncology

## 2016-01-03 ENCOUNTER — Encounter: Payer: Self-pay | Admitting: Radiation Oncology

## 2016-01-03 ENCOUNTER — Ambulatory Visit
Admission: RE | Admit: 2016-01-03 | Discharge: 2016-01-03 | Disposition: A | Discharge status: Home / Self Care | Payer: Medicare Other | Source: Ambulatory Visit | Attending: Radiation Oncology | Admitting: Radiation Oncology

## 2016-01-03 VITALS — BP 121/69 | HR 80 | Temp 98.1°F | Ht 66.0 in | Wt 262.4 lb

## 2016-01-03 DIAGNOSIS — C50511 Malignant neoplasm of lower-outer quadrant of right female breast: Secondary | ICD-10-CM

## 2016-01-03 NOTE — Progress Notes (Signed)
Radiation Oncology         [phone] ________________________________  Name: DEZARE LABELLA MRN: [id number]  Date: 01/03/2016  DOB: 09-21-1941  Follow-Up Visit Note  Outpatient  CC: Webb Silversmith, NP  Excell Seltzer, MD  Diagnosis and Prior Radiotherapy:    ICD-9-CM ICD-10-CM   1. Breast cancer of lower-outer quadrant of right female breast ([place]) 174.5 C50.511    Right Breast LOQ [id number] Stage I Grade III INVASIVE MAMMARY CARCINOMA WITH SQUAMOUS DIFFERENTIATION (METAPLASTIC CARCINOMA), Triple negative  Indication for treatment:  curative     Radiation treatment dates:   10/01/2014-11/20/2014  Site/dose:   1) Right Breast / 50.4 Gy in 28 fractions 2) Right Breast Boost / 10 Gy in 5 fractions   Narrative:  The patient returns today for routine follow-up.  Ms. Goelz presents for follow up of radiation completed 11/20/2014 to her Right Breast. She denies any concerns at this time, including pain, fatigue and skin issues. She last had a mammogram in November, and has another one planned for May to evaluate several areas. She will return for a follow up with Dr. Excell Seltzer regarding the results. She performs self breast exams on the first of each month. Patient states the mass in her right breast has been there since treatment finished. Reports the mass waxes and wanes in size, while also feeling more firm at night and softer during the day.  Her mammogram in November 2016 showed an indeterminant UOQ mass in the right breast. This was followed by an ultrasound, this showed a 4.5 cm fluid filled cavity that was felt to be consistent with hematoma or seroma. Six month follow up was recommended.     ALLERGIES:  has No Known Allergies.  Meds: Current Outpatient Prescriptions  Medication Sig Dispense Refill  . aspirin 81 MG tablet Take 81 mg by mouth daily.    Marland Kitchen atorvastatin (LIPITOR) 10 MG tablet Take 1 tablet (10 mg total) by mouth daily. 90 tablet 1  . betamethasone dipropionate  (DIPROLENE) 0.05 % cream Apply topically as needed.    . cycloSPORINE (RESTASIS) 0.05 % ophthalmic emulsion Place 1 drop into both eyes 2 (two) times daily.      Marland Kitchen esomeprazole (NEXIUM) 40 MG capsule Take 40 mg by mouth as needed.    . hydrochlorothiazide (HYDRODIURIL) 25 MG tablet Take 1 tablet (25 mg total) by mouth daily. 90 tablet 1  . Multiple Vitamins-Minerals (HAIR/SKIN/NAILS PO) Take 1 tablet by mouth daily.    Marland Kitchen olmesartan (BENICAR) 20 MG tablet Take 1 tablet by mouth  daily 90 tablet 1  . Potassium 99 MG TABS Take 1 tablet by mouth daily.       No current facility-administered medications for this encounter.    Physical Findings: The patient is in no acute distress. Patient is alert and oriented.  height is 5\' 6"  (1.676 m) and weight is 262 lb 6.4 oz (119.024 kg). Her temperature is 98.1 F (36.7 C). Her blood pressure is 121/69 and her pulse is 80. Her oxygen saturation is 97%. .     General: Alert and oriented, in no acute distress HEENT: Head is normocephalic.  Neck: Neck is supple, no palpable masses in the cervical or supraclavicular regions. Breast: No palpable masses in the left axilla or left breast. In the right UOQ of the breast there is a palpable mass of several cm in dimension and certainly could be consistent with a seroma, otherwise no palpable masses in the right axilla or rest of  right breast. Nipple inversion bilaterally.   Lab Findings: Lab Results  Component Value Date   WBC 8.0 08/27/2015   HGB 13.8 08/27/2015   HCT 41.4 08/27/2015   MCV 88.5 08/27/2015   PLT 319.0 08/27/2015    Radiographic Findings: As above  Impression/Plan:  I encouraged her to continue with  mammography and followup with medical oncology. She is scheduled for her next mammogram in May 2017 and will follow with Dr. Excell Seltzer to discuss these results. She is scheduled for follow up in medical oncology with Dr. Lindi Adie on 01/16/2016. I will see her back on a PRN basis. I have encouraged  her to call if she has any issues or concerns in the future. I wished her the very best until then.  I will defer to Dr Lindi Adie on survivorship referrals for the future.   . _____________________________________   Eppie Gibson, MD   This document serves as a record of services personally performed by Eppie Gibson, MD. It was created on her behalf by Arlyce Harman, a trained medical scribe. The creation of this record is based on the scribe's personal observations and the provider's statements to them. This document has been checked and approved by the attending provider.

## 2016-01-03 NOTE — Progress Notes (Signed)
Ms. Forte presents for follow up of radiation completed 11/20/2014 to her Right Breast. She denies any concerns at this time, including pain, fatigue and skin issues. She last had a mammogram in November, and has another one planned for May to evaluate several areas. She will return for a follow up with Dr. Excell Seltzer regarding the results.  BP 121/69 mmHg  Pulse 80  Temp(Src) 98.1 F (36.7 C)  Ht 5\' 6"  (1.676 m)  Wt 262 lb 6.4 oz (119.024 kg)  BMI 42.37 kg/m2  SpO2 97%

## 2016-01-16 ENCOUNTER — Encounter: Payer: Self-pay | Admitting: Hematology and Oncology

## 2016-01-16 ENCOUNTER — Ambulatory Visit (HOSPITAL_BASED_OUTPATIENT_CLINIC_OR_DEPARTMENT_OTHER): Payer: Medicare Other | Admitting: Hematology and Oncology

## 2016-01-16 ENCOUNTER — Telehealth: Payer: Self-pay | Admitting: Hematology and Oncology

## 2016-01-16 VITALS — BP 123/73 | HR 69 | Temp 97.4°F | Resp 20 | Ht 66.0 in | Wt 258.8 lb

## 2016-01-16 DIAGNOSIS — Z853 Personal history of malignant neoplasm of breast: Secondary | ICD-10-CM | POA: Diagnosis not present

## 2016-01-16 DIAGNOSIS — C50511 Malignant neoplasm of lower-outer quadrant of right female breast: Secondary | ICD-10-CM

## 2016-01-16 NOTE — Progress Notes (Signed)
Patient Care Team: Jearld Fenton, NP as PCP - General (Internal Medicine) Nicholas Lose, MD as Consulting Physician (Hematology and Oncology) Excell Seltzer, MD as Consulting Physician (General Surgery) Eppie Gibson, MD as Attending Physician (Radiation Oncology) Holley Bouche, NP as Nurse Practitioner (Nurse Practitioner)  DIAGNOSIS: Breast cancer of lower-outer quadrant of right female breast [facility])   Staging form: Breast, AJCC 7th Edition     Clinical: Stage IA (T1c, N0, M0) - Unsigned       Staging comments: Staging performed at breast conference on 11.11.15      Pathologic stage from 08/29/2014: Stage IA (T1c, N0, cM0) - Unsigned   SUMMARY OF ONCOLOGIC HISTORY:   Breast cancer of lower-outer quadrant of right female breast ([place])   08/14/2014 Initial Diagnosis Right breast high-grade invasive carcinoma spindle, epithelioid and squamous differentiation, ER 2%, PR 0%, HER-2 negative ratio 1.57, Ki-67 70%   08/21/2014 Breast MRI Right breast enhancement at the site of the biopsy 1.2 x 1.2 x 1.4 cm enhancing mass, along lateral margin 1.8 cm hematoma   08/29/2014 Surgery Right breast lumpectomy: 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  Right breast boost: Total of 10 Gy in 5 fractions.    10/18/2014 Procedure Genetic testing found a BRCA2 VUS called [id number], but was otherwise normal    CHIEF COMPLIANT: follow-up of triple negative breast cancer  INTERVAL HISTORY: Miranda Mueller is a 75 year old with above-mentioned history of right breast triple negative cancer who is currently on surveillance. She underwent lumpectomy followed by radiation 2015-2016. She had a mammogram in November 2016 which was followed up by an ultrasound. Ultrasound revealed  a 4.2 cm area of seroma. A repeat ultrasound and mammogram are being planned for April 2017. She denies any new problems or concerns.  REVIEW OF SYSTEMS:   Constitutional: Denies fevers, chills or abnormal weight loss Eyes: Denies blurriness of vision Ears, nose, mouth, throat, and face: Denies mucositis or sore throat Respiratory: Denies cough, dyspnea or wheezes Cardiovascular: Denies palpitation, chest discomfort Gastrointestinal:  Denies nausea, heartburn or change in bowel habits Skin: Denies abnormal skin rashes Lymphatics: Denies new lymphadenopathy or easy bruising Neurological:Denies numbness, tingling or new weaknesses Behavioral/Psych: Mood is stable, no new changes  Extremities: No lower extremity edema Breast:  denies any pain or lumps or nodules in either breasts All other systems were reviewed with the patient and are negative.  I have reviewed the past medical history, past surgical history, social history and family history with the patient and they are unchanged from previous note.  ALLERGIES:  has No Known Allergies.  MEDICATIONS:  Current Outpatient Prescriptions  Medication Sig Dispense Refill  . aspirin 81 MG tablet Take 81 mg by mouth daily.    Marland Kitchen atorvastatin (LIPITOR) 10 MG tablet Take 1 tablet (10 mg total) by mouth daily. 90 tablet 1  . betamethasone dipropionate (DIPROLENE) 0.05 % cream Apply topically as needed.    . cycloSPORINE (RESTASIS) 0.05 % ophthalmic emulsion Place 1 drop into both eyes 2 (two) times daily.      Marland Kitchen esomeprazole (NEXIUM) 40 MG capsule Take 40 mg by mouth as needed.    . hydrochlorothiazide (HYDRODIURIL) 25 MG tablet Take 1 tablet (25 mg total) by mouth daily. 90 tablet 1  . Multiple Vitamins-Minerals (  HAIR/SKIN/NAILS PO) Take 1 tablet by mouth daily.    Marland Kitchen olmesartan (BENICAR) 20 MG tablet Take 1 tablet by mouth  daily 90 tablet 1  . Potassium 99 MG TABS Take 1 tablet by mouth daily.       No current facility-administered medications  for this visit.    PHYSICAL EXAMINATION: ECOG PERFORMANCE STATUS: 0 - Asymptomatic  There were no vitals filed for this visit. There were no vitals filed for this visit.  GENERAL:alert, no distress and comfortable SKIN: skin color, texture, turgor are normal, no rashes or significant lesions EYES: normal, Conjunctiva are pink and non-injected, sclera clear OROPHARYNX:no exudate, no erythema and lips, buccal mucosa, and tongue normal  NECK: supple, thyroid normal size, non-tender, without nodularity LYMPH:  no palpable lymphadenopathy in the cervical, axillary or inguinal LUNGS: clear to auscultation and percussion with normal breathing effort HEART: regular rate & rhythm and no murmurs and no lower extremity edema ABDOMEN:abdomen soft, non-tender and normal bowel sounds MUSCULOSKELETAL:no cyanosis of digits and no clubbing  NEURO: alert & oriented x 3 with fluent speech, no focal motor/sensory deficits EXTREMITIES: No lower extremity edema BREAST: right breast palpable area of abnormality in the 9:00 position which is the seroma. No palpable axillary supraclavicular or infraclavicular adenopathy no breast tenderness or nipple discharge. (exam performed in the presence of a chaperone)  LABORATORY DATA:  I have reviewed the data as listed   Chemistry      Component Value Date/Time   NA 138 08/27/2015 1601   NA 138 08/22/2014 1237   K 3.7 08/27/2015 1601   K 3.8 08/22/2014 1237   CL 100 08/27/2015 1601   CO2 29 08/27/2015 1601   CO2 28 08/22/2014 1237   BUN 18 08/27/2015 1601   BUN 16.0 08/22/2014 1237   CREATININE 1.10 08/27/2015 1601   CREATININE 1.1 08/22/2014 1237      Component Value Date/Time   CALCIUM 9.5 08/27/2015 1601   CALCIUM 9.8 08/22/2014 1237   ALKPHOS 41 08/27/2015 1601   ALKPHOS 50 08/22/2014 1237   AST 18 08/27/2015 1601   AST 17 08/22/2014 1237   ALT 18 08/27/2015 1601   ALT 22 08/22/2014 1237   BILITOT 0.4 08/27/2015 1601   BILITOT 0.42 08/22/2014  1237       Lab Results  Component Value Date   WBC 8.0 08/27/2015   HGB 13.8 08/27/2015   HCT 41.4 08/27/2015   MCV 88.5 08/27/2015   PLT 319.0 08/27/2015   NEUTROABS 5.5 08/22/2014   ASSESSMENT & PLAN:  Breast cancer of lower-outer quadrant of right female breast 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  Breast cancer surveillance: 1. Breast exam 01/16/2016 does not reveal any areas of concern for recurrent breast cancer. 2. Mamm[facility] 08/19/2015 at [facility]: Oval mass in the right breast, grouped dystrophic vascular calcifications in the right breast central to the nipple anterior depth. Ultrasound revealed a 4.5 cm oval fluid collection which is a seroma and is probably benign. Six-month follow-up was recommended.  Survivorship: Discussed the importance of physical exercise in decreasing the likelihood of breast cancer recurrence. Recommended 30 mins daily 6 days a week of either brisk walking or cycling or swimming. Encouraged patient to eat more fruits and vegetables and decrease red meat.   Return to  clinic in 1 year for continued surveillance.    No orders of the defined types were placed in this encounter.   The patient has a good understanding of the overall plan. she agrees with it. she will call with any problems that may develop before the next visit here.   Rulon Eisenmenger, MD 01/16/2016

## 2016-01-16 NOTE — Progress Notes (Signed)
Unable to get in to exam room prior to MD.  No assessment performed.  

## 2016-01-16 NOTE — Telephone Encounter (Signed)
appt made and avs printed °

## 2016-01-16 NOTE — Assessment & Plan Note (Addendum)
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  Breast cancer surveillance: 1. Breast exam 01/16/2016 does not reveal any areas of concern for recurrent breast cancer. 2. Mammogram 08/19/2015 at [facility]: Oval mass in the right breast, grouped dystrophic vascular calcifications in the right breast central to the nipple anterior depth. Ultrasound revealed a 4.5 cm oval fluid collection which is a seroma and is probably benign. Six-month follow-up was recommended.  Return to clinic in 1 year for continued surveillance.

## 2016-02-18 ENCOUNTER — Encounter: Payer: Self-pay | Admitting: Internal Medicine

## 2016-02-18 DIAGNOSIS — Z780 Asymptomatic menopausal state: Secondary | ICD-10-CM | POA: Diagnosis not present

## 2016-02-18 DIAGNOSIS — R921 Mammographic calcification found on diagnostic imaging of breast: Secondary | ICD-10-CM | POA: Diagnosis not present

## 2016-02-19 ENCOUNTER — Encounter: Payer: Self-pay | Admitting: Internal Medicine

## 2016-02-25 ENCOUNTER — Encounter: Payer: Self-pay | Admitting: Adult Health

## 2016-02-25 NOTE — Progress Notes (Signed)
A birthday card was mailed to the patient today on behalf of the Survivorship Program at [facility].   Katreena Schupp, NP Survivorship Program [facility] [phone]  

## 2016-03-19 ENCOUNTER — Encounter: Payer: Self-pay | Admitting: Internal Medicine

## 2016-03-19 ENCOUNTER — Ambulatory Visit (INDEPENDENT_AMBULATORY_CARE_PROVIDER_SITE_OTHER): Payer: Medicare Other | Admitting: Internal Medicine

## 2016-03-19 DIAGNOSIS — C50511 Malignant neoplasm of lower-outer quadrant of right female breast: Secondary | ICD-10-CM

## 2016-03-19 DIAGNOSIS — J309 Allergic rhinitis, unspecified: Secondary | ICD-10-CM

## 2016-03-19 DIAGNOSIS — G4733 Obstructive sleep apnea (adult) (pediatric): Secondary | ICD-10-CM

## 2016-03-19 DIAGNOSIS — K219 Gastro-esophageal reflux disease without esophagitis: Secondary | ICD-10-CM

## 2016-03-19 DIAGNOSIS — I10 Essential (primary) hypertension: Secondary | ICD-10-CM

## 2016-03-19 DIAGNOSIS — E785 Hyperlipidemia, unspecified: Secondary | ICD-10-CM | POA: Diagnosis not present

## 2016-03-19 LAB — COMPREHENSIVE METABOLIC PANEL
ALBUMIN: 4.2 g/dL (ref 3.5–5.2)
ALT: 23 U/L (ref 0–35)
AST: 18 U/L (ref 0–37)
Alkaline Phosphatase: 51 U/L (ref 39–117)
BUN: 16 mg/dL (ref 6–23)
CALCIUM: 9.6 mg/dL (ref 8.4–10.5)
CHLORIDE: 100 meq/L (ref 96–112)
CO2: 31 mEq/L (ref 19–32)
CREATININE: 0.93 mg/dL (ref 0.40–1.20)
GFR: 62.63 mL/min (ref >60.00)
Glucose, Bld: 102 mg/dL — ABNORMAL HIGH (ref 70–99)
POTASSIUM: 4 meq/L (ref 3.5–5.1)
Sodium: 138 mEq/L (ref 135–145)
Total Bilirubin: 0.6 mg/dL (ref 0.2–1.2)
Total Protein: 7.5 g/dL (ref 6.0–8.3)

## 2016-03-19 LAB — LIPID PANEL
CHOLESTEROL: 153 mg/dL (ref 0–200)
HDL: 43.5 mg/dL (ref >39.00)
LDL CALC: 84 mg/dL (ref 0–99)
NonHDL: 109.64
TRIGLYCERIDES: 128 mg/dL (ref 0.0–149.0)
Total CHOL/HDL Ratio: 4
VLDL: 25.6 mg/dL (ref 0.0–40.0)

## 2016-03-19 MED ORDER — HYDROCHLOROTHIAZIDE 25 MG PO TABS: 25.0000 mg | ORAL_TABLET | Freq: Every day | ORAL | Status: DC

## 2016-03-19 MED ORDER — FLUTICASONE PROPIONATE 50 MCG/ACT NA SUSP: 2.0000 | Freq: Every day | NASAL | Status: DC

## 2016-03-19 MED ORDER — ATORVASTATIN CALCIUM 10 MG PO TABS: 10.0000 mg | ORAL_TABLET | Freq: Every day | ORAL | Status: DC

## 2016-03-19 MED ORDER — OLMESARTAN MEDOXOMIL 20 MG PO TABS: ORAL_TABLET | ORAL | Status: DC

## 2016-03-19 NOTE — Assessment & Plan Note (Signed)
Will check CMET and Lipid Profile Lipitor refilled x 6 months Handout given on low fat diet Continue baby ASA

## 2016-03-19 NOTE — Assessment & Plan Note (Signed)
Discussed avoiding trigger foods Discussed how weight loss could help improve her reflux Continue Nexium and Rolaids prn

## 2016-03-19 NOTE — Assessment & Plan Note (Signed)
Controlled on Benicar and HCT CME today Continue Potassium supplement Benicar and HCT refilled x 6 months

## 2016-03-19 NOTE — Assessment & Plan Note (Signed)
Will follow mammograms She will continue to follow with surgical oncology, oncology and radiation

## 2016-03-19 NOTE — Addendum Note (Signed)
Addended by: Marchia Bond on: 03/19/2016 09:48 AM   Modules accepted: Miquel Dunn

## 2016-03-19 NOTE — Progress Notes (Signed)
Pre visit review using our clinic review tool, if applicable. No additional management support is needed unless otherwise documented below in the visit note. 

## 2016-03-19 NOTE — Progress Notes (Signed)
HPI  Pt presents to the clinic today for follow up of chronic conditions.  GERD: Her reflux seems worse when she eat late at night. It is also triggerd by caffeine. She does not take her Nexium daily, Only takes as needed. She takes Rolaids as needed.   HLD: Her last LDL was 124. She is prescribed Lipitor as prescribed. She denies myalgias. She is taking a baby ASA daily. She does try to consume a low fat diet.  HTN: She takes Benicar and HCTZ. She does take a potassium supplement daily. ECG from 08/2014 reviewed. She denies chest pain or shortness of breath. Her BP today is 130/82.  Breast cancer, right: She follows with surgical oncology, oncology, and radiology. She had a mammogram 02/2016- results reviewed in [place].  OSA: She does not use CPAP.   She also c/o left sided facial pain and pressure. This started last night. She is blowing clear mucous out of her nose. She denies sore throat. She has had a slight cough but denies shortness of breath. She denies fever, chills or body aches. 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 She has a history of seasonal allergies. She has had sick contacts.  Past Medical History  Diagnosis Date  . GERD (gastroesophageal reflux disease)   . IBS (irritable bowel syndrome)   . PVC (premature ventricular contraction)     Hx of  . History of shingles   . Hyperlipidemia   . Hypertension   . Bradycardia   . Multifocal PVCs   . Hx of radiation therapy 10/01/14- 11/20/14    right breast/50.4 Gy/28 fx; right breast boost/10 Gy/5 fx  . Breast cancer ([place]) 08/29/14    Right Breast -High Grade Invasive Mammary  . Cataract     surgery  . Sleep apnea     no cpap per pt     Current Outpatient Prescriptions  Medication Sig Dispense Refill  . aspirin 81 MG tablet Take 81 mg by mouth daily.    Marland Kitchen atorvastatin (LIPITOR) 10 MG tablet Take 1 tablet (10 mg total) by mouth daily. 90 tablet 1  . betamethasone dipropionate (DIPROLENE) 0.05 % cream Apply topically as  needed.    . cycloSPORINE (RESTASIS) 0.05 % ophthalmic emulsion Place 1 drop into both eyes 2 (two) times daily.      Marland Kitchen esomeprazole (NEXIUM) 40 MG capsule Take 40 mg by mouth as needed.    . hydrochlorothiazide (HYDRODIURIL) 25 MG tablet Take 1 tablet (25 mg total) by mouth daily. 90 tablet 1  . Multiple Vitamins-Minerals (HAIR/SKIN/NAILS PO) Take 1 tablet by mouth daily.    Marland Kitchen olmesartan (BENICAR) 20 MG tablet Take 1 tablet by mouth  daily 90 tablet 1  . Potassium 99 MG TABS Take 1 tablet by mouth daily.       No current facility-administered medications for this visit.    No Known Allergies  Family History  Problem Relation Age of Onset  . Hypertension Mother   . Heart disease Mother     CAD  . Breast cancer Mother 30  . Hypertension Sister   . Breast cancer Sister 30  . Aneurysm Sister   . Hypertension Brother   . Prostate cancer Brother 13  . Leukemia Maternal Uncle 86  . Heart disease Maternal Grandmother   . Leukemia Maternal Uncle 36  . Breast cancer Cousin     dx under 69  . Colon cancer Cousin     dx in her 14s  . Colon cancer  Cousin   . Lymphoma Other 49    NHL  . Colon polyps Daughter   . Stroke Father     Social History   Social History  . Marital Status: Married    Spouse Name: Homer  . Number of Children: 1  . Years of Education: 12   Occupational History  .      retired   Social History Main Topics  . Smoking status: Never Smoker   . Smokeless tobacco: Never Used  . Alcohol Use: No  . Drug Use: No  . Sexual Activity: No   Other Topics Concern  . Not on file   Social History Narrative   Consumes one glass of caffeine daily    ROS:  Constitutional: Denies fever, malaise, fatigue, headache or abrupt weight changes.  HEENT: Pt reports left side facial pain and pressure, runny nose. Denies eye pain, eye redness, ear pain, ringing in the ears, wax buildup, nasal congestion, bloody nose, or sore throat. Respiratory: Pt reports cough .Denies  difficulty breathing, shortness of breath, or sputum production.   Cardiovascular: Denies chest pain, chest tightness, palpitations or swelling in the hands or feet.  Gastrointestinal: Denies abdominal pain, bloating, constipation, diarrhea or blood in the stool.  Musculoskeletal:  Denies decrease in range of motion, difficulty with gait, muscle pain or joint swelling.  Skin: Denies redness, rashes, lesions or ulcercations.  Neurological: Denies dizziness, difficulty with memory, difficulty with speech or problems with balance and coordination.  Psych: Denies anxiety, depression, SI/HI.  No other specific complaints in a complete review of systems (except as listed in HPI above).  PE:  BP 130/82 mmHg  Pulse 73  Temp(Src) 97.8 F (36.6 C) (Oral)  Wt 259 lb (117.482 kg)  SpO2 95%   Wt Readings from Last 3 Encounters:  01/16/16 258 lb 12.8 oz (117.391 kg)  01/03/16 262 lb 6.4 oz (119.024 kg)  09/19/15 256 lb (116.121 kg)    General: Appears her stated age, obese in NAD. HEENT: Left maxillary sinus tenderness noted. Nose: mucosa boggy, L>R. Left Ear: TM pink but intact, + serous effusion noted. Throat: pink and moist, + PND. Neck: No adenopathy noted. Cardiovascular: Normal rate and rhythm. Distant S1,S2 noted.  No murmur, rubs or gallops noted. No carotid bruits noted. Pulmonary/Chest: Normal effort and positive vesicular breath sounds. No respiratory distress. No wheezes, rales or ronchi noted.  Abdomen: Soft and nontender. Normal bowel sounds. Neurological: Alert and oriented.  Psychiatric: Mood and affect normal. Behavior is normal. Judgment and thought content normal.    BMET    Component Value Date/Time   NA 138 08/27/2015 1601   NA 138 08/22/2014 1237   K 3.7 08/27/2015 1601   K 3.8 08/22/2014 1237   CL 100 08/27/2015 1601   CO2 29 08/27/2015 1601   CO2 28 08/22/2014 1237   GLUCOSE 89 08/27/2015 1601   GLUCOSE 102 08/22/2014 1237   BUN 18 08/27/2015 1601   BUN 16.0  08/22/2014 1237   CREATININE 1.10 08/27/2015 1601   CREATININE 1.1 08/22/2014 1237   CALCIUM 9.5 08/27/2015 1601   CALCIUM 9.8 08/22/2014 1237   GFRNONAA 53.06 02/17/2010 1454   GFRAA 72 12/01/2007 1105    Lipid Panel     Component Value Date/Time   CHOL 202* 08/27/2015 1601   TRIG 194.0* 08/27/2015 1601   HDL 38.60* 08/27/2015 1601   CHOLHDL 5 08/27/2015 1601   VLDL 38.8 08/27/2015 1601   LDLCALC 124* 08/27/2015 1601    CBC  Component Value Date/Time   WBC 8.0 08/27/2015 1601   WBC 9.7 08/22/2014 1237   RBC 4.68 08/27/2015 1601   RBC 4.82 08/22/2014 1237   HGB 13.8 08/27/2015 1601   HGB 13.9 08/22/2014 1237   HCT 41.4 08/27/2015 1601   HCT 42.7 08/22/2014 1237   PLT 319.0 08/27/2015 1601   PLT 345 08/22/2014 1237   MCV 88.5 08/27/2015 1601   MCV 88.7 08/22/2014 1237   MCH 29.0 08/22/2014 1237   MCHC 33.3 08/27/2015 1601   MCHC 32.7 08/22/2014 1237   RDW 13.4 08/27/2015 1601   RDW 13.8 08/22/2014 1237   LYMPHSABS 2.9 08/22/2014 1237   LYMPHSABS 2.5 08/24/2013 0834   MONOABS 0.8 08/22/2014 1237   MONOABS 0.5 08/24/2013 0834   EOSABS 0.4 08/22/2014 1237   EOSABS 0.4 08/24/2013 0834   BASOSABS 0.1 08/22/2014 1237   BASOSABS 0.0 08/24/2013 0834    Hgb A1C Lab Results  Component Value Date   HGBA1C 5.5 08/27/2015     Assessment and Plan:  Allergic Rhinitis:  Continue Aleve at night x 1 week eRx for Flonase 1 spray left nostril daily in am x 1 week Return precautions given  She has an appt for her medicare wellness exam

## 2016-03-19 NOTE — Patient Instructions (Signed)

## 2016-03-19 NOTE — Assessment & Plan Note (Signed)
She does not use CPAP Discussed how weight loss could help improve her OSA

## 2016-04-21 DIAGNOSIS — C50911 Malignant neoplasm of unspecified site of right female breast: Secondary | ICD-10-CM | POA: Diagnosis not present

## 2016-08-03 DIAGNOSIS — H04123 Dry eye syndrome of bilateral lacrimal glands: Secondary | ICD-10-CM | POA: Diagnosis not present

## 2016-08-03 DIAGNOSIS — H524 Presbyopia: Secondary | ICD-10-CM | POA: Diagnosis not present

## 2016-08-04 ENCOUNTER — Ambulatory Visit (INDEPENDENT_AMBULATORY_CARE_PROVIDER_SITE_OTHER): Payer: Medicare Other

## 2016-08-04 DIAGNOSIS — Z23 Encounter for immunization: Secondary | ICD-10-CM | POA: Diagnosis not present

## 2016-08-18 DIAGNOSIS — R921 Mammographic calcification found on diagnostic imaging of breast: Secondary | ICD-10-CM | POA: Diagnosis not present

## 2016-10-06 ENCOUNTER — Other Ambulatory Visit: Payer: Self-pay | Admitting: *Deleted

## 2016-10-06 DIAGNOSIS — I10 Essential (primary) hypertension: Secondary | ICD-10-CM

## 2016-10-06 MED ORDER — HYDROCHLOROTHIAZIDE 25 MG PO TABS: 25.0000 mg | ORAL_TABLET | Freq: Every day | ORAL | 1 refills | Status: DC

## 2016-10-06 MED ORDER — ATORVASTATIN CALCIUM 10 MG PO TABS: 10.0000 mg | ORAL_TABLET | Freq: Every day | ORAL | 1 refills | Status: DC

## 2016-10-08 ENCOUNTER — Other Ambulatory Visit: Payer: Self-pay

## 2016-10-08 DIAGNOSIS — I10 Essential (primary) hypertension: Secondary | ICD-10-CM

## 2016-10-08 MED ORDER — OLMESARTAN MEDOXOMIL 20 MG PO TABS: ORAL_TABLET | ORAL | 0 refills | Status: DC

## 2016-10-21 IMAGING — MR MR BREAST BILATERAL W WO CONTRAST
9 of 13 series · 32 of 48 positions shown · IV contrast (multihance)
Comparison: Previous exams

CLINICAL DATA: Patient with recent diagnosis of right breast
high-grade invasive carcinoma.

LABS:  BUN and creatinine were obtained on site at [HOSPITAL]
[HOSPITAL].
Results:  BUN 14 mg/dL,  Creatinine 1.0 mg/dL.  GFR = 56
EXAM:
BILATERAL BREAST MRI WITH AND WITHOUT CONTRAST
TECHNIQUE: Multiplanar, multisequence MR images of both breasts were obtained
prior to and following the intravenous administration of ml of
MultiHance.

[Series 4: T2 · axial · 3.0mm · 1.04mm/px · z∈[-36,+132]mm · 3 of 57 slices shown]
[im 1/57]
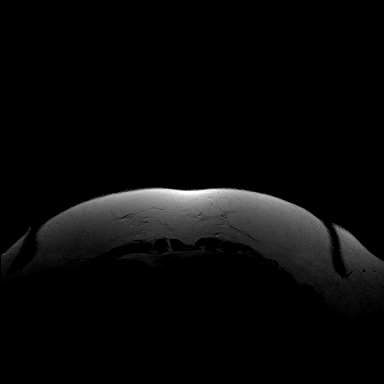
[im 29/57]
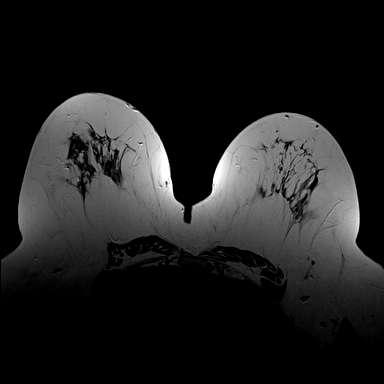
[im 57/57]
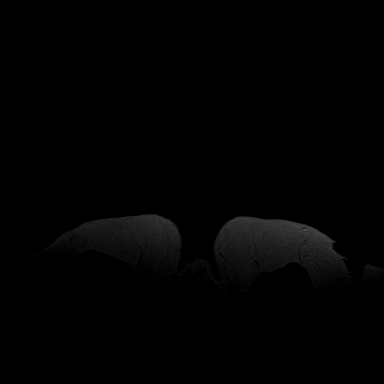

[Series 5: t2_tirm_tra ipat (a-p) · axial · 3.0mm · 0.78mm/px · z∈[-36,+132]mm · 2 of 57 slices shown]
[im 1/57]
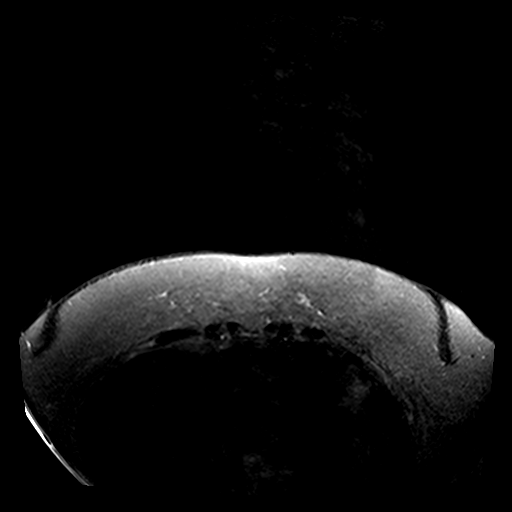
[im 57/57]
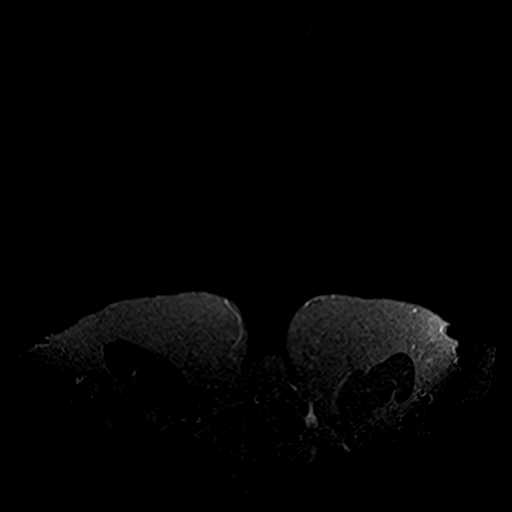

[Series 6: fl3d pre-cm no · axial · non-contrast · 1.2mm · 1.04mm/px · z∈[-38,+133]mm · 5 of 144 slices shown]
[im 1/144]
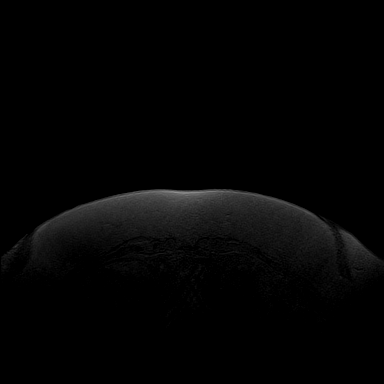
[im 36/144]
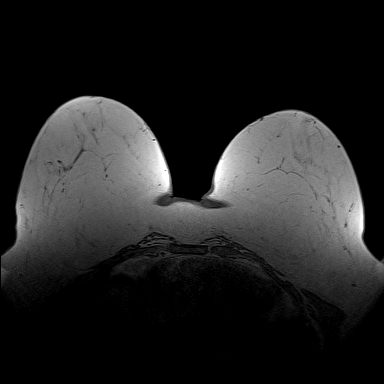
[im 72/144]
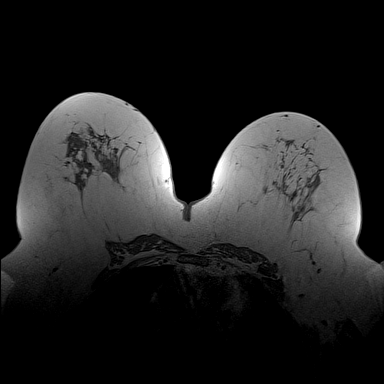
[im 108/144]
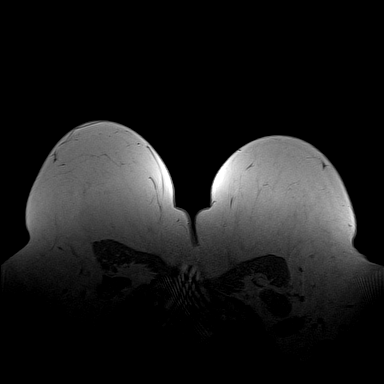
[im 144/144]
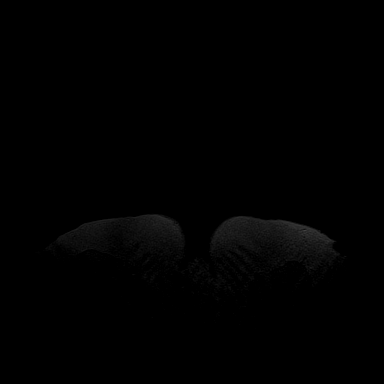

[Series 7: fl3d pre-cm · axial · non-contrast · 1.2mm · 1.04mm/px · z∈[-38,+133]mm · 5 of 144 slices shown]
[im 1/144]
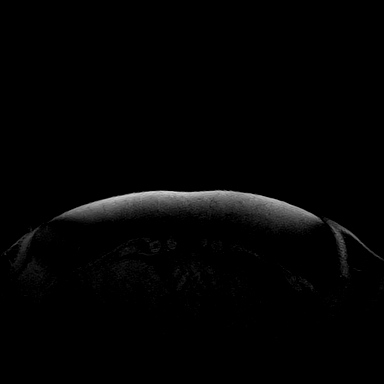
[im 36/144]
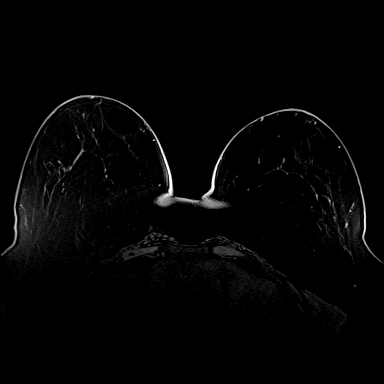
[im 72/144]
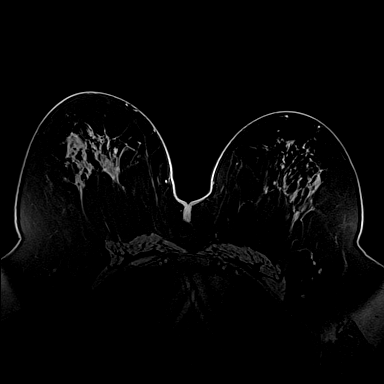
[im 108/144]
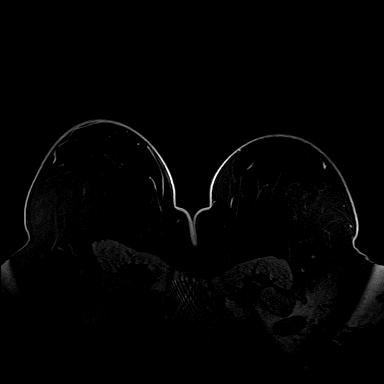
[im 144/144]
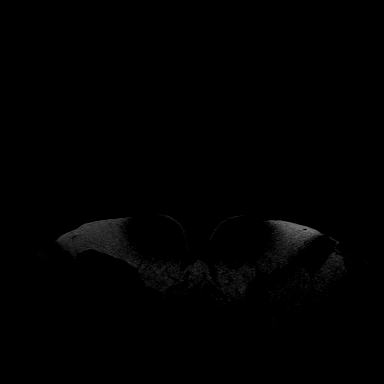

[Series 8: fl3d post-cm 20 · axial · 1.2mm · 1.04mm/px · z∈[-38,+133]mm · 5 of 144 slices shown (1 of 3)]
[im 1/144]
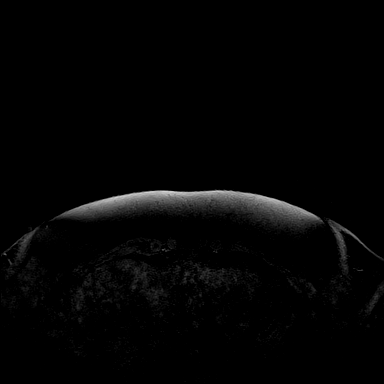
[im 36/144]
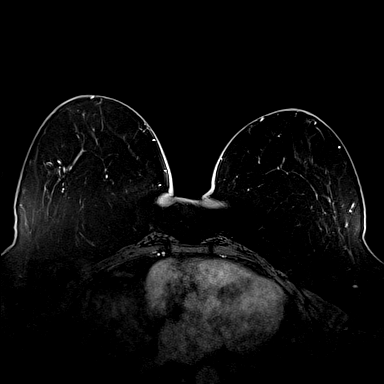
[im 72/144]
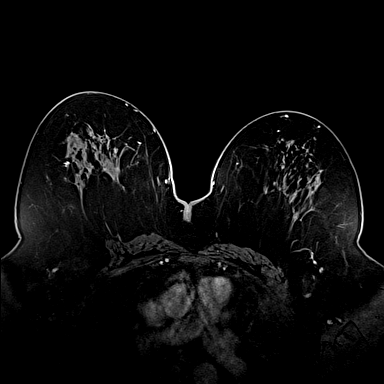
[im 108/144]
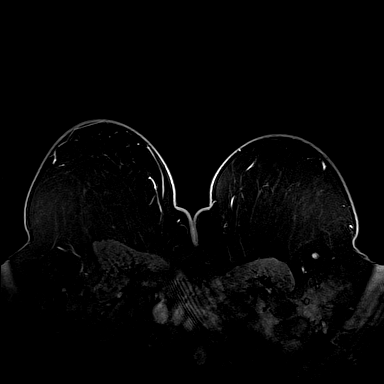
[im 144/144]
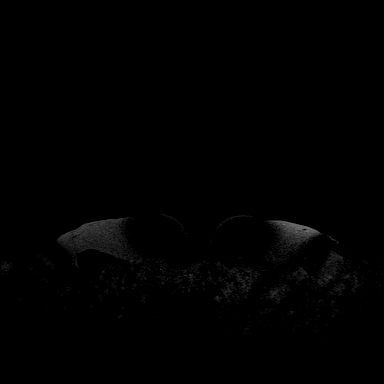

[Series 9: fl3d post-cm 20 · axial · 1.2mm · 1.04mm/px · z∈[-38,+133]mm · 5 of 144 slices shown (2 of 3)]
[im 1/144]
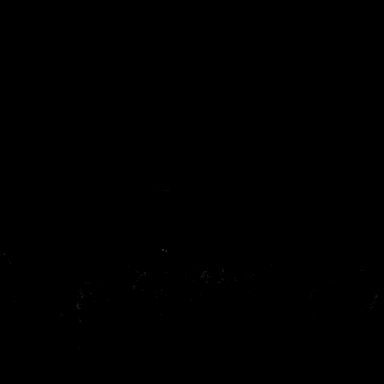
[im 36/144]
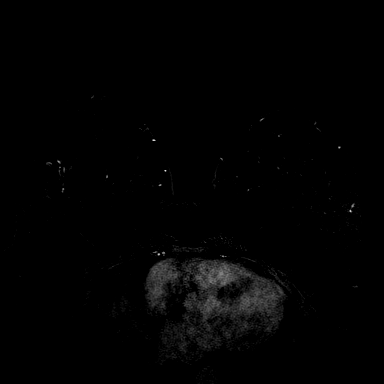
[im 72/144]
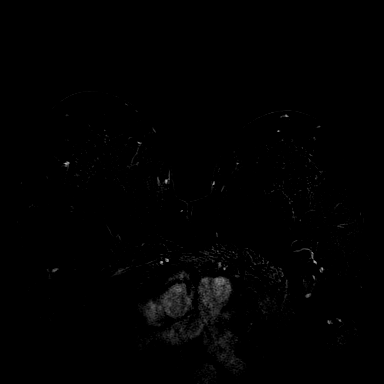
[im 108/144]
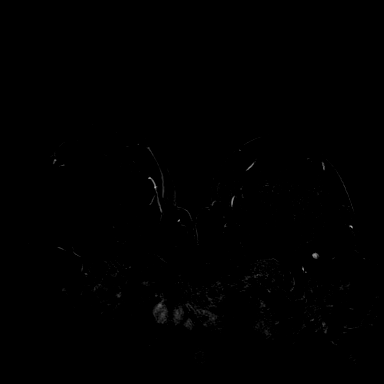
[im 144/144]
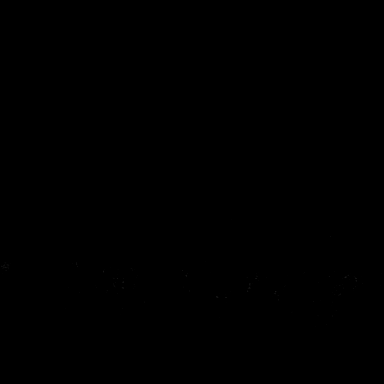

[Series 10: fl3d post-cm 20 · axial · 172.8mm · 1.04mm/px · 1 of 1 slices shown (3 of 3)]
[im 1/1]
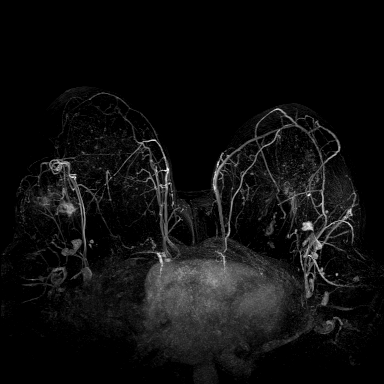

[Series 11: fl3d post-cm 3min · axial · 1.2mm · 1.04mm/px · z∈[-38,+133]mm · 5 of 144 slices shown]
[im 1/144]
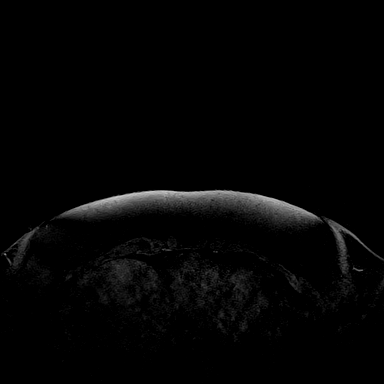
[im 36/144]
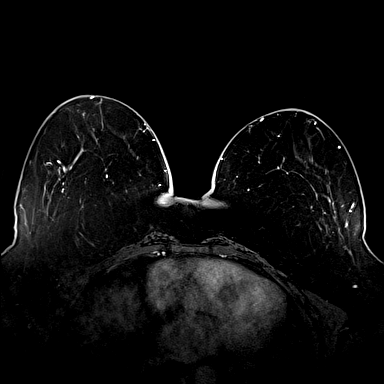
[im 72/144]
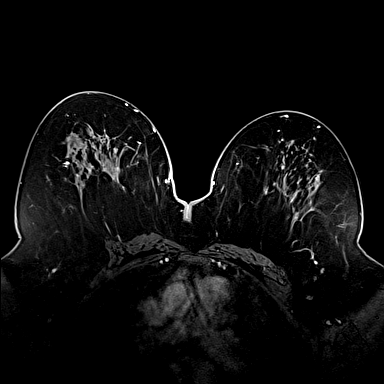
[im 108/144]
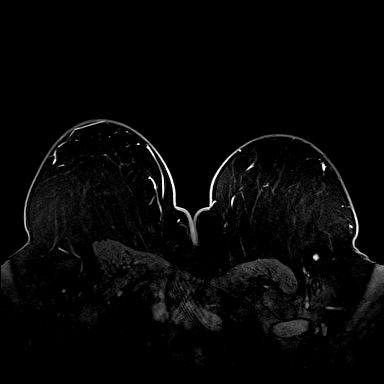
[im 144/144]
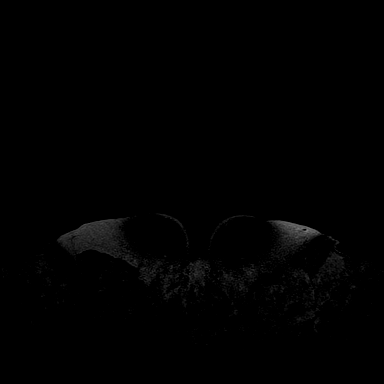

[Series 12: fl3d post-cm 3min_sub · axial · 1.2mm · 1.04mm/px · 1 of 144 slices shown]
[im 1/144]
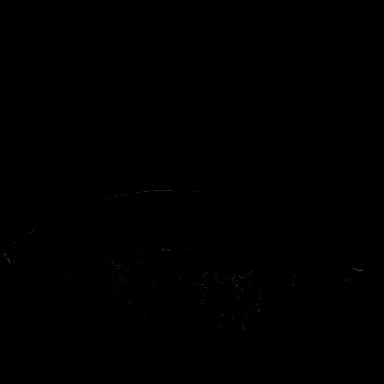

[32 of 48 positions shown; findings below may reference images not displayed]

THREE-DIMENSIONAL MR IMAGE RENDERING ON INDEPENDENT WORKSTATION:

Three-dimensional MR images were rendered by post-processing of the
original MR data on an independent workstation. The
three-dimensional MR images were interpreted, and findings are
reported in the following complete MRI report for this study. Three
dimensional images were evaluated at the independent [employer]
workstation
FINDINGS: Breast composition: b.  Scattered fibroglandular tissue.

Background parenchymal enhancement: Mild

Right breast: Within the lower outer quadrant of the right breast
middle to posterior depth there is a 1.2 x 1.2 x 1.4 cm enhancing
mass containing central susceptibility artifact, compatible with
recently biopsied right breast carcinoma. No additional areas of
concerning enhancement are identified within the right breast. Along
the lateral margin of the mass is a 1.8 x 1.8 cm peripherally
enhancing intrinsically T1 bright hematoma.

Left breast: No mass or abnormal enhancement.

Lymph nodes: No abnormal appearing lymph nodes.

Ancillary findings:  None.
IMPRESSION: Enhancing right breast mass compatible with recently biopsy right
breast carcinoma.

Along the lateral margin of the mass is a 1.8 cm peripherally
enhancing hematoma.

No additional areas of concerning enhancement are identified within
either breast.

RECOMMENDATION:
Treatment plan.

BI-RADS CATEGORY  6: Known biopsy-proven malignancy.

## 2016-11-19 ENCOUNTER — Ambulatory Visit (INDEPENDENT_AMBULATORY_CARE_PROVIDER_SITE_OTHER): Payer: Medicare Other | Admitting: Internal Medicine

## 2016-11-19 ENCOUNTER — Ambulatory Visit (INDEPENDENT_AMBULATORY_CARE_PROVIDER_SITE_OTHER)
Admission: RE | Admit: 2016-11-19 | Discharge: 2016-11-19 | Disposition: A | Discharge status: Home / Self Care | Payer: Medicare Other | Source: Ambulatory Visit | Attending: Internal Medicine | Admitting: Internal Medicine

## 2016-11-19 ENCOUNTER — Encounter: Payer: Self-pay | Admitting: Internal Medicine

## 2016-11-19 VITALS — BP 128/82 | HR 76 | Temp 97.5°F | Ht 66.0 in | Wt 259.0 lb

## 2016-11-19 DIAGNOSIS — M545 Low back pain, unspecified: Secondary | ICD-10-CM

## 2016-11-19 DIAGNOSIS — I10 Essential (primary) hypertension: Secondary | ICD-10-CM | POA: Diagnosis not present

## 2016-11-19 DIAGNOSIS — Z0000 Encounter for general adult medical examination without abnormal findings: Secondary | ICD-10-CM | POA: Diagnosis not present

## 2016-11-19 DIAGNOSIS — G8929 Other chronic pain: Secondary | ICD-10-CM | POA: Diagnosis not present

## 2016-11-19 DIAGNOSIS — E7800 Pure hypercholesterolemia, unspecified: Secondary | ICD-10-CM

## 2016-11-19 DIAGNOSIS — K219 Gastro-esophageal reflux disease without esophagitis: Secondary | ICD-10-CM

## 2016-11-19 DIAGNOSIS — G4733 Obstructive sleep apnea (adult) (pediatric): Secondary | ICD-10-CM | POA: Diagnosis not present

## 2016-11-19 DIAGNOSIS — C50511 Malignant neoplasm of lower-outer quadrant of right female breast: Secondary | ICD-10-CM

## 2016-11-19 LAB — COMPREHENSIVE METABOLIC PANEL
ALT: 16 U/L (ref 0–35)
AST: 14 U/L (ref 0–37)
Albumin: 4.3 g/dL (ref 3.5–5.2)
Alkaline Phosphatase: 47 U/L (ref 39–117)
BILIRUBIN TOTAL: 0.7 mg/dL (ref 0.2–1.2)
BUN: 22 mg/dL (ref 6–23)
CO2: 32 meq/L (ref 19–32)
Calcium: 9.4 mg/dL (ref 8.4–10.5)
Chloride: 102 mEq/L (ref 96–112)
Creatinine, Ser: 1 mg/dL (ref 0.40–1.20)
GFR: 57.49 mL/min — AB (ref >60.00)
GLUCOSE: 106 mg/dL — AB (ref 70–99)
Potassium: 3.9 mEq/L (ref 3.5–5.1)
Sodium: 139 mEq/L (ref 135–145)
Total Protein: 7.6 g/dL (ref 6.0–8.3)

## 2016-11-19 LAB — LIPID PANEL
CHOL/HDL RATIO: 3
Cholesterol: 147 mg/dL (ref 0–200)
HDL: 43 mg/dL (ref >39.00)
LDL Cholesterol: 84 mg/dL (ref 0–99)
NONHDL: 104.46
Triglycerides: 103 mg/dL (ref 0.0–149.0)
VLDL: 20.6 mg/dL (ref 0.0–40.0)

## 2016-11-19 LAB — CBC
HCT: 40.2 % (ref 36.0–46.0)
HEMOGLOBIN: 13.5 g/dL (ref 12.0–15.0)
MCHC: 33.7 g/dL (ref 30.0–36.0)
MCV: 87.6 fl (ref 78.0–100.0)
Platelets: 325 10*3/uL (ref 150.0–400.0)
RBC: 4.59 Mil/uL (ref 3.87–5.11)
RDW: 13.7 % (ref 11.5–15.5)
WBC: 6.3 10*3/uL (ref 4.0–10.5)

## 2016-11-19 NOTE — Patient Instructions (Signed)

## 2016-11-19 NOTE — Assessment & Plan Note (Signed)
CMET and Lipid Profile today Continue Lipito and ASA Encouraged her to consume a low fat diet.

## 2016-11-19 NOTE — Assessment & Plan Note (Signed)
So far no reoccurrence She continues to follow with oncoogy

## 2016-11-19 NOTE — Progress Notes (Signed)
HPI  Pt presents to the clinic today for her Medicare Wellness Exam. She is also due to followup chronic conditions.  History of Breast Cancer: s/p lumpectomy and radiation. She continues to follow with medical, surgical and radiation oncology. Mammogram from 08/2016 reviewed.  GERD: Triggered by eating late at night or consuming caffeine. She takes Nexium and Rolaids on an as needed basis. She feels like the Nexium is not really effective.  HLD: Her last LDL was 83, 03/2016. She is taking Lipitor and ASA daily. She denies myalgias. She does try to consume a low fat diet.  HTN: Her BP today is 128/82. She is taking Benicar and HCTZ, along with a potassium supplement. ECG from 08/2014 reviewed.  IBS: She reports she has diarrhea only when she has heartburn. She does not take anything OTC for this.  OSA: Sleep study from 09/2014 reviewed. She does not use her CPAP. She averages 8-9 hours of sleep per night. She denies daytime fatigue.   She also c/o back pain. This started about 6 months ago. The pain only seems to occur if she is trying to walk for exercise. The pain does not radiate. She denies numbness or tingling in her legs. She deniess associated weakness. She reports she can walk in the grocery store pushing a cart with no problem. She has not been able to exercise because of this. She denies previous back injury. She has taken Tylenol and Aleve with good relief.   Past Medical History:  Diagnosis Date  . Bradycardia   . Breast cancer ([place]) 08/29/14   Right Breast -High Grade Invasive Mammary  . Cataract    surgery  . GERD (gastroesophageal reflux disease)   . History of shingles   . Hx of radiation therapy 10/01/14- 11/20/14   right breast/50.4 Gy/28 fx; right breast boost/10 Gy/5 fx  . Hyperlipidemia   . Hypertension   . IBS (irritable bowel syndrome)   . Multifocal PVCs   . PVC (premature ventricular contraction)    Hx of  . Sleep apnea    no cpap per pt     Current  Outpatient Prescriptions  Medication Sig Dispense Refill  . aspirin 81 MG tablet Take 81 mg by mouth daily.    Marland Kitchen atorvastatin (LIPITOR) 10 MG tablet Take 1 tablet (10 mg total) by mouth daily. 90 tablet 1  . betamethasone dipropionate (DIPROLENE) 0.05 % cream Apply topically as needed.    . cycloSPORINE (RESTASIS) 0.05 % ophthalmic emulsion Place 1 drop into both eyes 2 (two) times daily.      Marland Kitchen esomeprazole (NEXIUM) 40 MG capsule Take 40 mg by mouth as needed.    . fluticasone (FLONASE) 50 MCG/ACT nasal spray Place 2 sprays into both nostrils daily. 16 g 6  . hydrochlorothiazide (HYDRODIURIL) 25 MG tablet Take 1 tablet (25 mg total) by mouth daily. 90 tablet 1  . Multiple Vitamins-Minerals (HAIR/SKIN/NAILS PO) Take 1 tablet by mouth daily.    Marland Kitchen olmesartan (BENICAR) 20 MG tablet Take 1 tablet by mouth  daily 90 tablet 0  . Potassium 99 MG TABS Take 1 tablet by mouth daily.       No current facility-administered medications for this visit.     No Known Allergies  Family History  Problem Relation Age of Onset  . Hypertension Mother   . Heart disease Mother     CAD  . Breast cancer Mother 81  . Hypertension Sister   . Breast cancer Sister 45  . Aneurysm  Sister   . Hypertension Brother   . Prostate cancer Brother 35  . Leukemia Maternal Uncle 39  . Heart disease Maternal Grandmother   . Leukemia Maternal Uncle 43  . Breast cancer Cousin     dx under 76  . Colon cancer Cousin     dx in her 38s  . Colon cancer Cousin   . Lymphoma Other 49    NHL  . Colon polyps Daughter   . Stroke Father     Social History   Social History  . Marital status: Married    Spouse name: Homer  . Number of children: 1  . Years of education: 75   Occupational History  .      retired   Social History Main Topics  . Smoking status: Never Smoker  . Smokeless tobacco: Never Used  . Alcohol use No  . Drug use: No  . Sexual activity: No   Other Topics Concern  . Not on file   Social  History Narrative   Consumes one glass of caffeine daily    Hospitiliaztions: None  Health Maintenance:    Flu: 07/2016  Tetanus: 04/2012  Pneumovax: 02/2010  Prevnar: 08/2015  Zostavax: 11/2007  Mammogram: 08/2016  Pap Smear: 04/2012, no longer screening  Bone Density: 02/2016  Colon Screening: 07/2015  Eye Doctor: yearly  Dental Exam: biannually   Providers:   PCP: Webb Silversmith, NP-C  Medical Oncologist: Dr. Lindi Adie  Radiation Oncologist: Dr. Isidore Moos  Surgical Oncologist: Dr. Marcello Moores  Gastroenterologist: Dr. Silverio Decamp  Neurology: Dr. Rexene Alberts  Opthalmologist: Dr. Bing Plume  Dentist: Dr. Carman Ching   I have personally reviewed and have noted:  1. The patient's medical and social history 2. Their use of alcohol, tobacco or illicit drugs 3. Their current medications and supplements 4. The patient's functional ability including ADL's, fall risks, home safety risks and hearing or visual impairment. 5. Diet and physical activities 6. Evidence for depression or mood disorder  Subjective:   Review of Systems:   Constitutional: Denies fever, malaise, fatigue, headache or abrupt weight changes.  HEENT: Denies eye pain, eye redness, ear pain, ringing in the ears, wax buildup, runny nose, nasal congestion, bloody nose, or sore throat. Respiratory: Denies difficulty breathing, shortness of breath, cough or sputum production.   Cardiovascular: Denies chest pain, chest tightness, palpitations or swelling in the hands or feet.  Gastrointestinal: Pt reports heartburn. Denies abdominal pain, bloating, constipation, diarrhea or blood in the stool.  GU: Denies urgency, frequency, pain with urination, burning sensation, blood in urine, odor or discharge. Musculoskeletal: Pt reports low back pain. Denies decrease in range of motion, difficulty with gait, muscle pain or joint swelling.  Skin: Denies redness, rashes, lesions or ulcercations.  Neurological: Denies dizziness, difficulty with memory,  difficulty with speech or problems with balance and coordination.  Psych: Denies anxiety, depression, SI/HI.  No other specific complaints in a complete review of systems (except as listed in HPI above).  Objective:  PE:   BP 128/82   Pulse 76   Temp 97.5 F (36.4 C) (Oral)   Ht 5\' 6"  (1.676 m)   Wt 259 lb (117.5 kg)   SpO2 98%   BMI 41.80 kg/m   Wt Readings from Last 3 Encounters:  03/19/16 259 lb (117.5 kg)  01/16/16 258 lb 12.8 oz (117.4 kg)  01/03/16 262 lb 6.4 oz (119 kg)    General: Appears her stated age, obese in NAD. Cardiovascular: Normal rate and rhythm. S1,S2 noted.  No murmur,  rubs or gallops noted. . No carotid bruits noted. Pulmonary/Chest: Normal effort and positive vesicular breath sounds. No respiratory distress. No wheezes, rales or ronchi noted.  Abdomen: Soft and nontender. Normal bowel sounds. No distention or masses noted.  Musculoskeletal: Normal flexion, extension and rotation of the spine. Bony tenderness noted over the lumbar spine. Strength 5/5 BLE. Normal gait.  Neurological: Alert and oriented.  Psychiatric: Mood and affect normal. Behavior is normal. Judgment and thought content normal.     BMET    Component Value Date/Time   NA 138 03/19/2016 0911   NA 138 08/22/2014 1237   K 4.0 03/19/2016 0911   K 3.8 08/22/2014 1237   CL 100 03/19/2016 0911   CO2 31 03/19/2016 0911   CO2 28 08/22/2014 1237   GLUCOSE 102 (H) 03/19/2016 0911   GLUCOSE 102 08/22/2014 1237   BUN 16 03/19/2016 0911   BUN 16.0 08/22/2014 1237   CREATININE 0.93 03/19/2016 0911   CREATININE 1.1 08/22/2014 1237   CALCIUM 9.6 03/19/2016 0911   CALCIUM 9.8 08/22/2014 1237   GFRNONAA 53.06 02/17/2010 1454   GFRAA 72 12/01/2007 1105    Lipid Panel     Component Value Date/Time   CHOL 153 03/19/2016 0911   TRIG 128.0 03/19/2016 0911   HDL 43.50 03/19/2016 0911   CHOLHDL 4 03/19/2016 0911   VLDL 25.6 03/19/2016 0911   LDLCALC 84 03/19/2016 0911    CBC     Component Value Date/Time   WBC 8.0 08/27/2015 1601   RBC 4.68 08/27/2015 1601   HGB 13.8 08/27/2015 1601   HGB 13.9 08/22/2014 1237   HCT 41.4 08/27/2015 1601   HCT 42.7 08/22/2014 1237   PLT 319.0 08/27/2015 1601   PLT 345 08/22/2014 1237   MCV 88.5 08/27/2015 1601   MCV 88.7 08/22/2014 1237   MCH 29.0 08/22/2014 1237   MCHC 33.3 08/27/2015 1601   RDW 13.4 08/27/2015 1601   RDW 13.8 08/22/2014 1237   LYMPHSABS 2.9 08/22/2014 1237   MONOABS 0.8 08/22/2014 1237   EOSABS 0.4 08/22/2014 1237   BASOSABS 0.1 08/22/2014 1237    Hgb A1C Lab Results  Component Value Date   HGBA1C 5.5 08/27/2015      Assessment and Plan:   Medicare Annual Wellness Visit:  Diet:  She does eat meat. She consumes fruits and veggies daily. She rarely eats fried foods. She drinks mostly water, some sweet tea. Physical activity: None, due to back pain. Depression/mood screen: Negative Hearing: Intact to whispered voice Visual acuity: Grossly normal, performs annual eye exam  ADLs: Capable Fall risk: None Home safety: Good Cognitive evaluation: Intact to orientation, naming, recall and repetition EOL planning: Adv directives, full code/ I agree  Preventative Medicine: All of her vaccines are UTD. She is no longer screening for cervical cancer. Mammogram, bone density and colon screening UTD. Encouraged her to consume a balanced diet and exercise regimen. Advised her to see an eye doctor and dentist annually. Will check CBC, CMET, and Lipid Profile today.   Next appointment: 1 year, Medicare Wellness  Low back pain:  Likely arthritis Xray of lumbar spine today Encouraged weight loss Can take Ibuprofen or Aleve as needed  IBS:  Intermittent diarrhea Advised her to take Imodium OTC as needed   Webb Silversmith, NP

## 2016-11-19 NOTE — Assessment & Plan Note (Signed)
Try to avoid triggers Discussed how weight  Advised her that Nexium should not be taken as needed, and to start taking it daily Continue Rolaids prn If no improvement, let me know in 4 weeks.

## 2016-11-19 NOTE — Assessment & Plan Note (Signed)
Encouraged her to lose some weight Will monitor for now

## 2016-11-19 NOTE — Assessment & Plan Note (Signed)
Controlled for age on Benicar,  HCTZ and potassium supplement CMET today

## 2016-12-02 DIAGNOSIS — Z853 Personal history of malignant neoplasm of breast: Secondary | ICD-10-CM | POA: Diagnosis not present

## 2016-12-20 ENCOUNTER — Telehealth: Payer: Self-pay

## 2016-12-20 NOTE — Telephone Encounter (Signed)
Called and left a message with new  appt from 01/14/17   Juanya Villavicencio

## 2017-01-14 ENCOUNTER — Ambulatory Visit: Payer: BLUE CROSS/BLUE SHIELD | Admitting: Hematology and Oncology

## 2017-01-27 ENCOUNTER — Ambulatory Visit: Payer: BLUE CROSS/BLUE SHIELD | Admitting: Hematology and Oncology

## 2017-01-31 NOTE — Assessment & Plan Note (Signed)
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  Breast cancer surveillance: 1. Breast exam 02/01/17 does not reveal any areas of concern for recurrent breast cancer. 2. Mammogram 08/18/2016 at [facility]: Benign; Density B  Return to clinic in 1 year for continued surveillance.

## 2017-02-01 ENCOUNTER — Ambulatory Visit (HOSPITAL_BASED_OUTPATIENT_CLINIC_OR_DEPARTMENT_OTHER): Payer: Medicare Other | Admitting: Hematology and Oncology

## 2017-02-01 ENCOUNTER — Encounter: Payer: Self-pay | Admitting: Hematology and Oncology

## 2017-02-01 DIAGNOSIS — Z853 Personal history of malignant neoplasm of breast: Secondary | ICD-10-CM

## 2017-02-01 DIAGNOSIS — C50511 Malignant neoplasm of lower-outer quadrant of right female breast: Secondary | ICD-10-CM

## 2017-02-01 DIAGNOSIS — Z171 Estrogen receptor negative status [ER-]: Principal | ICD-10-CM

## 2017-02-01 NOTE — Progress Notes (Signed)
Patient Care Team: Jearld Fenton, NP as PCP - General (Internal Medicine) Nicholas Lose, MD as Consulting Physician (Hematology and Oncology) Excell Seltzer, MD as Consulting Physician (General Surgery) Eppie Gibson, MD as Attending Physician (Radiation Oncology) Holley Bouche, NP as Nurse Practitioner (Nurse Practitioner)  DIAGNOSIS:  Encounter Diagnosis  Name Primary?  . Malignant neoplasm of lower-outer quadrant of right breast of female, estrogen receptor negative ([place])     SUMMARY OF ONCOLOGIC HISTORY:   Breast cancer of lower-outer quadrant of right female breast ([place])   08/14/2014 Initial Diagnosis    Right breast high-grade invasive carcinoma spindle, epithelioid and squamous differentiation, ER 2%, PR 0%, HER-2 negative ratio 1.57, Ki-67 70%      08/21/2014 Breast MRI    Right breast enhancement at the site of the biopsy 1.2 x 1.2 x 1.4 cm enhancing mass, along lateral margin 1.8 cm hematoma      08/29/2014 Surgery    Right breast lumpectomy: High-grade invasive mammary carcinoma with squamous differentiation metaplastic carcinoma no LVI; margins negative, 2 SLN negative, grade 3, 1.5 cm, ER 0%, PR 0%, HER-2 negative ratio 1.19      10/01/2014 - 11/20/2014 Radiation Therapy    Right breast: Total of 50.4 Gy in 28 fractions.  Right breast boost: Total of 10 Gy in 5 fractions.       10/18/2014 Procedure    Genetic testing found a BRCA2 VUS called [id number], but was otherwise normal       CHIEF COMPLIANT: Surveillance of breast cancer  INTERVAL HISTORY: Miranda Mueller is a 76 year old lady with above-mentioned history of right breast cancer who underwent lumpectomy and radiation. She was triple negative. She had refused chemotherapy.  She is currently on surveillance.  She denies any pain lumps or nodules in the breasts. She complains of arthritis in the right hip. It does not appear to be too severe. Pain is rated at 3-4. It gets worse with exertion and  exercise.  REVIEW OF SYSTEMS:   Constitutional: Denies fevers, chills or abnormal weight loss Eyes: Denies blurriness of vision Ears, nose, mouth, throat, and face: Denies mucositis or sore throat Respiratory: Denies cough, dyspnea or wheezes Cardiovascular: Denies palpitation, chest discomfort Gastrointestinal:  Denies nausea, heartburn or change in bowel habits Skin: Denies abnormal skin rashes Lymphatics: Denies new lymphadenopathy or easy bruising Neurological:Denies numbness, tingling or new weaknesses Behavioral/Psych: Mood is stable, no new changes  Extremities: No lower extremity edema Breast:  denies any pain or lumps or nodules in either breasts All other systems were reviewed with the patient and are negative.  I have reviewed the past medical history, past surgical history, social history and family history with the patient and they are unchanged from previous note.  ALLERGIES:  has No Known Allergies.  MEDICATIONS:  Current Outpatient Prescriptions  Medication Sig Dispense Refill  . aspirin 81 MG tablet Take 81 mg by mouth daily.    Marland Kitchen atorvastatin (LIPITOR) 10 MG tablet Take 1 tablet (10 mg total) by mouth daily. 90 tablet 1  . betamethasone dipropionate (DIPROLENE) 0.05 % cream Apply topically as needed.    . cycloSPORINE (RESTASIS) 0.05 % ophthalmic emulsion Place 1 drop into both eyes 2 (two) times daily.      Marland Kitchen esomeprazole (NEXIUM) 40 MG capsule Take 40 mg by mouth as needed.    . fluticasone (FLONASE) 50 MCG/ACT nasal spray Place 2 sprays into both nostrils daily. 16 g 6  . hydrochlorothiazide (HYDRODIURIL) 25 MG tablet Take 1  tablet (25 mg total) by mouth daily. 90 tablet 1  . Multiple Vitamins-Minerals (HAIR/SKIN/NAILS PO) Take 1 tablet by mouth daily.    Marland Kitchen olmesartan (BENICAR) 20 MG tablet Take 1 tablet by mouth  daily 90 tablet 0  . Potassium 99 MG TABS Take 1 tablet by mouth daily.       No current facility-administered medications for this visit.      PHYSICAL EXAMINATION: ECOG PERFORMANCE STATUS: 1 - Symptomatic but completely ambulatory  Vitals:   02/01/17 0821  BP: 139/75  Pulse: 66  Resp: 18  Temp: 97.9 F (36.6 C)   Filed Weights   02/01/17 0821  Weight: 265 lb (120.2 kg)    GENERAL:alert, no distress and comfortable SKIN: skin color, texture, turgor are normal, no rashes or significant lesions EYES: normal, Conjunctiva are pink and non-injected, sclera clear OROPHARYNX:no exudate, no erythema and lips, buccal mucosa, and tongue normal  NECK: supple, thyroid normal size, non-tender, without nodularity LYMPH:  no palpable lymphadenopathy in the cervical, axillary or inguinal LUNGS: clear to auscultation and percussion with normal breathing effort HEART: regular rate & rhythm and no murmurs and no lower extremity edema ABDOMEN:abdomen soft, non-tender and normal bowel sounds MUSCULOSKELETAL:no cyanosis of digits and no clubbing  NEURO: alert & oriented x 3 with fluent speech, no focal motor/sensory deficits EXTREMITIES: No lower extremity edema BREAST: No palpable masses or nodules in either right or left breasts. No palpable axillary supraclavicular or infraclavicular adenopathy no breast tenderness or nipple discharge. (exam performed in the presence of a chaperone)  LABORATORY DATA:  I have reviewed the data as listed   Chemistry      Component Value Date/Time   NA 139 11/19/2016 0851   NA 138 08/22/2014 1237   K 3.9 11/19/2016 0851   K 3.8 08/22/2014 1237   CL 102 11/19/2016 0851   CO2 32 11/19/2016 0851   CO2 28 08/22/2014 1237   BUN 22 11/19/2016 0851   BUN 16.0 08/22/2014 1237   CREATININE 1.00 11/19/2016 0851   CREATININE 1.1 08/22/2014 1237      Component Value Date/Time   CALCIUM 9.4 11/19/2016 0851   CALCIUM 9.8 08/22/2014 1237   ALKPHOS 47 11/19/2016 0851   ALKPHOS 50 08/22/2014 1237   AST 14 11/19/2016 0851   AST 17 08/22/2014 1237   ALT 16 11/19/2016 0851   ALT 22 08/22/2014 1237    BILITOT 0.7 11/19/2016 0851   BILITOT 0.42 08/22/2014 1237       Lab Results  Component Value Date   WBC 6.3 11/19/2016   HGB 13.5 11/19/2016   HCT 40.2 11/19/2016   MCV 87.6 11/19/2016   PLT 325.0 11/19/2016   NEUTROABS 5.5 08/22/2014    ASSESSMENT & PLAN:  Breast cancer of lower-outer quadrant of right female breast 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  Breast cancer surveillance: 1. Breast exam 02/01/17 does not reveal any areas of concern for recurrent breast cancer. 2. Mammogram 08/18/2016 at [facility]: Benign; Density B  Return to clinic in 1 year for continued surveillance.    I spent 25 minutes talking to the patient of which more than half was spent in counseling and coordination of care.  No orders of the defined types were placed in this encounter.  The patient  has a good understanding of the overall plan. she agrees with it. she will call with any problems that may develop before the next visit here.   Rulon Eisenmenger, MD 02/01/17

## 2017-03-12 ENCOUNTER — Other Ambulatory Visit: Payer: Self-pay | Admitting: Internal Medicine

## 2017-03-12 DIAGNOSIS — I10 Essential (primary) hypertension: Secondary | ICD-10-CM

## 2017-05-20 ENCOUNTER — Other Ambulatory Visit: Payer: Self-pay | Admitting: Internal Medicine

## 2017-05-20 DIAGNOSIS — J309 Allergic rhinitis, unspecified: Secondary | ICD-10-CM

## 2017-06-09 ENCOUNTER — Other Ambulatory Visit: Payer: Self-pay | Admitting: Internal Medicine

## 2017-06-09 DIAGNOSIS — I10 Essential (primary) hypertension: Secondary | ICD-10-CM

## 2017-07-01 DIAGNOSIS — C50911 Malignant neoplasm of unspecified site of right female breast: Secondary | ICD-10-CM | POA: Diagnosis not present

## 2017-08-04 ENCOUNTER — Encounter: Payer: Self-pay | Admitting: Internal Medicine

## 2017-08-04 ENCOUNTER — Ambulatory Visit (INDEPENDENT_AMBULATORY_CARE_PROVIDER_SITE_OTHER): Payer: Medicare Other | Admitting: Internal Medicine

## 2017-08-04 DIAGNOSIS — M5442 Lumbago with sciatica, left side: Secondary | ICD-10-CM | POA: Diagnosis not present

## 2017-08-04 DIAGNOSIS — M47818 Spondylosis without myelopathy or radiculopathy, sacral and sacrococcygeal region: Secondary | ICD-10-CM | POA: Insufficient documentation

## 2017-08-04 MED ORDER — PREDNISONE 10 MG PO TABS: ORAL_TABLET | ORAL | 0 refills | Status: DC

## 2017-08-04 NOTE — Patient Instructions (Signed)
Spondylolisthesis Rehab  Ask your health care provider which exercises are safe for you. Do exercises exactly as told by your health care provider and adjust them as directed. It is normal to feel mild stretching, pulling, tightness, or discomfort as you do these exercises, but you should stop right away if you feel sudden pain or your pain gets worse. Do not begin these exercises until told by your health care provider.  Stretching and range of motion exercises  These exercises warm up your muscles and joints and improve the movement and flexibility of your hips and your back. These exercises may also help to relieve pain, numbness, and tingling.  Exercise A: Single knee to chest    1. Lie on your back on a firm surface with both legs straight.  2. Bend one of your knees. Use your hands to move your knee up toward your chest until you feel a gentle stretch in your lower back and buttock.  ? Hold your leg in this position by holding onto the front of your knee.  ? Keep your other leg as straight as possible.  3. Hold for __________ seconds.  4. Slowly return to the starting position.  5. Repeat this exercise with your other leg.  Repeat __________ times. Complete this exercise __________ times a day.  Exercise B: Double knee to chest    1. Lie on your back on a firm surface with both legs straight.  2. Bend one of your knees and move it toward your chest until you feel a gentle stretch in your lower back and buttock.  3. Tense your abdominal muscles and repeat the previous step with your other leg.  4. Hold both of your legs in this position by holding onto the backs of your thighs or the fronts of your knees.  5. Hold for __________ seconds.  6. Tense your abdominal muscles and slowly move your legs back to the floor, one leg at a time.  Repeat __________ times. Complete this exercise __________ times a day.  Strengthening exercises  These exercises build strength and endurance in your back. Endurance is the  ability to use your muscles for a long time, even after they get tired.  Exercise C: Pelvic tilt  1. Lie on your back on a firm bed or the floor. Bend your knees and keep your feet flat.  2. Tense your abdominal muscles. Tip your pelvis up toward the ceiling and flatten your lower back into the floor.  ? To help with this exercise, you may place a small towel under your lower back and try to push your back into the towel.  3. Hold for __________ seconds.  4. Let your muscles relax completely before you repeat this exercise.  Repeat __________ times. Complete this exercise __________ times a day.  Exercise D: Abdominal crunch    1. Lie on your back on a firm surface. Bend your knees and keep your feet flat. Cross your arms over your chest.  2. Tuck your chin down toward your chest, without bending your neck.  3. Use your abdominal muscles to lift your upper body off of the ground, straight up into the air.  ? Try to lift yourself until your shoulder blades are off the ground. You may need to work up to this.  ? Keep your lower back on the ground while you crunch upward.  ? Do not hold your breath.  4. Slowly lower yourself down. Keep your abdominal muscles tense until   you are back to the starting position.  Repeat __________ times. Complete this exercise __________ times a day.  Exercise E: Alternating arm and leg raises    1. Get on your hands and knees on a firm surface. If you are on a hard floor, you may want to use padding to cushion your knees, such as an exercise mat.  2. Line up your arms and legs. Your hands should be below your shoulders, and your knees should be below your hips.  3. Lift your left leg behind you. At the same time, raise your right arm and straighten it in front of you.  ? Do not lift your leg higher than your hip.  ? Do not lift your arm higher than your shoulder.  ? Keep your abdominal and back muscles tight.  ? Keep your hips facing the ground.  ? Do not arch your back.  ? Keep your  balance carefully, and do not hold your breath.  4. Hold for __________ seconds.  5. Slowly return to the starting position and repeat with your right leg and your left arm.  Repeat __________ times. Complete this exercise __________ times a day.  Posture and body mechanics    Body mechanics refers to the movements and positions of your body while you do your daily activities. Posture is part of body mechanics. Good posture and healthy body mechanics can help to relieve stress in your body's tissues and joints. Good posture means that your spine is in its natural S-curve position (your spine is neutral), your shoulders are pulled back slightly, and your head is not tipped forward. The following are general guidelines for applying improved posture and body mechanics to your everyday activities.  Standing     When standing, keep your spine neutral and your feet about hip-width apart. Keep a slight bend in your knees. Your ears, shoulders, and hips should line up.   When you do a task in which you stand in one place for a long time, place one foot up on a stable object that is 2-4 inches (5-10 cm) high, such as a footstool. This helps keep your spine neutral.  Sitting     When sitting, keep your spine neutral and keep your feet flat on the floor. Use a footrest, if necessary, and keep your thighs parallel to the floor. Avoid rounding your shoulders, and avoid tilting your head forward.   When working at a desk or a computer, keep your desk at a height where your hands are slightly lower than your elbows. Slide your chair under your desk so you are close enough to maintain good posture.   When working at a computer, place your monitor at a height where you are looking straight ahead and you do not have to tilt your head forward or downward to look at the screen.  Resting    When lying down and resting, avoid positions that are most painful for you.   If you have pain with activities such as sitting, bending,  stooping, or squatting (flexion-based activities), lie in a position in which your body does not bend very much. For example, avoid curling up on your side with your arms and knees near your chest (fetal position).   If you have pain with activities such as standing for a long time or reaching with your arms (extension-based activities), lie with your spine in a neutral position and bend your knees slightly. Try the following positions:  ? Lying on   your side with a pillow between your knees.  ? Lying on your back with a pillow under your knees.    Lifting     When lifting objects, keep your feet at least shoulder-width apart and tighten your abdominal muscles.   Bend your knees and hips and keep your spine neutral. It is important to lift using the strength of your legs, not your back. Do not lock your knees straight out.   Always ask for help to lift heavy or awkward objects.  This information is not intended to replace advice given to you by your health care provider. Make sure you discuss any questions you have with your health care provider.  Document Released: 09/28/2005 Document Revised: 06/04/2016 Document Reviewed: 07/09/2015  [employer]vier Interactive Patient Education  2018 [employer]

## 2017-08-04 NOTE — Progress Notes (Signed)
Subjective:    Patient ID: Miranda Mueller, female    DOB: Feb 12, 1941, 76 y.o.   MRN: [id number]  HPI  Pt presents to the clinic today with c/o left leg pain. This started 4-5 weeks ago. She reports the pain starts in her left buttocks and extends into her left leg. She describes the pain as a constant ache. She does have associated numbness and weakness at times, but no tingling. The pain is worse with standing or getting into the car. She has a history of chronic back pain. Xray of the lumbar spine showed:  FINDINGS: Frontal, lateral, spot lumbosacral lateral, and bilateral oblique views were obtained. There are 5 non-rib-bearing lumbar type vertebral bodies. There is no fracture or spondylolisthesis. There is moderate disc space narrowing at all levels. There are anterior osteophytes at all levels, most notably at L2, L3, and L4. There is facet osteoarthritic change at L3-4, L4-5, and L5-S1 bilaterally.  She has tried heat, Biofreeze, Tylenol, Aleve and Ibuprofen with some relief.  Review of Systems      Past Medical History:  Diagnosis Date  . Bradycardia   . Breast cancer ([place]) 08/29/14   Right Breast -High Grade Invasive Mammary  . Cataract    surgery  . GERD (gastroesophageal reflux disease)   . History of shingles   . Hx of radiation therapy 10/01/14- 11/20/14   right breast/50.4 Gy/28 fx; right breast boost/10 Gy/5 fx  . Hyperlipidemia   . Hypertension   . IBS (irritable bowel syndrome)   . Multifocal PVCs   . PVC (premature ventricular contraction)    Hx of  . Sleep apnea    no cpap per pt     Current Outpatient Prescriptions  Medication Sig Dispense Refill  . aspirin 81 MG tablet Take 81 mg by mouth daily.    Marland Kitchen atorvastatin (LIPITOR) 10 MG tablet TAKE 1 TABLET BY MOUTH DAILY 90 tablet 2  . betamethasone dipropionate (DIPROLENE) 0.05 % cream Apply topically as needed.    . cycloSPORINE (RESTASIS) 0.05 % ophthalmic emulsion Place 1 drop into both eyes 2 (two)  times daily.      Marland Kitchen esomeprazole (NEXIUM) 40 MG capsule Take 40 mg by mouth as needed.    . fluticasone (FLONASE) 50 MCG/ACT nasal spray USE TWO SPRAYS IN EACH NOSTRIL DAILY 16 g 3  . hydrochlorothiazide (HYDRODIURIL) 25 MG tablet TAKE 1 TABLET BY MOUTH DAILY 90 tablet 2  . Multiple Vitamins-Minerals (HAIR/SKIN/NAILS PO) Take 1 tablet by mouth daily.    Marland Kitchen olmesartan (BENICAR) 20 MG tablet TAKE 1 TABLET BY MOUTH DAILY 90 tablet 1  . Potassium 99 MG TABS Take 1 tablet by mouth daily.       No current facility-administered medications for this visit.     No Known Allergies  Family History  Problem Relation Age of Onset  . Hypertension Mother   . Heart disease Mother        CAD  . Breast cancer Mother 45  . Hypertension Sister   . Breast cancer Sister 65  . Aneurysm Sister   . Hypertension Brother   . Prostate cancer Brother 51  . Leukemia Maternal Uncle 62  . Heart disease Maternal Grandmother   . Leukemia Maternal Uncle 23  . Breast cancer Cousin        dx under 54  . Colon cancer Cousin        dx in her 37s  . Colon cancer Cousin   . Lymphoma Other  49       [employer]HL  . Colon polyps Daughter   . Stroke Father     Social History   Social History  . Marital status: Married    Spouse name: Homer  . Number of children: 1  . Years of education: 60   Occupational History  .      retired   Social History Main Topics  . Smoking status: Never Smoker  . Smokeless tobacco: Never Used  . Alcohol use No  . Drug use: No  . Sexual activity: No   Other Topics Concern  . Not on file   Social History Narrative   Consumes one glass of caffeine daily     Constitutional: Denies fever, malaise, fatigue, headache or abrupt weight changes.  Musculoskeletal: Pt reports back and leg pain. Denies difficulty with gait, muscle pain or joint pain and swelling.  Neurological: Pt reports numbness and weakness in left leg. Denies dizziness, difficulty with memory, difficulty with speech or  problems with balance and coordination.    No other specific complaints in a complete review of systems (except as listed in HPI above).  Objective:   Physical Exam   BP 130/80   Pulse 87   Temp 97.7 F (36.5 C) (Oral)   Wt 263 lb (119.3 kg)   SpO2 97%   BMI 42.45 kg/m  Wt Readings from Last 3 Encounters:  08/04/17 263 lb (119.3 kg)  02/01/17 265 lb (120.2 kg)  11/19/16 259 lb (117.5 kg)    General: Appears her stated age, obese in NAD. Musculoskeletal: Normal flexion and rotation to the left. Decreased extension and pain with rotation to the left. Pain with palpation of the lower lumbar spine and left SI joint. Strength 4/5 LLE, 5/5 RLE. No difficulty with gait.  Neurological: Alert and oriented. Sensation intact to BLE.  BMET    Component Value Date/Time   NA 139 11/19/2016 0851   NA 138 08/22/2014 1237   K 3.9 11/19/2016 0851   K 3.8 08/22/2014 1237   CL 102 11/19/2016 0851   CO2 32 11/19/2016 0851   CO2 28 08/22/2014 1237   GLUCOSE 106 (H) 11/19/2016 0851   GLUCOSE 102 08/22/2014 1237   BUN 22 11/19/2016 0851   BUN 16.0 08/22/2014 1237   CREATININE 1.00 11/19/2016 0851   CREATININE 1.1 08/22/2014 1237   CALCIUM 9.4 11/19/2016 0851   CALCIUM 9.8 08/22/2014 1237   GFRNONAA 53.06 02/17/2010 1454   GFRAA 72 12/01/2007 1105    Lipid Panel     Component Value Date/Time   CHOL 147 11/19/2016 0851   TRIG 103.0 11/19/2016 0851   HDL 43.00 11/19/2016 0851   CHOLHDL 3 11/19/2016 0851   VLDL 20.6 11/19/2016 0851   LDLCALC 84 11/19/2016 0851    CBC    Component Value Date/Time   WBC 6.3 11/19/2016 0851   RBC 4.59 11/19/2016 0851   HGB 13.5 11/19/2016 0851   HGB 13.9 08/22/2014 1237   HCT 40.2 11/19/2016 0851   HCT 42.7 08/22/2014 1237   PLT 325.0 11/19/2016 0851   PLT 345 08/22/2014 1237   MCV 87.6 11/19/2016 0851   MCV 88.7 08/22/2014 1237   MCH 29.0 08/22/2014 1237   MCHC 33.7 11/19/2016 0851   RDW 13.7 11/19/2016 0851   RDW 13.8 08/22/2014 1237    LYMPHSABS 2.9 08/22/2014 1237   MONOABS 0.8 08/22/2014 1237   EOSABS 0.4 08/22/2014 1237   BASOSABS 0.1 08/22/2014 1237    Hgb A1C  Lab Results  Component Value Date   HGBA1C 5.5 08/27/2015           Assessment & Plan:   Low Back Pain with Left Side Sciatica:  Hold Aleve and Ibuprofen eRx for Pred Taper x 9 days Back exercises given Continue Biofreeze and heat If no improvement, consider PT vs MRI  RTC as needed or if symptoms persist or worsen BAITY, REGINA, NP

## 2017-08-05 DIAGNOSIS — H04123 Dry eye syndrome of bilateral lacrimal glands: Secondary | ICD-10-CM | POA: Diagnosis not present

## 2017-08-05 DIAGNOSIS — H524 Presbyopia: Secondary | ICD-10-CM | POA: Diagnosis not present

## 2017-09-28 DIAGNOSIS — Z853 Personal history of malignant neoplasm of breast: Secondary | ICD-10-CM | POA: Diagnosis not present

## 2017-09-28 DIAGNOSIS — R921 Mammographic calcification found on diagnostic imaging of breast: Secondary | ICD-10-CM | POA: Diagnosis not present

## 2017-09-28 DIAGNOSIS — R922 Inconclusive mammogram: Secondary | ICD-10-CM | POA: Diagnosis not present

## 2017-09-28 LAB — HM MAMMOGRAPHY

## 2017-10-28 ENCOUNTER — Encounter: Payer: Self-pay | Admitting: Internal Medicine

## 2017-11-15 ENCOUNTER — Other Ambulatory Visit: Payer: Self-pay | Admitting: Internal Medicine

## 2017-11-15 DIAGNOSIS — I10 Essential (primary) hypertension: Secondary | ICD-10-CM

## 2017-12-09 ENCOUNTER — Ambulatory Visit (INDEPENDENT_AMBULATORY_CARE_PROVIDER_SITE_OTHER): Payer: Medicare Other | Admitting: Internal Medicine

## 2017-12-09 ENCOUNTER — Encounter: Payer: Self-pay | Admitting: Internal Medicine

## 2017-12-09 VITALS — BP 132/80 | HR 80 | Temp 98.2°F | Ht 66.0 in | Wt 262.0 lb

## 2017-12-09 DIAGNOSIS — Z0000 Encounter for general adult medical examination without abnormal findings: Secondary | ICD-10-CM

## 2017-12-09 DIAGNOSIS — K219 Gastro-esophageal reflux disease without esophagitis: Secondary | ICD-10-CM

## 2017-12-09 DIAGNOSIS — E7800 Pure hypercholesterolemia, unspecified: Secondary | ICD-10-CM

## 2017-12-09 DIAGNOSIS — Z171 Estrogen receptor negative status [ER-]: Secondary | ICD-10-CM | POA: Diagnosis not present

## 2017-12-09 DIAGNOSIS — E559 Vitamin D deficiency, unspecified: Secondary | ICD-10-CM

## 2017-12-09 DIAGNOSIS — G4733 Obstructive sleep apnea (adult) (pediatric): Secondary | ICD-10-CM | POA: Diagnosis not present

## 2017-12-09 DIAGNOSIS — C50511 Malignant neoplasm of lower-outer quadrant of right female breast: Secondary | ICD-10-CM | POA: Diagnosis not present

## 2017-12-09 DIAGNOSIS — I10 Essential (primary) hypertension: Secondary | ICD-10-CM

## 2017-12-09 NOTE — Progress Notes (Signed)
HPI:  Pt presents to the clinic today for her Medicare Wellness Exam. She is also due to follow up chronic conditions.  History of Breast Cancer: In remission. She follows with Dr. Lindi Adie yearly.  GERD: Controlled on Nexium. She denies breakthrough symptoms.   HLD: Her last LDL was 84, 11/2016. She denies myalgias on Atorvastatin. She tries to consume a low fat diet.  HTN: Her BP today is 132/80. She is taking Olmesartan, HCTZ and potassium as prescribed. ECG from 08/2014 reviewed.  OSA: She does not wear a CPAP. She reports she has been sleeping well at night.   Past Medical History:  Diagnosis Date  . Bradycardia   . Breast cancer ([place]) 08/29/14   Right Breast -High Grade Invasive Mammary  . Cataract    surgery  . GERD (gastroesophageal reflux disease)   . History of shingles   . Hx of radiation therapy 10/01/14- 11/20/14   right breast/50.4 Gy/28 fx; right breast boost/10 Gy/5 fx  . Hyperlipidemia   . Hypertension   . IBS (irritable bowel syndrome)   . Multifocal PVCs   . PVC (premature ventricular contraction)    Hx of  . Sleep apnea    no cpap per pt     Current Outpatient Medications  Medication Sig Dispense Refill  . aspirin 81 MG tablet Take 81 mg by mouth daily.    Marland Kitchen atorvastatin (LIPITOR) 10 MG tablet TAKE 1 TABLET BY MOUTH DAILY 90 tablet 2  . betamethasone dipropionate (DIPROLENE) 0.05 % cream Apply topically as needed.    . cholecalciferol (VITAMIN D) 1000 units tablet Take 1,000 Units by mouth daily.    . cycloSPORINE (RESTASIS) 0.05 % ophthalmic emulsion Place 1 drop into both eyes 2 (two) times daily.      Marland Kitchen esomeprazole (NEXIUM) 40 MG capsule Take 40 mg by mouth as needed.    . fluticasone (FLONASE) 50 MCG/ACT nasal spray USE TWO SPRAYS IN EACH NOSTRIL DAILY 16 g 3  . hydrochlorothiazide (HYDRODIURIL) 25 MG tablet TAKE 1 TABLET BY MOUTH DAILY 90 tablet 0  . Multiple Vitamins-Minerals (HAIR/SKIN/NAILS PO) Take 1 tablet by mouth daily.    Marland Kitchen olmesartan  (BENICAR) 20 MG tablet TAKE 1 TABLET BY MOUTH DAILY 90 tablet 0  . Potassium 99 MG TABS Take 1 tablet by mouth daily.       No current facility-administered medications for this visit.     No Known Allergies  Family History  Problem Relation Age of Onset  . Hypertension Mother   . Heart disease Mother        CAD  . Breast cancer Mother 32  . Hypertension Sister   . Breast cancer Sister 86  . Aneurysm Sister   . Hypertension Brother   . Prostate cancer Brother 7  . Leukemia Maternal Uncle 108  . Heart disease Maternal Grandmother   . Leukemia Maternal Uncle 87  . Breast cancer Cousin        dx under 70  . Colon cancer Cousin        dx in her 15s  . Colon cancer Cousin   . Lymphoma Other 49       NHL  . Colon polyps Daughter   . Stroke Father     Social History   Socioeconomic History  . Marital status: Married    Spouse name: Homer  . Number of children: 1  . Years of education: 57  . Highest education level: Not on file  Social Needs  .  Financial resource strain: Not on file  . Food insecurity - worry: Not on file  . Food insecurity - inability: Not on file  . Transportation needs - medical: Not on file  . Transportation needs - non-medical: Not on file  Occupational History    Comment: retired  Tobacco Use  . Smoking status: Never Smoker  . Smokeless tobacco: Never Used  Substance and Sexual Activity  . Alcohol use: No    Alcohol/week: 0.0 oz  . Drug use: No  . Sexual activity: No  Other Topics Concern  . Not on file  Social History Narrative   Consumes one glass of caffeine daily    Hospitiliaztions: None  Health Maintenance:    Flu: 07/2017  Tetanus: 04/2012  Pneumovax: 02/2010  Prevnar: 08/2015  Zostavax: 11/2007  Mammogram: 09/2017  Pap Smear: 04/2012, no longer screening  Bone Density: 02/2016  Colon Screening: 07/2015  Eye Doctor: annually  Dental Exam: biannually   Providers:   PCP: Webb Silversmith, NP-C  Oncologist: Dr. Lindi Adie   I  have personally reviewed and have noted:  1. The patient's medical and social history 2. Their use of alcohol, tobacco or illicit drugs 3. Their current medications and supplements 4. The patient's functional ability including ADL's, fall risks, home safety risks and hearing or visual impairment. 5. Diet and physical activities 6. Evidence for depression or mood disorder  Subjective:   Review of Systems:   Constitutional: Denies fever, malaise, fatigue, headache or abrupt weight changes.  HEENT: Denies eye pain, eye redness, ear pain, ringing in the ears, wax buildup, runny nose, nasal congestion, bloody nose, or sore throat. Respiratory: Denies difficulty breathing, shortness of breath, cough or sputum production.   Cardiovascular: Denies chest pain, chest tightness, palpitations or swelling in the hands or feet.  Gastrointestinal: Pt reports intermittent constipation. Denies abdominal pain, bloating, constipation, diarrhea or blood in the stool.  GU: Denies urgency, frequency, pain with urination, burning sensation, blood in urine, odor or discharge. Musculoskeletal: Denies decrease in range of motion, difficulty with gait, muscle pain or joint pain and swelling.  Skin: Denies redness, rashes, lesions or ulcercations.  Neurological: Denies dizziness, difficulty with memory, difficulty with speech or problems with balance and coordination.  Psych: Denies anxiety, depression, SI/HI.  No other specific complaints in a complete review of systems (except as listed in HPI above).  Objective:  PE:  BP 132/80   Pulse 80   Temp 98.2 F (36.8 C) (Oral)   Ht 5\' 6"  (1.676 m)   Wt 262 lb (118.8 kg)   BMI 42.29 kg/m   Wt Readings from Last 3 Encounters:  12/09/17 262 lb (118.8 kg)  08/04/17 263 lb (119.3 kg)  02/01/17 265 lb (120.2 kg)    General: Appears her stated age, well developed, well nourished in NAD. Neck: Neck supple, trachea midline. No masses, lumps or thyromegaly  present.  Cardiovascular: Normal rate and rhythm. S1,S2 noted.  No murmur, rubs or gallops noted. No JVD or BLE edema. No carotid bruits noted. Pulmonary/Chest: Normal effort and positive vesicular breath sounds. No respiratory distress. No wheezes, rales or ronchi noted.  Abdomen: Soft and nontender. Normal bowel sounds. No distention or masses noted.  Musculoskeletal: Strength 5/5 BUE/BLE. No difficulty with gait.  Neurological: Alert and oriented. Cranial nerves II-XII grossly intact. Coordination normal.  Psychiatric: Mood and affect normal. Behavior is normal. Judgment and thought content normal.    BMET    Component Value Date/Time   NA 139 11/19/2016 0851  NA 138 08/22/2014 1237   K 3.9 11/19/2016 0851   K 3.8 08/22/2014 1237   CL 102 11/19/2016 0851   CO2 32 11/19/2016 0851   CO2 28 08/22/2014 1237   GLUCOSE 106 (H) 11/19/2016 0851   GLUCOSE 102 08/22/2014 1237   BUN 22 11/19/2016 0851   BUN 16.0 08/22/2014 1237   CREATININE 1.00 11/19/2016 0851   CREATININE 1.1 08/22/2014 1237   CALCIUM 9.4 11/19/2016 0851   CALCIUM 9.8 08/22/2014 1237   GFRNONAA 53.06 02/17/2010 1454   GFRAA 72 12/01/2007 1105    Lipid Panel     Component Value Date/Time   CHOL 147 11/19/2016 0851   TRIG 103.0 11/19/2016 0851   HDL 43.00 11/19/2016 0851   CHOLHDL 3 11/19/2016 0851   VLDL 20.6 11/19/2016 0851   LDLCALC 84 11/19/2016 0851    CBC    Component Value Date/Time   WBC 6.3 11/19/2016 0851   RBC 4.59 11/19/2016 0851   HGB 13.5 11/19/2016 0851   HGB 13.9 08/22/2014 1237   HCT 40.2 11/19/2016 0851   HCT 42.7 08/22/2014 1237   PLT 325.0 11/19/2016 0851   PLT 345 08/22/2014 1237   MCV 87.6 11/19/2016 0851   MCV 88.7 08/22/2014 1237   MCH 29.0 08/22/2014 1237   MCHC 33.7 11/19/2016 0851   RDW 13.7 11/19/2016 0851   RDW 13.8 08/22/2014 1237   LYMPHSABS 2.9 08/22/2014 1237   MONOABS 0.8 08/22/2014 1237   EOSABS 0.4 08/22/2014 1237   BASOSABS 0.1 08/22/2014 1237    Hgb  A1C Lab Results  Component Value Date   HGBA1C 5.5 08/27/2015      Assessment and Plan:   Medicare Annual Wellness Visit:  Diet: She does eat meat. She consumes fruits and veggies daily. She tries to avoid fried foods. She drinks  Mostly water, some sweet tea. Physical activity: Sedentary Depression/mood screen: Negative Hearing: Intact to whispered voice Visual acuity: Grossly normal, performs annual eye exam  ADLs: Capable Fall risk: None Home safety: Good Cognitive evaluation: Intact to orientation, naming, recall and repetition EOL planning: Adv directives, full code/ I agree  Preventative Medicine: Flu, tetanus, pneumovax, prevnar and zostovax UTD. She declines shingrix. Mammogram, bone density and colon screening UTD. She declines pap smears. Advised her to see an eye doctor and dentist annually. Will check CBC, CMET, Lipid and Vit D today.   Next appointment: 1 year, Medicare Wellness Exam   Webb Silversmith, NP

## 2017-12-10 LAB — LIPID PANEL
Cholesterol: 139 mg/dL (ref 0–200)
HDL: 40.4 mg/dL (ref >39.00)
LDL CALC: 77 mg/dL (ref 0–99)
NonHDL: 98.4
TRIGLYCERIDES: 105 mg/dL (ref 0.0–149.0)
Total CHOL/HDL Ratio: 3
VLDL: 21 mg/dL (ref 0.0–40.0)

## 2017-12-10 LAB — CBC
HCT: 39.5 % (ref 36.0–46.0)
Hemoglobin: 13.3 g/dL (ref 12.0–15.0)
MCHC: 33.7 g/dL (ref 30.0–36.0)
MCV: 87.8 fl (ref 78.0–100.0)
Platelets: 335 10*3/uL (ref 150.0–400.0)
RBC: 4.5 Mil/uL (ref 3.87–5.11)
RDW: 14 % (ref 11.5–15.5)
WBC: 7.3 10*3/uL (ref 4.0–10.5)

## 2017-12-10 LAB — COMPREHENSIVE METABOLIC PANEL
ALK PHOS: 48 U/L (ref 39–117)
ALT: 22 U/L (ref 0–35)
AST: 19 U/L (ref 0–37)
Albumin: 3.8 g/dL (ref 3.5–5.2)
BUN: 15 mg/dL (ref 6–23)
CALCIUM: 9.9 mg/dL (ref 8.4–10.5)
CHLORIDE: 101 meq/L (ref 96–112)
CO2: 29 mEq/L (ref 19–32)
Creatinine, Ser: 0.98 mg/dL (ref 0.40–1.20)
GFR: 58.68 mL/min — ABNORMAL LOW (ref >60.00)
Glucose, Bld: 83 mg/dL (ref 70–99)
POTASSIUM: 4.1 meq/L (ref 3.5–5.1)
Sodium: 139 mEq/L (ref 135–145)
TOTAL PROTEIN: 7.5 g/dL (ref 6.0–8.3)
Total Bilirubin: 0.5 mg/dL (ref 0.2–1.2)

## 2017-12-10 LAB — VITAMIN D 25 HYDROXY (VIT D DEFICIENCY, FRACTURES): VITD: 31.91 ng/mL (ref 30.00–100.00)

## 2017-12-10 NOTE — Assessment & Plan Note (Signed)
In remission She will continue to follow with oncology 

## 2017-12-10 NOTE — Assessment & Plan Note (Signed)
Controlled on Nexium Discussed avoiding foods that trigger your reflux CMET today

## 2017-12-10 NOTE — Assessment & Plan Note (Signed)
Controlled on Olmesartan, HCTZ and Potassium CBC and CMET today Reinforced DASH diet and exercise for weight loss

## 2017-12-10 NOTE — Assessment & Plan Note (Signed)
Currently not an issue Will monitor 

## 2017-12-10 NOTE — Patient Instructions (Signed)
Health Maintenance for Postmenopausal Women Menopause is a normal process in which your reproductive ability comes to an end. This process happens gradually over a span of months to years, usually between the ages of 22 and 9. Menopause is complete when you have missed 12 consecutive menstrual periods. It is important to talk with your health care provider about some of the most common conditions that affect postmenopausal women, such as heart disease, cancer, and bone loss (osteoporosis). Adopting a healthy lifestyle and getting preventive care can help to promote your health and wellness. Those actions can also lower your chances of developing some of these common conditions. What should I know about menopause? During menopause, you may experience a number of symptoms, such as:  Moderate-to-severe hot flashes.  Night sweats.  Decrease in sex drive.  Mood swings.  Headaches.  Tiredness.  Irritability.  Memory problems.  Insomnia.  Choosing to treat or not to treat menopausal changes is an individual decision that you make with your health care provider. What should I know about hormone replacement therapy and supplements? Hormone therapy products are effective for treating symptoms that are associated with menopause, such as hot flashes and night sweats. Hormone replacement carries certain risks, especially as you become older. If you are thinking about using estrogen or estrogen with progestin treatments, discuss the benefits and risks with your health care provider. What should I know about heart disease and stroke? Heart disease, heart attack, and stroke become more likely as you age. This may be due, in part, to the hormonal changes that your body experiences during menopause. These can affect how your body processes dietary fats, triglycerides, and cholesterol. Heart attack and stroke are both medical emergencies. There are many things that you can do to help prevent heart disease  and stroke:  Have your blood pressure checked at least every 1-2 years. High blood pressure causes heart disease and increases the risk of stroke.  If you are 53-22 years old, ask your health care provider if you should take aspirin to prevent a heart attack or a stroke.  Do not use any tobacco products, including cigarettes, chewing tobacco, or electronic cigarettes. If you need help quitting, ask your health care provider.  It is important to eat a healthy diet and maintain a healthy weight. ? Be sure to include plenty of vegetables, fruits, low-fat dairy products, and lean protein. ? Avoid eating foods that are high in solid fats, added sugars, or salt (sodium).  Get regular exercise. This is one of the most important things that you can do for your health. ? Try to exercise for at least 150 minutes each week. The type of exercise that you do should increase your heart rate and make you sweat. This is known as moderate-intensity exercise. ? Try to do strengthening exercises at least twice each week. Do these in addition to the moderate-intensity exercise.  Know your numbers.Ask your health care provider to check your cholesterol and your blood glucose. Continue to have your blood tested as directed by your health care provider.  What should I know about cancer screening? There are several types of cancer. Take the following steps to reduce your risk and to catch any cancer development as early as possible. Breast Cancer  Practice breast self-awareness. ? This means understanding how your breasts normally appear and feel. ? It also means doing regular breast self-exams. Let your health care provider know about any changes, no matter how small.  If you are 40  or older, have a clinician do a breast exam (clinical breast exam or CBE) every year. Depending on your age, family history, and medical history, it may be recommended that you also have a yearly breast X-ray (mammogram).  If you  have a family history of breast cancer, talk with your health care provider about genetic screening.  If you are at high risk for breast cancer, talk with your health care provider about having an MRI and a mammogram every year.  Breast cancer (BRCA) gene test is recommended for women who have family members with BRCA-related cancers. Results of the assessment will determine the need for genetic counseling and BRCA1 and for BRCA2 testing. BRCA-related cancers include these types: ? Breast. This occurs in males or females. ? Ovarian. ? Tubal. This may also be called fallopian tube cancer. ? Cancer of the abdominal or pelvic lining (peritoneal cancer). ? Prostate. ? Pancreatic.  Cervical, Uterine, and Ovarian Cancer Your health care provider may recommend that you be screened regularly for cancer of the pelvic organs. These include your ovaries, uterus, and vagina. This screening involves a pelvic exam, which includes checking for microscopic changes to the surface of your cervix (Pap test).  For women ages 21-65, health care providers may recommend a pelvic exam and a Pap test every three years. For women ages 79-65, they may recommend the Pap test and pelvic exam, combined with testing for human papilloma virus (HPV), every five years. Some types of HPV increase your risk of cervical cancer. Testing for HPV may also be done on women of any age who have unclear Pap test results.  Other health care providers may not recommend any screening for nonpregnant women who are considered low risk for pelvic cancer and have no symptoms. Ask your health care provider if a screening pelvic exam is right for you.  If you have had past treatment for cervical cancer or a condition that could lead to cancer, you need Pap tests and screening for cancer for at least 20 years after your treatment. If Pap tests have been discontinued for you, your risk factors (such as having a new sexual partner) need to be  reassessed to determine if you should start having screenings again. Some women have medical problems that increase the chance of getting cervical cancer. In these cases, your health care provider may recommend that you have screening and Pap tests more often.  If you have a family history of uterine cancer or ovarian cancer, talk with your health care provider about genetic screening.  If you have vaginal bleeding after reaching menopause, tell your health care provider.  There are currently no reliable tests available to screen for ovarian cancer.  Lung Cancer Lung cancer screening is recommended for adults 69-62 years old who are at high risk for lung cancer because of a history of smoking. A yearly low-dose CT scan of the lungs is recommended if you:  Currently smoke.  Have a history of at least 30 pack-years of smoking and you currently smoke or have quit within the past 15 years. A pack-year is smoking an average of one pack of cigarettes per day for one year.  Yearly screening should:  Continue until it has been 15 years since you quit.  Stop if you develop a health problem that would prevent you from having lung cancer treatment.  Colorectal Cancer  This type of cancer can be detected and can often be prevented.  Routine colorectal cancer screening usually begins at  age 42 and continues through age 45.  If you have risk factors for colon cancer, your health care provider may recommend that you be screened at an earlier age.  If you have a family history of colorectal cancer, talk with your health care provider about genetic screening.  Your health care provider may also recommend using home test kits to check for hidden blood in your stool.  A small camera at the end of a tube can be used to examine your colon directly (sigmoidoscopy or colonoscopy). This is done to check for the earliest forms of colorectal cancer.  Direct examination of the colon should be repeated every  5-10 years until age 71. However, if early forms of precancerous polyps or small growths are found or if you have a family history or genetic risk for colorectal cancer, you may need to be screened more often.  Skin Cancer  Check your skin from head to toe regularly.  Monitor any moles. Be sure to tell your health care provider: ? About any new moles or changes in moles, especially if there is a change in a mole's shape or color. ? If you have a mole that is larger than the size of a pencil eraser.  If any of your family members has a history of skin cancer, especially at a young age, talk with your health care provider about genetic screening.  Always use sunscreen. Apply sunscreen liberally and repeatedly throughout the day.  Whenever you are outside, protect yourself by wearing long sleeves, pants, a wide-brimmed hat, and sunglasses.  What should I know about osteoporosis? Osteoporosis is a condition in which bone destruction happens more quickly than new bone creation. After menopause, you may be at an increased risk for osteoporosis. To help prevent osteoporosis or the bone fractures that can happen because of osteoporosis, the following is recommended:  If you are 46-71 years old, get at least 1,000 mg of calcium and at least 600 mg of vitamin D per day.  If you are older than age 55 but younger than age 65, get at least 1,200 mg of calcium and at least 600 mg of vitamin D per day.  If you are older than age 54, get at least 1,200 mg of calcium and at least 800 mg of vitamin D per day.  Smoking and excessive alcohol intake increase the risk of osteoporosis. Eat foods that are rich in calcium and vitamin D, and do weight-bearing exercises several times each week as directed by your health care provider. What should I know about how menopause affects my mental health? Depression may occur at any age, but it is more common as you become older. Common symptoms of depression  include:  Low or sad mood.  Changes in sleep patterns.  Changes in appetite or eating patterns.  Feeling an overall lack of motivation or enjoyment of activities that you previously enjoyed.  Frequent crying spells.  Talk with your health care provider if you think that you are experiencing depression. What should I know about immunizations? It is important that you get and maintain your immunizations. These include:  Tetanus, diphtheria, and pertussis (Tdap) booster vaccine.  Influenza every year before the flu season begins.  Pneumonia vaccine.  Shingles vaccine.  Your health care provider may also recommend other immunizations. This information is not intended to replace advice given to you by your health care provider. Make sure you discuss any questions you have with your health care provider. Document Released: 11/20/2005  Document Revised: 04/17/2016 Document Reviewed: 07/02/2015 Elsevier Interactive Patient Education  2018 [employer]  

## 2017-12-10 NOTE — Assessment & Plan Note (Signed)
CMET and Lipid profile today Encouraged her to consume a low fat diet Continue Atorvastatin for now 

## 2017-12-30 ENCOUNTER — Other Ambulatory Visit: Payer: Self-pay | Admitting: Internal Medicine

## 2018-01-06 ENCOUNTER — Telehealth: Payer: Self-pay | Admitting: Internal Medicine

## 2018-01-06 NOTE — Telephone Encounter (Signed)
Copied from [place] [id number]. Topic: Quick Communication - See Telephone Encounter >> Jan 06, 2018 11:20 AM Cleaster Corin, NT wrote: CRM for notification. See Telephone encounter for: 01/06/18.  Pt. Would like to have most recent lab results mailed to her for insurance  [address] [place] [address]

## 2018-01-06 NOTE — Telephone Encounter (Signed)
I spoke with pt and she only wants the 12/09/17 lab results printed; I printed 12/09/17 flow sheets for labs and mailed to verified mailing address.

## 2018-01-21 ENCOUNTER — Other Ambulatory Visit: Payer: Self-pay

## 2018-01-26 ENCOUNTER — Other Ambulatory Visit: Payer: Self-pay | Admitting: Internal Medicine

## 2018-01-26 DIAGNOSIS — J309 Allergic rhinitis, unspecified: Secondary | ICD-10-CM

## 2018-02-01 ENCOUNTER — Ambulatory Visit: Payer: BLUE CROSS/BLUE SHIELD | Admitting: Hematology and Oncology

## 2018-02-04 ENCOUNTER — Other Ambulatory Visit: Payer: Self-pay | Admitting: Internal Medicine

## 2018-02-04 DIAGNOSIS — I10 Essential (primary) hypertension: Secondary | ICD-10-CM

## 2018-02-10 ENCOUNTER — Inpatient Hospital Stay: Payer: Medicare Other | Attending: Hematology and Oncology | Admitting: Hematology and Oncology

## 2018-02-10 ENCOUNTER — Telehealth: Payer: Self-pay | Admitting: Hematology and Oncology

## 2018-02-10 DIAGNOSIS — Z923 Personal history of irradiation: Secondary | ICD-10-CM | POA: Diagnosis not present

## 2018-02-10 DIAGNOSIS — C50511 Malignant neoplasm of lower-outer quadrant of right female breast: Secondary | ICD-10-CM | POA: Diagnosis not present

## 2018-02-10 DIAGNOSIS — Z79899 Other long term (current) drug therapy: Secondary | ICD-10-CM | POA: Diagnosis not present

## 2018-02-10 DIAGNOSIS — Z7982 Long term (current) use of aspirin: Secondary | ICD-10-CM | POA: Insufficient documentation

## 2018-02-10 DIAGNOSIS — Z853 Personal history of malignant neoplasm of breast: Secondary | ICD-10-CM | POA: Diagnosis not present

## 2018-02-10 DIAGNOSIS — Z171 Estrogen receptor negative status [ER-]: Secondary | ICD-10-CM

## 2018-02-10 NOTE — Telephone Encounter (Signed)
Gave patient AVs and calendar of upcoming May 2020 appointments.  °

## 2018-02-10 NOTE — Assessment & Plan Note (Signed)
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  Breast cancer surveillance: 1. Breast exam 02/10/2018 does not reveal any areas of concern for recurrent breast cancer. 2. Mammogram 09/28/2017 at [facility]: No mammographic evidence of malignancy; Density C  Return to clinic in 1 year for continued surveillance.

## 2018-02-10 NOTE — Progress Notes (Signed)
Patient Care Team: Jearld Fenton, NP as PCP - General (Internal Medicine) Nicholas Lose, MD as Consulting Physician (Hematology and Oncology) Excell Seltzer, MD as Consulting Physician (General Surgery) Eppie Gibson, MD as Attending Physician (Radiation Oncology) Holley Bouche, NP as Nurse Practitioner (Nurse Practitioner)  DIAGNOSIS:  Encounter Diagnosis  Name Primary?  . Malignant neoplasm of lower-outer quadrant of right breast of female, estrogen receptor negative ([place])     SUMMARY OF ONCOLOGIC HISTORY:   Breast cancer of lower-outer quadrant of right female breast ([place])   08/14/2014 Initial Diagnosis    Right breast high-grade invasive carcinoma spindle, epithelioid and squamous differentiation, ER 2%, PR 0%, HER-2 negative ratio 1.57, Ki-67 70%      08/21/2014 Breast MRI    Right breast enhancement at the site of the biopsy 1.2 x 1.2 x 1.4 cm enhancing mass, along lateral margin 1.8 cm hematoma      08/29/2014 Surgery    Right breast lumpectomy: High-grade invasive mammary carcinoma with squamous differentiation metaplastic carcinoma no LVI; margins negative, 2 SLN negative, grade 3, 1.5 cm, ER 0%, PR 0%, HER-2 negative ratio 1.19      10/01/2014 - 11/20/2014 Radiation Therapy    Right breast: Total of 50.4 Gy in 28 fractions.  Right breast boost: Total of 10 Gy in 5 fractions.       10/18/2014 Procedure    Genetic testing found a BRCA2 VUS called [id number], but was otherwise normal       CHIEF COMPLIANT: Surveillance of breast cancer  INTERVAL HISTORY: Miranda Mueller is a 77 year old with above-mentioned history of right breast cancer treated with lumpectomy and radiation therapy.  She is currently in surveillance.  She denies any lumps or nodules in the breast.  REVIEW OF SYSTEMS:   Constitutional: Denies fevers, chills or abnormal weight loss Eyes: Denies blurriness of vision Ears, nose, mouth, throat, and face: Denies mucositis or sore  throat Respiratory: Denies cough, dyspnea or wheezes Cardiovascular: Denies palpitation, chest discomfort Gastrointestinal:  Denies nausea, heartburn or change in bowel habits Skin: Denies abnormal skin rashes Lymphatics: Denies new lymphadenopathy or easy bruising Neurological:Denies numbness, tingling or new weaknesses Behavioral/Psych: Mood is stable, no new changes  Extremities: No lower extremity edema Breast:  denies any pain or lumps or nodules in either breasts All other systems were reviewed with the patient and are negative.  I have reviewed the past medical history, past surgical history, social history and family history with the patient and they are unchanged from previous note.  ALLERGIES:  has No Known Allergies.  MEDICATIONS:  Current Outpatient Medications  Medication Sig Dispense Refill  . aspirin 81 MG tablet Take 81 mg by mouth daily.    Marland Kitchen atorvastatin (LIPITOR) 10 MG tablet TAKE 1 TABLET BY MOUTH DAILY 90 tablet 2  . betamethasone dipropionate (DIPROLENE) 0.05 % cream Apply topically as needed.    . cholecalciferol (VITAMIN D) 1000 units tablet Take 1,000 Units by mouth daily.    . cycloSPORINE (RESTASIS) 0.05 % ophthalmic emulsion Place 1 drop into both eyes 2 (two) times daily.      Marland Kitchen esomeprazole (NEXIUM) 40 MG capsule Take 40 mg by mouth as needed.    . fluticasone (FLONASE) 50 MCG/ACT nasal spray USE TWO SPRAYS IN EACH NOSTRIL DAILY 16 g 3  . hydrochlorothiazide (HYDRODIURIL) 25 MG tablet TAKE 1 TABLET BY MOUTH DAILY. NEED TO SCHEDULE ANNUAL PHYSICAL FOR FURTHER REFILLS. 90 tablet 0  . Multiple Vitamins-Minerals (HAIR/SKIN/NAILS PO) Take 1 tablet  by mouth daily.    Marland Kitchen olmesartan (BENICAR) 20 MG tablet TAKE 1 TABLET BY MOUTH DAILY. NEED TO SCHEDULE ANNUAL PHYSICAL FOR FURTHER REFILLS. 90 tablet 0  . Potassium 99 MG TABS Take 1 tablet by mouth daily.       No current facility-administered medications for this visit.     PHYSICAL EXAMINATION: ECOG PERFORMANCE  STATUS: 0 - Asymptomatic  There were no vitals filed for this visit. There were no vitals filed for this visit.  GENERAL:alert, no distress and comfortable SKIN: skin color, texture, turgor are normal, no rashes or significant lesions EYES: normal, Conjunctiva are pink and non-injected, sclera clear OROPHARYNX:no exudate, no erythema and lips, buccal mucosa, and tongue normal  NECK: supple, thyroid normal size, non-tender, without nodularity LYMPH:  no palpable lymphadenopathy in the cervical, axillary or inguinal LUNGS: clear to auscultation and percussion with normal breathing effort HEART: regular rate & rhythm and no murmurs and no lower extremity edema ABDOMEN:abdomen soft, non-tender and normal bowel sounds MUSCULOSKELETAL:no cyanosis of digits and no clubbing  NEURO: alert & oriented x 3 with fluent speech, no focal motor/sensory deficits EXTREMITIES: No lower extremity edema BREAST: No palpable masses or nodules in either right or left breasts. No palpable axillary supraclavicular or infraclavicular adenopathy no breast tenderness or nipple discharge. (exam performed in the presence of a chaperone)  LABORATORY DATA:  I have reviewed the data as listed CMP Latest Ref Rng & Units 12/09/2017 11/19/2016 03/19/2016  Glucose 70 - 99 mg/dL 83 106(H) 102(H)  BUN 6 - 23 mg/dL 15 22 16   Creatinine 0.40 - 1.20 mg/dL 0.98 1.00 0.93  Sodium 135 - 145 mEq/L 139 139 138  Potassium 3.5 - 5.1 mEq/L 4.1 3.9 4.0  Chloride 96 - 112 mEq/L 101 102 100  CO2 19 - 32 mEq/L 29 32 31  Calcium 8.4 - 10.5 mg/dL 9.9 9.4 9.6  Total Protein 6.0 - 8.3 g/dL 7.5 7.6 7.5  Total Bilirubin 0.2 - 1.2 mg/dL 0.5 0.7 0.6  Alkaline Phos 39 - 117 U/L 48 47 51  AST 0 - 37 U/L 19 14 18   ALT 0 - 35 U/L 22 16 23     Lab Results  Component Value Date   WBC 7.3 12/09/2017   HGB 13.3 12/09/2017   HCT 39.5 12/09/2017   MCV 87.8 12/09/2017   PLT 335.0 12/09/2017   NEUTROABS 5.5 08/22/2014    ASSESSMENT & PLAN:  Breast  cancer of lower-outer quadrant of right female breast 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  Breast cancer surveillance: 1. Breast exam 02/10/2018 does not reveal any areas of concern for recurrent breast cancer. 2. Mammogram 09/28/2017 at [facility]: No mammographic evidence of malignancy; Density C  Return to clinic in 1 year for continued surveillance with long-term survivorship clinic with Mendel Ryder  No orders of the defined types were placed in this encounter.  The patient has a good understanding of the overall plan. she agrees with it. she will call with any problems that may develop before the next visit here.   Harriette Ohara, MD 02/10/18

## 2018-02-15 ENCOUNTER — Other Ambulatory Visit: Payer: Self-pay | Admitting: Internal Medicine

## 2018-02-15 DIAGNOSIS — I10 Essential (primary) hypertension: Secondary | ICD-10-CM

## 2018-05-31 ENCOUNTER — Other Ambulatory Visit: Payer: Self-pay | Admitting: Internal Medicine

## 2018-05-31 DIAGNOSIS — J309 Allergic rhinitis, unspecified: Secondary | ICD-10-CM

## 2018-06-16 ENCOUNTER — Ambulatory Visit (INDEPENDENT_AMBULATORY_CARE_PROVIDER_SITE_OTHER): Payer: Medicare Other | Admitting: Internal Medicine

## 2018-06-16 ENCOUNTER — Encounter: Payer: Self-pay | Admitting: Internal Medicine

## 2018-06-16 VITALS — BP 128/74 | HR 57 | Temp 97.9°F | Wt 267.0 lb

## 2018-06-16 DIAGNOSIS — M25511 Pain in right shoulder: Secondary | ICD-10-CM

## 2018-06-16 DIAGNOSIS — M542 Cervicalgia: Secondary | ICD-10-CM

## 2018-06-16 DIAGNOSIS — M79641 Pain in right hand: Secondary | ICD-10-CM | POA: Diagnosis not present

## 2018-06-16 MED ORDER — PREDNISONE 10 MG PO TABS: ORAL_TABLET | ORAL | 0 refills | Status: DC

## 2018-06-16 MED ORDER — ORPHENADRINE CITRATE ER 100 MG PO TB12: 100.0000 mg | ORAL_TABLET | Freq: Two times a day (BID) | ORAL | 0 refills | Status: DC

## 2018-06-16 NOTE — Patient Instructions (Signed)
Shoulder Exercises Ask your health care provider which exercises are safe for you. Do exercises exactly as told by your health care provider and adjust them as directed. It is normal to feel mild stretching, pulling, tightness, or discomfort as you do these exercises, but you should stop right away if you feel sudden pain or your pain gets worse.Do not begin these exercises until told by your health care provider. RANGE OF MOTION EXERCISES These exercises warm up your muscles and joints and improve the movement and flexibility of your shoulder. These exercises also help to relieve pain, numbness, and tingling. These exercises involve stretching your injured shoulder directly. Exercise A: Pendulum  1. Stand near a wall or a surface that you can hold onto for balance. 2. Bend at the waist and let your left / right arm hang straight down. Use your other arm to support you. Keep your back straight and do not lock your knees. 3. Relax your left / right arm and shoulder muscles, and move your hips and your trunk so your left / right arm swings freely. Your arm should swing because of the motion of your body, not because you are using your arm or shoulder muscles. 4. Keep moving your body so your arm swings in the following directions, as told by your health care provider: ? Side to side. ? Forward and backward. ? In clockwise and counterclockwise circles. 5. Continue each motion for __________ seconds, or for as long as told by your health care provider. 6. Slowly return to the starting position. Repeat __________ times. Complete this exercise __________ times a day. Exercise B:Flexion, Standing  1. Stand and hold a broomstick, a cane, or a similar object. Place your hands a little more than shoulder-width apart on the object. Your left / right hand should be palm-up, and your other hand should be palm-down. 2. Keep your elbow straight and keep your shoulder muscles relaxed. Push the stick down with  your healthy arm to raise your left / right arm in front of your body, and then over your head until you feel a stretch in your shoulder. ? Avoid shrugging your shoulder while you raise your arm. Keep your shoulder blade tucked down toward the middle of your back. 3. Hold for __________ seconds. 4. Slowly return to the starting position. Repeat __________ times. Complete this exercise __________ times a day. Exercise C: Abduction, Standing 1. Stand and hold a broomstick, a cane, or a similar object. Place your hands a little more than shoulder-width apart on the object. Your left / right hand should be palm-up, and your other hand should be palm-down. 2. While keeping your elbow straight and your shoulder muscles relaxed, push the stick across your body toward your left / right side. Raise your left / right arm to the side of your body and then over your head until you feel a stretch in your shoulder. ? Do not raise your arm above shoulder height, unless your health care provider tells you to do that. ? Avoid shrugging your shoulder while you raise your arm. Keep your shoulder blade tucked down toward the middle of your back. 3. Hold for __________ seconds. 4. Slowly return to the starting position. Repeat __________ times. Complete this exercise __________ times a day. Exercise D:Internal Rotation  1. Place your left / right hand behind your back, palm-up. 2. Use your other hand to dangle an exercise band, a towel, or a similar object over your shoulder. Grasp the band with   your left / right hand so you are holding onto both ends. 3. Gently pull up on the band until you feel a stretch in the front of your left / right shoulder. ? Avoid shrugging your shoulder while you raise your arm. Keep your shoulder blade tucked down toward the middle of your back. 4. Hold for __________ seconds. 5. Release the stretch by letting go of the band and lowering your hands. Repeat __________ times. Complete  this exercise __________ times a day. STRETCHING EXERCISES These exercises warm up your muscles and joints and improve the movement and flexibility of your shoulder. These exercises also help to relieve pain, numbness, and tingling. These exercises are done using your healthy shoulder to help stretch the muscles of your injured shoulder. Exercise E: Corner Stretch (External Rotation and Abduction)  1. Stand in a doorway with one of your feet slightly in front of the other. This is called a staggered stance. If you cannot reach your forearms to the door frame, stand facing a corner of a room. 2. Choose one of the following positions as told by your health care provider: ? Place your hands and forearms on the door frame above your head. ? Place your hands and forearms on the door frame at the height of your head. ? Place your hands on the door frame at the height of your elbows. 3. Slowly move your weight onto your front foot until you feel a stretch across your chest and in the front of your shoulders. Keep your head and chest upright and keep your abdominal muscles tight. 4. Hold for __________ seconds. 5. To release the stretch, shift your weight to your back foot. Repeat __________ times. Complete this stretch __________ times a day. Exercise F:Extension, Standing 1. Stand and hold a broomstick, a cane, or a similar object behind your back. ? Your hands should be a little wider than shoulder-width apart. ? Your palms should face away from your back. 2. Keeping your elbows straight and keeping your shoulder muscles relaxed, move the stick away from your body until you feel a stretch in your shoulder. ? Avoid shrugging your shoulders while you move the stick. Keep your shoulder blade tucked down toward the middle of your back. 3. Hold for __________ seconds. 4. Slowly return to the starting position. Repeat __________ times. Complete this exercise __________ times a day. STRENGTHENING  EXERCISES These exercises build strength and endurance in your shoulder. Endurance is the ability to use your muscles for a long time, even after they get tired. Exercise G:External Rotation  1. Sit in a stable chair without armrests. 2. Secure an exercise band at elbow height on your left / right side. 3. Place a soft object, such as a folded towel or a small pillow, between your left / right upper arm and your body to move your elbow a few inches away (about 10 cm) from your side. 4. Hold the end of the band so it is tight and there is no slack. 5. Keeping your elbow pressed against the soft object, move your left / right forearm out, away from your abdomen. Keep your body steady so only your forearm moves. 6. Hold for __________ seconds. 7. Slowly return to the starting position. Repeat __________ times. Complete this exercise __________ times a day. Exercise H:Shoulder Abduction  1. Sit in a stable chair without armrests, or stand. 2. Hold a __________ weight in your left / right hand, or hold an exercise band with both hands.   3. Start with your arms straight down and your left / right palm facing in, toward your body. 4. Slowly lift your left / right hand out to your side. Do not lift your hand above shoulder height unless your health care provider tells you that this is safe. ? Keep your arms straight. ? Avoid shrugging your shoulder while you do this movement. Keep your shoulder blade tucked down toward the middle of your back. 5. Hold for __________ seconds. 6. Slowly lower your arm, and return to the starting position. Repeat __________ times. Complete this exercise __________ times a day. Exercise I:Shoulder Extension 1. Sit in a stable chair without armrests, or stand. 2. Secure an exercise band to a stable object in front of you where it is at shoulder height. 3. Hold one end of the exercise band in each hand. Your palms should face each other. 4. Straighten your elbows and  lift your hands up to shoulder height. 5. Step back, away from the secured end of the exercise band, until the band is tight and there is no slack. 6. Squeeze your shoulder blades together as you pull your hands down to the sides of your thighs. Stop when your hands are straight down by your sides. Do not let your hands go behind your body. 7. Hold for __________ seconds. 8. Slowly return to the starting position. Repeat __________ times. Complete this exercise __________ times a day. Exercise J:Standing Shoulder Row 1. Sit in a stable chair without armrests, or stand. 2. Secure an exercise band to a stable object in front of you so it is at waist height. 3. Hold one end of the exercise band in each hand. Your palms should be in a thumbs-up position. 4. Bend each of your elbows to an "L" shape (about 90 degrees) and keep your upper arms at your sides. 5. Step back until the band is tight and there is no slack. 6. Slowly pull your elbows back behind you. 7. Hold for __________ seconds. 8. Slowly return to the starting position. Repeat __________ times. Complete this exercise __________ times a day. Exercise K:Shoulder Press-Ups  1. Sit in a stable chair that has armrests. Sit upright, with your feet flat on the floor. 2. Put your hands on the armrests so your elbows are bent and your fingers are pointing forward. Your hands should be about even with the sides of your body. 3. Push down on the armrests and use your arms to lift yourself off of the chair. Straighten your elbows and lift yourself up as much as you comfortably can. ? Move your shoulder blades down, and avoid letting your shoulders move up toward your ears. ? Keep your feet on the ground. As you get stronger, your feet should support less of your body weight as you lift yourself up. 4. Hold for __________ seconds. 5. Slowly lower yourself back into the chair. Repeat __________ times. Complete this exercise __________ times a  day. Exercise L: Wall Push-Ups  1. Stand so you are facing a stable wall. Your feet should be about one arm-length away from the wall. 2. Lean forward and place your palms on the wall at shoulder height. 3. Keep your feet flat on the floor as you bend your elbows and lean forward toward the wall. 4. Hold for __________ seconds. 5. Straighten your elbows to push yourself back to the starting position. Repeat __________ times. Complete this exercise __________ times a day. This information is not intended to replace advice   given to you by your health care provider. Make sure you discuss any questions you have with your health care provider. Document Released: 08/12/2005 Document Revised: 06/22/2016 Document Reviewed: 06/09/2015 Elsevier Interactive Patient Education  2018 [employer]  

## 2018-06-16 NOTE — Progress Notes (Signed)
Subjective:    Patient ID: Miranda Mueller, female    DOB: 20-May-1941, 77 y.o.   MRN: [id number]  HPI  Pt presents to the clinic today with c/o right arm pain. She reports this started 3 weeks. It started in the right shoulder and radiates into her right hand. She describes the pain as dull, achy and squeezing. She denies numbness, tingling or weakness. She reports tightness of her neck but denies neck pain or trauma to the neck, shoulder or right arm. She has tried Ibuprofen, Aleve, Tylenol, Blue Emu with minima relief.  She does have a history of right breast CA, with radiation therapy in 2015-2016.  Review of Systems      Past Medical History:  Diagnosis Date  . Bradycardia   . Breast cancer ([facility]) 08/29/14   Right Breast -High Grade Invasive Mammary  . Cataract    surgery  . GERD (gastroesophageal reflux disease)   . History of shingles   . Hx of radiation therapy 10/01/14- 11/20/14   right breast/50.4 Gy/28 fx; right breast boost/10 Gy/5 fx  . Hyperlipidemia   . Hypertension   . IBS (irritable bowel syndrome)   . Multifocal PVCs   . PVC (premature ventricular contraction)    Hx of  . Sleep apnea    no cpap per pt     Current Outpatient Medications  Medication Sig Dispense Refill  . aspirin 81 MG tablet Take 81 mg by mouth daily.    Marland Kitchen atorvastatin (LIPITOR) 10 MG tablet TAKE 1 TABLET BY MOUTH DAILY 90 tablet 2  . betamethasone dipropionate (DIPROLENE) 0.05 % cream Apply topically as needed.    . cholecalciferol (VITAMIN D) 1000 units tablet Take 1,000 Units by mouth daily.    . cycloSPORINE (RESTASIS) 0.05 % ophthalmic emulsion Place 1 drop into both eyes 2 (two) times daily.      Marland Kitchen esomeprazole (NEXIUM) 40 MG capsule Take 40 mg by mouth as needed.    . fluticasone (FLONASE) 50 MCG/ACT nasal spray SPRAY 2 SPRAYS INTO EACH NOSTRIL EVERY DAY 16 g 1  . hydrochlorothiazide (HYDRODIURIL) 25 MG tablet TAKE 1 TABLET BY MOUTH DAILY. NEED TO SCHEDULE ANNUAL PHYSICAL FOR FURTHER  REFILLS. 90 tablet 0  . Multiple Vitamins-Minerals (HAIR/SKIN/NAILS PO) Take 1 tablet by mouth daily.    Marland Kitchen olmesartan (BENICAR) 20 MG tablet Take 1 tablet (20 mg total) by mouth daily. 90 tablet 2  . Potassium 99 MG TABS Take 1 tablet by mouth daily.       No current facility-administered medications for this visit.     No Known Allergies  Family History  Problem Relation Age of Onset  . Hypertension Mother   . Heart disease Mother        CAD  . Breast cancer Mother 64  . Hypertension Sister   . Breast cancer Sister 91  . Aneurysm Sister   . Hypertension Brother   . Prostate cancer Brother 53  . Leukemia Maternal Uncle 38  . Heart disease Maternal Grandmother   . Leukemia Maternal Uncle 79  . Breast cancer Cousin        dx under 29  . Colon cancer Cousin        dx in her 78s  . Colon cancer Cousin   . Lymphoma Other 49       [employer]  . Colon polyps Daughter   . Stroke Father     Social History   Socioeconomic History  . Marital status: Married  Spouse name: Homer  . Number of children: 1  . Years of education: 38  . Highest education level: Not on file  Occupational History    Comment: retired  Scientific laboratory technician  . Financial resource strain: Not on file  . Food insecurity:    Worry: Not on file    Inability: Not on file  . Transportation needs:    Medical: Not on file    Non-medical: Not on file  Tobacco Use  . Smoking status: Never Smoker  . Smokeless tobacco: Never Used  Substance and Sexual Activity  . Alcohol use: No    Alcohol/week: 0.0 standard drinks  . Drug use: No  . Sexual activity: Never  Lifestyle  . Physical activity:    Days per week: Not on file    Minutes per session: Not on file  . Stress: Not on file  Relationships  . Social connections:    Talks on phone: Not on file    Gets together: Not on file    Attends religious service: Not on file    Active member of club or organization: Not on file    Attends meetings of clubs or  organizations: Not on file    Relationship status: Not on file  . Intimate partner violence:    Fear of current or ex partner: Not on file    Emotionally abused: Not on file    Physically abused: Not on file    Forced sexual activity: Not on file  Other Topics Concern  . Not on file  Social History Narrative   Consumes one glass of caffeine daily     Constitutional: Denies fever, malaise, fatigue, headache or abrupt weight changes.  Musculoskeletal: Pt reports right arm pain. Denies difficulty with gait, muscle pain or joint swelling.  Skin: Denies redness, rashes, lesions or ulcercations.  Neurological: Denies numbness, tingling or weakness or RUE.    No other specific complaints in a complete review of systems (except as listed in HPI above).  Objective:   Physical Exam   BP 128/74   Pulse (!) 57   Temp 97.9 F (36.6 C) (Oral)   Wt 267 lb (121.1 kg)   BMI 43.09 kg/m  Wt Readings from Last 3 Encounters:  06/16/18 267 lb (121.1 kg)  02/10/18 264 lb 4.8 oz (119.9 kg)  12/09/17 262 lb (118.8 kg)    General: Appears her stated age, obese in NAD. Skin: Warm, dry and intact. No rashes noted. Cardiovascular: Radial pulse 2+ bilaterally. Musculoskeletal: Normal flexion, extension and rotation of the cervical spine. Bony tenderness noted over the cervical spine. Tension noted in bilateral paraspinal muscles. Normal internal and external rotation of the right shoulder. Pain with palpation over the right AC joint, subacromial bursa, anterior biceps tendon. Normal flexion, extension and rotation of the right elbow. Pain with palpation over the lateral epicondyle.  Normal flexion, extension and rotation of the right wrist. Crepitus noted with ROM of wrist. Strength 4/5 BUE. Hand grips equal. Neurological: Alert and oriented. Negative Phanen's, Tinel's. Sensation intact to BUE.   BMET    Component Value Date/Time   NA 139 12/09/2017 1616   NA 138 08/22/2014 1237   K 4.1  12/09/2017 1616   K 3.8 08/22/2014 1237   CL 101 12/09/2017 1616   CO2 29 12/09/2017 1616   CO2 28 08/22/2014 1237   GLUCOSE 83 12/09/2017 1616   GLUCOSE 102 08/22/2014 1237   BUN 15 12/09/2017 1616   BUN 16.0 08/22/2014 1237  CREATININE 0.98 12/09/2017 1616   CREATININE 1.1 08/22/2014 1237   CALCIUM 9.9 12/09/2017 1616   CALCIUM 9.8 08/22/2014 1237   GFRNONAA 53.06 02/17/2010 1454   GFRAA 72 12/01/2007 1105    Lipid Panel     Component Value Date/Time   CHOL 139 12/09/2017 1616   TRIG 105.0 12/09/2017 1616   HDL 40.40 12/09/2017 1616   CHOLHDL 3 12/09/2017 1616   VLDL 21.0 12/09/2017 1616   LDLCALC 77 12/09/2017 1616    CBC    Component Value Date/Time   WBC 7.3 12/09/2017 1616   RBC 4.50 12/09/2017 1616   HGB 13.3 12/09/2017 1616   HGB 13.9 08/22/2014 1237   HCT 39.5 12/09/2017 1616   HCT 42.7 08/22/2014 1237   PLT 335.0 12/09/2017 1616   PLT 345 08/22/2014 1237   MCV 87.8 12/09/2017 1616   MCV 88.7 08/22/2014 1237   MCH 29.0 08/22/2014 1237   MCHC 33.7 12/09/2017 1616   RDW 14.0 12/09/2017 1616   RDW 13.8 08/22/2014 1237   LYMPHSABS 2.9 08/22/2014 1237   MONOABS 0.8 08/22/2014 1237   EOSABS 0.4 08/22/2014 1237   BASOSABS 0.1 08/22/2014 1237    Hgb A1C Lab Results  Component Value Date   HGBA1C 5.5 08/27/2015           Assessment & Plan:   Acute Neck Pain, Right Arm Pain:  Likely a combination of tendonitis, arthritis and muscle tension Encouraged heat and strecthing eRx for Pred Taper x 9 days- avoid OTC NSAID's eRx for Norflex 100 mg BID- sedation caution given If no improvement, will obtain xray cervical spine, right shoulder and right hand  Return precautions discussed Webb Silversmith, NP

## 2018-07-31 ENCOUNTER — Other Ambulatory Visit: Payer: Self-pay | Admitting: Internal Medicine

## 2018-07-31 DIAGNOSIS — I10 Essential (primary) hypertension: Secondary | ICD-10-CM

## 2018-08-08 ENCOUNTER — Encounter: Payer: Self-pay | Admitting: Gastroenterology

## 2018-08-08 DIAGNOSIS — H524 Presbyopia: Secondary | ICD-10-CM | POA: Diagnosis not present

## 2018-08-08 DIAGNOSIS — H04123 Dry eye syndrome of bilateral lacrimal glands: Secondary | ICD-10-CM | POA: Diagnosis not present

## 2018-09-03 ENCOUNTER — Other Ambulatory Visit: Payer: Self-pay | Admitting: Internal Medicine

## 2018-09-13 ENCOUNTER — Encounter: Payer: Self-pay | Admitting: Gastroenterology

## 2018-09-13 ENCOUNTER — Other Ambulatory Visit: Payer: Self-pay | Admitting: Internal Medicine

## 2018-09-13 DIAGNOSIS — I10 Essential (primary) hypertension: Secondary | ICD-10-CM

## 2018-09-14 ENCOUNTER — Other Ambulatory Visit: Payer: Self-pay | Admitting: Internal Medicine

## 2018-09-14 DIAGNOSIS — I10 Essential (primary) hypertension: Secondary | ICD-10-CM

## 2018-09-19 DIAGNOSIS — Z23 Encounter for immunization: Secondary | ICD-10-CM | POA: Diagnosis not present

## 2018-10-10 ENCOUNTER — Encounter: Payer: Self-pay | Admitting: Hematology and Oncology

## 2018-10-10 DIAGNOSIS — Z853 Personal history of malignant neoplasm of breast: Secondary | ICD-10-CM | POA: Diagnosis not present

## 2018-10-10 DIAGNOSIS — R922 Inconclusive mammogram: Secondary | ICD-10-CM | POA: Diagnosis not present

## 2018-10-18 NOTE — Progress Notes (Signed)
Miranda Mueller received in the mail.sent to scan.

## 2018-10-24 ENCOUNTER — Ambulatory Visit (AMBULATORY_SURGERY_CENTER): Payer: Self-pay | Admitting: *Deleted

## 2018-10-24 VITALS — Ht 66.0 in | Wt 269.0 lb

## 2018-10-24 DIAGNOSIS — Z86010 Personal history of colonic polyps: Secondary | ICD-10-CM

## 2018-10-24 MED ORDER — PEG 3350-KCL-NA BICARB-NACL 420 G PO SOLR: 4000.0000 mL | Freq: Once | ORAL | 0 refills | Status: AC

## 2018-10-24 NOTE — Progress Notes (Signed)
Patient denies any allergies to eggs or soy. Patient denies any problems with anesthesia/sedation. Patient denies any oxygen use at home. Patient denies taking any diet/weight loss medications or blood thinners. EMMI education offered, pt declined.  

## 2018-11-07 ENCOUNTER — Encounter: Payer: Self-pay | Admitting: Gastroenterology

## 2018-11-07 ENCOUNTER — Ambulatory Visit (AMBULATORY_SURGERY_CENTER): Payer: Medicare Other | Admitting: Gastroenterology

## 2018-11-07 VITALS — BP 117/53 | HR 64 | Temp 98.6°F | Resp 13 | Ht 66.0 in | Wt 269.0 lb

## 2018-11-07 DIAGNOSIS — Z86010 Personal history of colonic polyps: Secondary | ICD-10-CM | POA: Diagnosis not present

## 2018-11-07 DIAGNOSIS — D123 Benign neoplasm of transverse colon: Secondary | ICD-10-CM

## 2018-11-07 DIAGNOSIS — Z1211 Encounter for screening for malignant neoplasm of colon: Secondary | ICD-10-CM | POA: Diagnosis not present

## 2018-11-07 DIAGNOSIS — D122 Benign neoplasm of ascending colon: Secondary | ICD-10-CM

## 2018-11-07 MED ORDER — SODIUM CHLORIDE 0.9 % IV SOLN: 500.0000 mL | Freq: Once | INTRAVENOUS | Status: DC

## 2018-11-07 NOTE — Op Note (Signed)
[facility] Patient Name: Miranda Mueller Procedure Date: 11/07/2018 10:11 AM MRN: [id number] Endoscopist: Mauri Pole , MD Age: 78 Referring MD:  Date of Birth: 03/31/1941 Gender: Female Account #: [id number] Procedure:                Colonoscopy Indications:              High risk colon cancer surveillance: Personal                            history of multiple (3 or more) adenomas, Last                            colonoscopy: 2016 Medicines:                Monitored Anesthesia Care Procedure:                Pre-Anesthesia Assessment:                           - Prior to the procedure, a History and Physical                            was performed, and patient medications and                            allergies were reviewed. The patient's tolerance of                            previous anesthesia was also reviewed. The risks                            and benefits of the procedure and the sedation                            options and risks were discussed with the patient.                            All questions were answered, and informed consent                            was obtained. Prior Anticoagulants: The patient has                            taken no previous anticoagulant or antiplatelet                            agents. ASA Grade Assessment: III - A patient with                            severe systemic disease. After reviewing the risks                            and benefits, the patient was deemed in  satisfactory condition to undergo the procedure.                           After obtaining informed consent, the colonoscope                            was passed under direct vision. Throughout the                            procedure, the patient's blood pressure, pulse, and                            oxygen saturations were monitored continuously. The                            Colonoscope was introduced through the  anus and                            advanced to the the cecum, identified by                            appendiceal orifice and ileocecal valve. The                            colonoscopy was performed without difficulty. The                            patient tolerated the procedure well. The quality                            of the bowel preparation was excellent. The                            ileocecal valve, appendiceal orifice, and rectum                            were photographed. Scope In: 10:14:46 AM Scope Out: 10:36:58 AM Scope Withdrawal Time: 0 hours 14 minutes 19 seconds  Total Procedure Duration: 0 hours 22 minutes 12 seconds  Findings:                 The perianal and digital rectal examinations were                            normal.                           A 2 mm polyp was found in the ascending colon. The                            polyp was sessile. The polyp was removed with a                            cold biopsy forceps. Resection and retrieval were  complete.                           Two sessile polyps were found in the transverse                            colon. The polyps were 4 to 6 mm in size. These                            polyps were removed with a cold snare. Resection                            and retrieval were complete.                           Multiple small and large-mouthed diverticula were                            found in the sigmoid colon, descending colon,                            transverse colon and ascending colon.                           Non-bleeding internal hemorrhoids were found during                            retroflexion. The hemorrhoids were small. Complications:            No immediate complications. Estimated Blood Loss:     Estimated blood loss was minimal. Impression:               - One 2 mm polyp in the ascending colon, removed                            with a cold biopsy forceps.  Resected and retrieved.                           - Two 4 to 6 mm polyps in the transverse colon,                            removed with a cold snare. Resected and retrieved.                           - Moderate diverticulosis in the sigmoid colon, in                            the descending colon, in the transverse colon and                            in the ascending colon.                           - Non-bleeding internal hemorrhoids. Recommendation:           -  Patient has a contact number available for                            emergencies. The signs and symptoms of potential                            delayed complications were discussed with the                            patient. Return to normal activities tomorrow.                            Written discharge instructions were provided to the                            patient.                           - Resume previous diet.                           - Continue present medications.                           - Await pathology results.                           - No repeat colonoscopy due to age. Mauri Pole, MD 11/07/2018 10:43:27 AM This report has been signed electronically.

## 2018-11-07 NOTE — Progress Notes (Signed)
PT taken to PACU. Monitors in place. VSS. Report given to RN. 

## 2018-11-07 NOTE — Progress Notes (Signed)
Called to room to assist during endoscopic procedure.  Patient ID and intended procedure confirmed with present staff. Received instructions for my participation in the procedure from the performing physician.  

## 2018-11-07 NOTE — Progress Notes (Signed)
Pt's states no medical or surgical changes since previsit or office visit. 

## 2018-11-07 NOTE — Patient Instructions (Addendum)
Handouts given on polyps, diverticulosis and hemorrhoids. TAKE BENEFIBER 1 TEASPOON THREE TIMES DAILY!   YOU HAD AN ENDOSCOPIC PROCEDURE TODAY AT [facility]:   Refer to the procedure report that was given to you for any specific questions about what was found during the examination.  If the procedure report does not answer your questions, please call your gastroenterologist to clarify.  If you requested that your care partner not be given the details of your procedure findings, then the procedure report has been included in a sealed envelope for you to review at your convenience later.  YOU SHOULD EXPECT: Some feelings of bloating in the abdomen. Passage of more gas than usual.  Walking can help get rid of the air that was put into your GI tract during the procedure and reduce the bloating. If you had a lower endoscopy (such as a colonoscopy or flexible sigmoidoscopy) you may notice spotting of blood in your stool or on the toilet paper. If you underwent a bowel prep for your procedure, you may not have a normal bowel movement for a few days.  Please Note:  You might notice some irritation and congestion in your nose or some drainage.  This is from the oxygen used during your procedure.  There is no need for concern and it should clear up in a day or so.  SYMPTOMS TO REPORT IMMEDIATELY:   Following lower endoscopy (colonoscopy or flexible sigmoidoscopy):  Excessive amounts of blood in the stool  Significant tenderness or worsening of abdominal pains  Swelling of the abdomen that is new, acute  Fever of 100F or higher   For urgent or emergent issues, a gastroenterologist can be reached at any hour by calling [phone].   DIET:  We do recommend a small meal at first, but then you may proceed to your regular diet.  Drink plenty of fluids but you should avoid alcoholic beverages for 24 hours.  ACTIVITY:  You should plan to take it easy for the rest of today and you should  NOT DRIVE or use heavy machinery until tomorrow (because of the sedation medicines used during the test).    FOLLOW UP: Our staff will call the number listed on your records the next business day following your procedure to check on you and address any questions or concerns that you may have regarding the information given to you following your procedure. If we do not reach you, we will leave a message.  However, if you are feeling well and you are not experiencing any problems, there is no need to return our call.  We will assume that you have returned to your regular daily activities without incident.  If any biopsies were taken you will be contacted by phone or by letter within the next 1-3 weeks.  Please call us at [phone] if you have not heard about the biopsies in 3 weeks.    SIGNATURES/CONFIDENTIALITY: You and/or your care partner have signed paperwork which will be entered into your electronic medical record.  These signatures attest to the fact that that the information above on your After Visit Summary has been reviewed and is understood.  Full responsibility of the confidentiality of this discharge information lies with you and/or your care-partner.

## 2018-11-08 ENCOUNTER — Telehealth: Payer: Self-pay

## 2018-11-08 NOTE — Telephone Encounter (Signed)
  Follow up Call-  Call back number 11/07/2018  Post procedure Call Back phone  # [phone]  Some recent data might be hidden     Patient questions:  Do you have a fever, pain , or abdominal swelling? No. Pain Score  0 *  Have you tolerated food without any problems? Yes.    Have you been able to return to your normal activities? Yes.    Do you have any questions about your discharge instructions: Diet   No. Medications  No. Follow up visit  No.  Do you have questions or concerns about your Care? No.  Actions: * If pain score is 4 or above: No action needed, pain <4.

## 2018-11-11 ENCOUNTER — Encounter: Payer: Self-pay | Admitting: Gastroenterology

## 2018-11-25 ENCOUNTER — Ambulatory Visit (INDEPENDENT_AMBULATORY_CARE_PROVIDER_SITE_OTHER): Payer: Medicare Other | Admitting: Internal Medicine

## 2018-11-25 VITALS — BP 128/84 | HR 71 | Temp 97.7°F | Wt 269.0 lb

## 2018-11-25 DIAGNOSIS — M5442 Lumbago with sciatica, left side: Secondary | ICD-10-CM | POA: Diagnosis not present

## 2018-11-25 MED ORDER — METHOCARBAMOL 500 MG PO TABS: 250.0000 mg | ORAL_TABLET | Freq: Four times a day (QID) | ORAL | 0 refills | Status: DC

## 2018-11-25 MED ORDER — PREDNISONE 10 MG PO TABS: ORAL_TABLET | ORAL | 0 refills | Status: DC

## 2018-11-25 NOTE — Patient Instructions (Signed)
Sciatica    Sciatica is pain, numbness, weakness, or tingling along your sciatic nerve. The sciatic nerve starts in the lower back and goes down the back of each leg. Sciatica happens when this nerve is pinched or has pressure put on it. Sciatica usually goes away on its own or with treatment. Sometimes, sciatica may keep coming back (recur).  Follow these instructions at home:  Medicines  · Take over-the-counter and prescription medicines only as told by your doctor.  · Do not drive or use heavy machinery while taking prescription pain medicine.  Managing pain  · If directed, put ice on the affected area.  ? Put ice in a plastic bag.  ? Place a towel between your skin and the bag.  ? Leave the ice on for 20 minutes, 2-3 times a day.  · After icing, apply heat to the affected area before you exercise or as often as told by your doctor. Use the heat source that your doctor tells you to use, such as a moist heat pack or a heating pad.  ? Place a towel between your skin and the heat source.  ? Leave the heat on for 20-30 minutes.  ? Remove the heat if your skin turns bright red. This is especially important if you are unable to feel pain, heat, or cold. You may have a greater risk of getting burned.  Activity  · Return to your normal activities as told by your doctor. Ask your doctor what activities are safe for you.  ? Avoid activities that make your sciatica worse.  · Take short rests during the day. Rest in a lying or standing position. This is usually better than sitting to rest.  ? When you rest for a long time, do some physical activity or stretching between periods of rest.  ? Avoid sitting for a long time without moving. Get up and move around at least one time each hour.  · Exercise and stretch regularly, as told by your doctor.  · Do not lift anything that is heavier than 10 lb (4.5 kg) while you have symptoms of sciatica.  ? Avoid lifting heavy things even when you do not have symptoms.  ? Avoid lifting  heavy things over and over.  · When you lift objects, always lift in a way that is safe for your body. To do this, you should:  ? Bend your knees.  ? Keep the object close to your body.  ? Avoid twisting.  General instructions  · Use good posture.  ? Avoid leaning forward when you are sitting.  ? Avoid hunching over when you are standing.  · Stay at a healthy weight.  · Wear comfortable shoes that support your feet. Avoid wearing high heels.  · Avoid sleeping on a mattress that is too soft or too hard. You might have less pain if you sleep on a mattress that is firm enough to support your back.  · Keep all follow-up visits as told by your doctor. This is important.  Contact a doctor if:  · You have pain that:  ? Wakes you up when you are sleeping.  ? Gets worse when you lie down.  ? Is worse than the pain you have had in the past.  ? Lasts longer than 4 weeks.  · You lose weight for without trying.  Get help right away if:  · You cannot control when you pee (urinate) or poop (have a bowel movement).  · You   have weakness in any of these areas and it gets worse.  ? Lower back.  ? Lower belly (pelvis).  ? Butt (buttocks).  ? Legs.  · You have redness or swelling of your back.  · You have a burning feeling when you pee.  This information is not intended to replace advice given to you by your health care provider. Make sure you discuss any questions you have with your health care provider.  Document Released: 07/07/2008 Document Revised: 03/05/2016 Document Reviewed: 06/07/2015  Elsevier Interactive Patient Education © 2019 [employer]

## 2018-11-25 NOTE — Progress Notes (Signed)
Subjective:    Patient ID: Miranda Mueller, female    DOB: 1940/12/03, 78 y.o.   MRN: [id number]  HPI  Patient presents to the clinic today with complaint of low back pain.  She reports this started 5 days ago.  She describes the pain as achy with intermittent sharp pains that radiate down the left leg.  She does have some associated numbness and tingling.  The pain is worse with walking and better with lying down.  She denies any injury area.  She denies any issues with bowel or bladder.  She has tried Tylenol and Aleve with minimal relief.  Review of Systems  Past Medical History:  Diagnosis Date  . Bradycardia   . Breast cancer ([place]) 08/29/14   Right Breast -High Grade Invasive Mammary  . Cataract    surgery  . GERD (gastroesophageal reflux disease)   . History of shingles   . Hx of radiation therapy 10/01/14- 11/20/14   right breast/50.4 Gy/28 fx; right breast boost/10 Gy/5 fx  . Hyperlipidemia   . Hypertension   . IBS (irritable bowel syndrome)   . Multifocal PVCs   . PVC (premature ventricular contraction)    Hx of  . Sleep apnea    no cpap per pt     Current Outpatient Medications  Medication Sig Dispense Refill  . acetaminophen (TYLENOL) 500 MG tablet Take 500 mg by mouth every 6 (six) hours as needed.    Marland Kitchen aspirin 81 MG tablet Take 81 mg by mouth daily.    Marland Kitchen atorvastatin (LIPITOR) 10 MG tablet TAKE 1 TABLET BY MOUTH DAILY 90 tablet 0  . betamethasone dipropionate (DIPROLENE) 0.05 % cream Apply topically as needed.    . cycloSPORINE (RESTASIS) 0.05 % ophthalmic emulsion Place 1 drop into both eyes 2 (two) times daily.      Marland Kitchen esomeprazole (NEXIUM) 40 MG capsule Take 40 mg by mouth as needed.    . hydrochlorothiazide (HYDRODIURIL) 25 MG tablet Take 1 tablet (25 mg total) by mouth daily. MUST SCHEDULE ANNUAL PHYSICAL FOR REFILLS 90 tablet 0  . Multiple Vitamins-Minerals (HAIR/SKIN/NAILS PO) Take 1 tablet by mouth daily.    Marland Kitchen olmesartan (BENICAR) 20 MG tablet Take 1 tablet  (20 mg total) by mouth daily. 90 tablet 2  . Potassium 99 MG TABS Take 1 tablet by mouth daily.      . methocarbamol (ROBAXIN) 500 MG tablet Take 0.5 tablets (250 mg total) by mouth 4 (four) times daily. 20 tablet 0  . predniSONE (DELTASONE) 10 MG tablet Take 3 tabs on days 1-3, take 2 tabs on days 4-6, take 1 tab on days 7-9 18 tablet 0   No current facility-administered medications for this visit.     No Known Allergies  Family History  Problem Relation Age of Onset  . Hypertension Mother   . Heart disease Mother        CAD  . Breast cancer Mother 36  . Hypertension Sister   . Breast cancer Sister 22  . Aneurysm Sister   . Hypertension Brother   . Prostate cancer Brother 41  . Leukemia Maternal Uncle 21  . Heart disease Maternal Grandmother   . Leukemia Maternal Uncle 54  . Breast cancer Cousin        dx under 45  . Colon cancer Cousin        dx in her 79s  . Colon cancer Cousin   . Lymphoma Other 49  [employer]  . Colon polyps Daughter   . Stroke Father   . Esophageal cancer Neg Hx   . Stomach cancer Neg Hx   . Rectal cancer Neg Hx     Social History   Socioeconomic History  . Marital status: Married    Spouse name: Homer  . Number of children: 1  . Years of education: 69  . Highest education level: Not on file  Occupational History    Comment: retired  Scientific laboratory technician  . Financial resource strain: Not on file  . Food insecurity:    Worry: Not on file    Inability: Not on file  . Transportation needs:    Medical: Not on file    Non-medical: Not on file  Tobacco Use  . Smoking status: Never Smoker  . Smokeless tobacco: Never Used  Substance and Sexual Activity  . Alcohol use: No    Alcohol/week: 0.0 standard drinks  . Drug use: No  . Sexual activity: Never  Lifestyle  . Physical activity:    Days per week: Not on file    Minutes per session: Not on file  . Stress: Not on file  Relationships  . Social connections:    Talks on phone: Not on file     Gets together: Not on file    Attends religious service: Not on file    Active member of club or organization: Not on file    Attends meetings of clubs or organizations: Not on file    Relationship status: Not on file  . Intimate partner violence:    Fear of current or ex partner: Not on file    Emotionally abused: Not on file    Physically abused: Not on file    Forced sexual activity: Not on file  Other Topics Concern  . Not on file  Social History Narrative   Consumes one glass of caffeine daily     Constitutional: Denies fever, malaise, fatigue, headache or abrupt weight changes.  Respiratory: Denies difficulty breathing, shortness of breath, cough or sputum production.   Cardiovascular: Denies chest pain, chest tightness, palpitations or swelling in the hands or feet.  Gastrointestinal: Denies abdominal pain, bloating, constipation, diarrhea or blood in the stool.  GU: Denies urgency, frequency, pain with urination, burning sensation, blood in urine, odor or discharge. Musculoskeletal: Pt reports low back pain and left leg pain. Denies decrease in range of motion, difficulty with gait,  or joint swelling.  Skin: Denies redness, rashes, lesions or ulcercations.  Neurological: Pt reports numbness and tingling in left leg. Denies dizziness, difficulty with memory, difficulty with speech or problems with balance and coordination.    No other specific complaints in a complete review of systems (except as listed in HPI above).     Objective:   Physical Exam  BP 128/84   Pulse 71   Temp 97.7 F (36.5 C) (Oral)   Wt 269 lb (122 kg)   BMI 43.42 kg/m  Wt Readings from Last 3 Encounters:  11/25/18 269 lb (122 kg)  11/07/18 269 lb (122 kg)  10/24/18 269 lb (122 kg)    General: Appears her stated age, obese, in NAD. Cardiovascular: Normal rate and rhythm.  Pulmonary/Chest: Normal effort and positive vesicular breath sounds. No respiratory distress. No wheezes, rales or ronchi  noted.  Musculoskeletal: Decreased flexion and extension secondary to pain. Normal rotation. Bony tenderness noted over the lumbar spine. Normal internal and external rotation of the left hip. No pain with palpation  of the left hip. Strength 4/5 LLE, 5/5 RLE. Limps with normal gait. Neurological: Alert and oriented. Positive SLR on the left.   BMET    Component Value Date/Time   NA 139 12/09/2017 1616   NA 138 08/22/2014 1237   K 4.1 12/09/2017 1616   K 3.8 08/22/2014 1237   CL 101 12/09/2017 1616   CO2 29 12/09/2017 1616   CO2 28 08/22/2014 1237   GLUCOSE 83 12/09/2017 1616   GLUCOSE 102 08/22/2014 1237   BUN 15 12/09/2017 1616   BUN 16.0 08/22/2014 1237   CREATININE 0.98 12/09/2017 1616   CREATININE 1.1 08/22/2014 1237   CALCIUM 9.9 12/09/2017 1616   CALCIUM 9.8 08/22/2014 1237   GFRNONAA 53.06 02/17/2010 1454   GFRAA 72 12/01/2007 1105    Lipid Panel     Component Value Date/Time   CHOL 139 12/09/2017 1616   TRIG 105.0 12/09/2017 1616   HDL 40.40 12/09/2017 1616   CHOLHDL 3 12/09/2017 1616   VLDL 21.0 12/09/2017 1616   LDLCALC 77 12/09/2017 1616    CBC    Component Value Date/Time   WBC 7.3 12/09/2017 1616   RBC 4.50 12/09/2017 1616   HGB 13.3 12/09/2017 1616   HGB 13.9 08/22/2014 1237   HCT 39.5 12/09/2017 1616   HCT 42.7 08/22/2014 1237   PLT 335.0 12/09/2017 1616   PLT 345 08/22/2014 1237   MCV 87.8 12/09/2017 1616   MCV 88.7 08/22/2014 1237   MCH 29.0 08/22/2014 1237   MCHC 33.7 12/09/2017 1616   RDW 14.0 12/09/2017 1616   RDW 13.8 08/22/2014 1237   LYMPHSABS 2.9 08/22/2014 1237   MONOABS 0.8 08/22/2014 1237   EOSABS 0.4 08/22/2014 1237   BASOSABS 0.1 08/22/2014 1237    Hgb A1C Lab Results  Component Value Date   HGBA1C 5.5 08/27/2015           Assessment & Plan:   Acute Midline Low Back Pain with Left Sided Sciatica:  eRx for Pred Taper x 9 days- avoid OTC NSAID's eRx for Methocarbamol 250 mg PO QHS- sedation caution given Heat or  ice may be helpful Stretching exercises given Consider xray vs PT if worse  Return precautions discussed Webb Silversmith, NP

## 2018-11-26 ENCOUNTER — Other Ambulatory Visit: Payer: Self-pay | Admitting: Internal Medicine

## 2018-11-30 ENCOUNTER — Encounter: Payer: Self-pay | Admitting: Internal Medicine

## 2018-12-04 ENCOUNTER — Other Ambulatory Visit: Payer: Self-pay | Admitting: Internal Medicine

## 2018-12-04 DIAGNOSIS — I10 Essential (primary) hypertension: Secondary | ICD-10-CM

## 2018-12-09 ENCOUNTER — Telehealth: Payer: Self-pay | Admitting: Internal Medicine

## 2018-12-09 NOTE — Telephone Encounter (Signed)
Pt came in office 2wks ago for leg pain and is still having same problem. Pt stated she finished her predinose and want to know is she need some more. Please advise pt.

## 2018-12-09 NOTE — Telephone Encounter (Signed)
eRx for Pred Taper x 9 days- avoid OTC NSAID's eRx for Methocarbamol 250 mg PO QHS- sedation caution given Heat or ice may be helpful Stretching exercises given Consider xray vs PT if worse

## 2018-12-09 NOTE — Telephone Encounter (Signed)
She needs to make an appt for reevaluation.

## 2018-12-12 ENCOUNTER — Ambulatory Visit: Payer: Medicare Other | Admitting: Internal Medicine

## 2018-12-12 DIAGNOSIS — M9905 Segmental and somatic dysfunction of pelvic region: Secondary | ICD-10-CM | POA: Diagnosis not present

## 2018-12-12 DIAGNOSIS — M9902 Segmental and somatic dysfunction of thoracic region: Secondary | ICD-10-CM | POA: Diagnosis not present

## 2018-12-12 DIAGNOSIS — M47814 Spondylosis without myelopathy or radiculopathy, thoracic region: Secondary | ICD-10-CM | POA: Diagnosis not present

## 2018-12-12 DIAGNOSIS — M9903 Segmental and somatic dysfunction of lumbar region: Secondary | ICD-10-CM | POA: Diagnosis not present

## 2018-12-12 DIAGNOSIS — M9904 Segmental and somatic dysfunction of sacral region: Secondary | ICD-10-CM | POA: Diagnosis not present

## 2018-12-12 DIAGNOSIS — M47817 Spondylosis without myelopathy or radiculopathy, lumbosacral region: Secondary | ICD-10-CM | POA: Diagnosis not present

## 2018-12-12 DIAGNOSIS — M5136 Other intervertebral disc degeneration, lumbar region: Secondary | ICD-10-CM | POA: Diagnosis not present

## 2018-12-12 DIAGNOSIS — M5137 Other intervertebral disc degeneration, lumbosacral region: Secondary | ICD-10-CM | POA: Diagnosis not present

## 2018-12-13 DIAGNOSIS — M9905 Segmental and somatic dysfunction of pelvic region: Secondary | ICD-10-CM | POA: Diagnosis not present

## 2018-12-13 DIAGNOSIS — M5137 Other intervertebral disc degeneration, lumbosacral region: Secondary | ICD-10-CM | POA: Diagnosis not present

## 2018-12-13 DIAGNOSIS — M47814 Spondylosis without myelopathy or radiculopathy, thoracic region: Secondary | ICD-10-CM | POA: Diagnosis not present

## 2018-12-13 DIAGNOSIS — M9902 Segmental and somatic dysfunction of thoracic region: Secondary | ICD-10-CM | POA: Diagnosis not present

## 2018-12-13 DIAGNOSIS — M9903 Segmental and somatic dysfunction of lumbar region: Secondary | ICD-10-CM | POA: Diagnosis not present

## 2018-12-13 DIAGNOSIS — M5136 Other intervertebral disc degeneration, lumbar region: Secondary | ICD-10-CM | POA: Diagnosis not present

## 2018-12-13 DIAGNOSIS — M9904 Segmental and somatic dysfunction of sacral region: Secondary | ICD-10-CM | POA: Diagnosis not present

## 2018-12-13 DIAGNOSIS — M47817 Spondylosis without myelopathy or radiculopathy, lumbosacral region: Secondary | ICD-10-CM | POA: Diagnosis not present

## 2018-12-14 NOTE — Telephone Encounter (Signed)
Pt scheduled a f/u but cancelled  pt called @ 9:22 stating her leg is better

## 2018-12-15 DIAGNOSIS — M47817 Spondylosis without myelopathy or radiculopathy, lumbosacral region: Secondary | ICD-10-CM | POA: Diagnosis not present

## 2018-12-15 DIAGNOSIS — M9902 Segmental and somatic dysfunction of thoracic region: Secondary | ICD-10-CM | POA: Diagnosis not present

## 2018-12-15 DIAGNOSIS — M9903 Segmental and somatic dysfunction of lumbar region: Secondary | ICD-10-CM | POA: Diagnosis not present

## 2018-12-15 DIAGNOSIS — M47814 Spondylosis without myelopathy or radiculopathy, thoracic region: Secondary | ICD-10-CM | POA: Diagnosis not present

## 2018-12-15 DIAGNOSIS — M5136 Other intervertebral disc degeneration, lumbar region: Secondary | ICD-10-CM | POA: Diagnosis not present

## 2018-12-15 DIAGNOSIS — M9905 Segmental and somatic dysfunction of pelvic region: Secondary | ICD-10-CM | POA: Diagnosis not present

## 2018-12-15 DIAGNOSIS — M9904 Segmental and somatic dysfunction of sacral region: Secondary | ICD-10-CM | POA: Diagnosis not present

## 2018-12-15 DIAGNOSIS — M5137 Other intervertebral disc degeneration, lumbosacral region: Secondary | ICD-10-CM | POA: Diagnosis not present

## 2018-12-20 DIAGNOSIS — M9903 Segmental and somatic dysfunction of lumbar region: Secondary | ICD-10-CM | POA: Diagnosis not present

## 2018-12-20 DIAGNOSIS — M47814 Spondylosis without myelopathy or radiculopathy, thoracic region: Secondary | ICD-10-CM | POA: Diagnosis not present

## 2018-12-20 DIAGNOSIS — M5137 Other intervertebral disc degeneration, lumbosacral region: Secondary | ICD-10-CM | POA: Diagnosis not present

## 2018-12-20 DIAGNOSIS — M9904 Segmental and somatic dysfunction of sacral region: Secondary | ICD-10-CM | POA: Diagnosis not present

## 2018-12-20 DIAGNOSIS — M5136 Other intervertebral disc degeneration, lumbar region: Secondary | ICD-10-CM | POA: Diagnosis not present

## 2018-12-20 DIAGNOSIS — M9902 Segmental and somatic dysfunction of thoracic region: Secondary | ICD-10-CM | POA: Diagnosis not present

## 2018-12-20 DIAGNOSIS — M9905 Segmental and somatic dysfunction of pelvic region: Secondary | ICD-10-CM | POA: Diagnosis not present

## 2018-12-20 DIAGNOSIS — M47817 Spondylosis without myelopathy or radiculopathy, lumbosacral region: Secondary | ICD-10-CM | POA: Diagnosis not present

## 2018-12-22 DIAGNOSIS — M5136 Other intervertebral disc degeneration, lumbar region: Secondary | ICD-10-CM | POA: Diagnosis not present

## 2018-12-22 DIAGNOSIS — M47814 Spondylosis without myelopathy or radiculopathy, thoracic region: Secondary | ICD-10-CM | POA: Diagnosis not present

## 2018-12-22 DIAGNOSIS — M9905 Segmental and somatic dysfunction of pelvic region: Secondary | ICD-10-CM | POA: Diagnosis not present

## 2018-12-22 DIAGNOSIS — M9902 Segmental and somatic dysfunction of thoracic region: Secondary | ICD-10-CM | POA: Diagnosis not present

## 2018-12-22 DIAGNOSIS — M9903 Segmental and somatic dysfunction of lumbar region: Secondary | ICD-10-CM | POA: Diagnosis not present

## 2018-12-22 DIAGNOSIS — M5137 Other intervertebral disc degeneration, lumbosacral region: Secondary | ICD-10-CM | POA: Diagnosis not present

## 2018-12-22 DIAGNOSIS — M47817 Spondylosis without myelopathy or radiculopathy, lumbosacral region: Secondary | ICD-10-CM | POA: Diagnosis not present

## 2018-12-22 DIAGNOSIS — M9904 Segmental and somatic dysfunction of sacral region: Secondary | ICD-10-CM | POA: Diagnosis not present

## 2019-01-17 ENCOUNTER — Other Ambulatory Visit: Payer: Self-pay | Admitting: Internal Medicine

## 2019-01-17 DIAGNOSIS — I10 Essential (primary) hypertension: Secondary | ICD-10-CM

## 2019-01-25 ENCOUNTER — Encounter: Payer: Medicare Other | Admitting: Internal Medicine

## 2019-02-01 ENCOUNTER — Telehealth: Payer: Self-pay | Admitting: Adult Health

## 2019-02-01 NOTE — Telephone Encounter (Signed)
Returning patient's phone call, patient wanted to cancel appointment but I explained to the patient this will be a Webex visit instead. Patient will keep the appointment but would prefer this to be a telephone visit instead.

## 2019-02-13 ENCOUNTER — Encounter: Payer: Self-pay | Admitting: Adult Health

## 2019-02-13 ENCOUNTER — Inpatient Hospital Stay: Payer: Medicare Other | Attending: Adult Health | Admitting: Adult Health

## 2019-02-13 DIAGNOSIS — Z171 Estrogen receptor negative status [ER-]: Secondary | ICD-10-CM | POA: Diagnosis not present

## 2019-02-13 DIAGNOSIS — C50511 Malignant neoplasm of lower-outer quadrant of right female breast: Secondary | ICD-10-CM

## 2019-02-13 NOTE — Progress Notes (Signed)
SURVIVORSHIP VIRTUAL VISIT:  I connected with Miranda Mueller on 02/13/19 at  9:00 AM EDT by telephone and verified that I am speaking with the correct person using two identifiers.   I discussed the limitations, risks, security and privacy concerns of performing an evaluation and management service by telephone and the availability of in person appointments. I also discussed with the patient that there may be a patient responsible charge related to this service. The patient expressed understanding and agreed to proceed.   REASON FOR VISIT:  Routine follow-up for history of breast cancer.   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  Right breast boost: Total of 10 Gy in 5 fractions.     10/18/2014 Procedure    Genetic testing found a BRCA2 VUS called [id number], but was otherwise normal      INTERVAL HISTORY:  Miranda Mueller presents to the [facility] today for routine follow-up for her history of breast cancer.  Overall, she reports feeling quite well. She has had no breast changes since her last visit with [place].  She also has had no medical changes.  Since her last visit, she underwent mammogram in 09/2018 that showed no evidence of malignancy and breast density category C.  She is due  for repeat mammogram in 6 months.    Miranda Mueller is seeing her PCP in 03/2019 and this will be her annual exam.  She has her skin checked during this time.  Her last colonoscopy was in 2020 and she was told at that time she did not need any further colonoscopies.   For exercise Miranda Mueller walks, however she has back pain and sciatica.  She was seeing the chiropractor which was helping, however with the COVID restrictions, she has not been able to see one.  She has been doing exercises and stretches to help.    Miranda Mueller has some scalp itching and wants to know what dermatologist I would recommend for this issue.      REVIEW OF SYSTEMS:  Review of Systems  Constitutional: Negative for appetite change, chills, fatigue and unexpected weight change.  HENT:   Negative for hearing loss, lump/mass, mouth sores, sore throat and trouble swallowing.   Eyes: Negative for eye problems and icterus.  Respiratory: Negative for chest tightness, cough and shortness of breath.   Cardiovascular: Negative for chest pain, leg swelling and palpitations.  Gastrointestinal: Negative for abdominal distention, abdominal pain, constipation, diarrhea, nausea and vomiting.  Endocrine: Negative for hot flashes.  Genitourinary: Negative for difficulty urinating.   Musculoskeletal: Negative for arthralgias.  Skin: Negative for itching and rash.  Neurological: Negative for dizziness, extremity weakness, headaches and numbness.  Hematological: Negative for adenopathy. Does not bruise/bleed easily.  Psychiatric/Behavioral: Negative for depression. The patient is not nervous/anxious.  Breast: Denies any new nodularity, masses, tenderness, nipple changes, or nipple discharge.      PAST MEDICAL/SURGICAL HISTORY:  Past Medical History:  Diagnosis Date   Bradycardia    Breast cancer ([place]) 08/29/14   Right Breast -High Grade Invasive Mammary   Cataract    surgery   GERD (gastroesophageal reflux disease)    History of shingles      Hx of radiation therapy 10/01/14- 11/20/14   right breast/50.4 Gy/28 fx; right breast boost/10 Gy/5 fx   Hyperlipidemia    Hypertension    IBS (irritable bowel syndrome)    Multifocal PVCs    PVC (premature ventricular contraction)    Hx of   Sleep apnea    no cpap per pt    Past Surgical History:  Procedure Laterality Date   BREAST LUMPECTOMY Right 08/29/14   started radiation 10/01/14   CATARACT EXTRACTION  2009   bilateral   COLONOSCOPY  2004-last 2016   normal   KNEE ARTHROSCOPY     right   NEEDLE CORE BIOPSY  RIGHT BREAST Right 08/14/14   OTHER SURGICAL HISTORY Left 1997   breast,cyst   PYLOROPLASTY  2016   RADIOACTIVE SEED GUIDED PARTIAL MASTECTOMY WITH AXILLARY SENTINEL LYMPH NODE BIOPSY Right 08/29/2014   Procedure: SEED LOCALIZED RIGHT BREAST LUMPECTOMY AND RIGHT AXILLARY SENTTINEL LYMPH NODE BIOPSY;  Surgeon: Excell Seltzer, MD;  Location: [place];  Service: General;  Laterality: Right;   WISDOM TOOTH EXTRACTION       ALLERGIES:  No Known Allergies   CURRENT MEDICATIONS:  Outpatient Encounter Medications as of 02/13/2019  Medication Sig   acetaminophen (TYLENOL) 500 MG tablet Take 500 mg by mouth every 6 (six) hours as needed.   aspirin 81 MG tablet Take 81 mg by mouth daily.   atorvastatin (LIPITOR) 10 MG tablet TAKE 1 TABLET BY MOUTH DAILY   betamethasone dipropionate (DIPROLENE) 0.05 % cream Apply topically as needed.   cycloSPORINE (RESTASIS) 0.05 % ophthalmic emulsion Place 1 drop into both eyes 2 (two) times daily.     esomeprazole (NEXIUM) 40 MG capsule Take 40 mg by mouth as needed.   hydrochlorothiazide (HYDRODIURIL) 25 MG tablet Take 1 tablet (25 mg total) by mouth daily. MUST SCHEDULE ANNUAL EXAM   methocarbamol (ROBAXIN) 500 MG tablet Take 0.5 tablets (250 mg total) by mouth 4 (four) times daily.   Multiple Vitamins-Minerals (HAIR/SKIN/NAILS PO) Take 1 tablet by mouth daily.   olmesartan (BENICAR) 20 MG  tablet TAKE 1 TABLET(20MG TOTAL) BY MOUTH DAILY   Potassium 99 MG TABS Take 1 tablet by mouth daily.     predniSONE (DELTASONE) 10 MG tablet Take 3 tabs on days 1-3, take 2 tabs on days 4-6, take 1 tab on days 7-9   No facility-administered encounter medications on file as of 02/13/2019.      ONCOLOGIC FAMILY HISTORY:  Family History  Problem Relation Age of Onset   Hypertension Mother    Heart disease Mother        CAD   Breast cancer Mother 70   Hypertension Sister    Breast cancer Sister 95   Aneurysm Sister    Hypertension Brother    Prostate cancer Brother 55   Leukemia Maternal Uncle 43   Heart disease Maternal Grandmother    Leukemia Maternal Uncle 66   Breast cancer Cousin        dx under 50   Colon cancer Cousin        dx in her  82s   Colon cancer Cousin    Lymphoma Other 51       NHL   Colon polyps Daughter    Stroke Father    Esophageal cancer Neg Hx    Stomach cancer Neg Hx    Rectal cancer Neg Hx       SOCIAL HISTORY:  Social History   Socioeconomic History   Marital status: Married    Spouse name: Homer   Number of children: 1   Years of education: 12   Highest education level: Not on file  Occupational History    Comment: retired  Scientist, product/process development strain: Not on Training and development officer insecurity:    Worry: Not on file    Inability: Not on Lexicographer needs:    Medical: Not on file    Non-medical: Not on file  Tobacco Use   Smoking status: Never Smoker   Smokeless tobacco: Never Used  Substance and Sexual Activity   Alcohol use: No    Alcohol/week: 0.0 standard drinks   Drug use: No   Sexual activity: Never  Lifestyle   Physical activity:    Days per week: Not on file    Minutes per session: Not on file   Stress: Not on file  Relationships   Social connections:    Talks on phone: Not on file    Gets together: Not on file    Attends religious service: Not on file    Active  member of club or organization: Not on file    Attends meetings of clubs or organizations: Not on file    Relationship status: Not on file   Intimate partner violence:    Fear of current or ex partner: Not on file    Emotionally abused: Not on file    Physically abused: Not on file    Forced sexual activity: Not on file  Other Topics Concern   Not on file  Social History Narrative   Consumes one glass of caffeine daily      OBJECTIVE:  Patient sounds well.  Speech is normal, non pressured, breathing is unlabored, behavior and mood are normal.  LABORATORY DATA:  None for this visit   DIAGNOSTIC IMAGING:  Most recent mammogram:    ASSESSMENT AND PLAN:  Miranda Mueller is a pleasant 78 y.o. female with history of Stage IA right breast invasive ductal carcinoma, ER-/PR-/HER2-, diagnosed in 08/2014, treated with lumpectomy, adjuvant radiation therapy.  [facility] and routine follow-up.   1. History of breast cancer:  Miranda Mueller is currently clinically and radiographically without evidence of disease or recurrence of breast cancer. She will be due for mammogram in 04/2019.  We discussed that at this point, she does not need to follow up with [place] at the cancer center on a regular basis.  She agrees and is comfortable with her PCP doing her breast exams and breast cancer surveillance from here on out.  She will see [place] on an as needed basis.     2. Back pain: Recommended she continue stretching, and also recommended she consider trying yoga to help with strengthening core and stretching her back.    3. Scalp Itching: I recommended she see Dr. Ubaldo Glassing at [facility].    4. Bone health:  Given Miranda Mueller's age, history of breast cancer, she is at risk for bone demineralization. Her last DEXA scan was in 2017 and was normal with  a T score of 0.2 in the right femoral neck. She will follow up with her PCP about when she should repeat bone density  testing. She was given education on specific food and activities to promote bone health.  5. Cancer screening:  Due to Miranda Mueller's history and her age, she should receive screening for skin cancers, colon cancer (ended in 10/2018). She was encouraged to follow-up with her PCP for appropriate cancer screenings.   6. Health maintenance and wellness promotion: Miranda Mueller was encouraged to consume 5-7 servings of fruits and vegetables per day. She was also encouraged to engage in moderate to vigorous exercise for 30 minutes per day most days of the week. She was instructed to limit her alcohol consumption and continue to abstain from tobacco use.     Follow up instructions:    -Return to cancer center PRN  -Mammogram due in 04/2019 -She is welcome to return back to the [facility] at any time; no additional follow-up needed at this time.   The patient was provided an opportunity to ask questions and all were answered. The patient agreed with the plan and demonstrated an understanding of the instructions.   The patient was advised to call back or seek an in-person evaluation if the symptoms worsen or if the condition fails to improve as anticipated.   I provided 12 minutes of non face-to-face telephone visit time during this encounter, and > 50% was spent counseling as documented under my assessment & plan.   Gardenia Phlegm, NP [facility] Program [facility] [phone]   Note: PRIMARY CARE PROVIDER Jearld Fenton, [place] [phone]

## 2019-02-18 ENCOUNTER — Other Ambulatory Visit: Payer: Self-pay | Admitting: Internal Medicine

## 2019-02-25 ENCOUNTER — Other Ambulatory Visit: Payer: Self-pay | Admitting: Internal Medicine

## 2019-02-25 DIAGNOSIS — I10 Essential (primary) hypertension: Secondary | ICD-10-CM

## 2019-03-28 ENCOUNTER — Ambulatory Visit (INDEPENDENT_AMBULATORY_CARE_PROVIDER_SITE_OTHER): Payer: Medicare Other | Admitting: Internal Medicine

## 2019-03-28 ENCOUNTER — Other Ambulatory Visit: Payer: Self-pay

## 2019-03-28 ENCOUNTER — Encounter: Payer: Self-pay | Admitting: Internal Medicine

## 2019-03-28 VITALS — BP 128/82 | HR 70 | Temp 97.8°F | Ht 66.0 in | Wt 265.0 lb

## 2019-03-28 DIAGNOSIS — E559 Vitamin D deficiency, unspecified: Secondary | ICD-10-CM | POA: Diagnosis not present

## 2019-03-28 DIAGNOSIS — C50511 Malignant neoplasm of lower-outer quadrant of right female breast: Secondary | ICD-10-CM | POA: Diagnosis not present

## 2019-03-28 DIAGNOSIS — R7309 Other abnormal glucose: Secondary | ICD-10-CM

## 2019-03-28 DIAGNOSIS — G4733 Obstructive sleep apnea (adult) (pediatric): Secondary | ICD-10-CM

## 2019-03-28 DIAGNOSIS — I10 Essential (primary) hypertension: Secondary | ICD-10-CM

## 2019-03-28 DIAGNOSIS — E7800 Pure hypercholesterolemia, unspecified: Secondary | ICD-10-CM | POA: Diagnosis not present

## 2019-03-28 DIAGNOSIS — Z171 Estrogen receptor negative status [ER-]: Secondary | ICD-10-CM

## 2019-03-28 DIAGNOSIS — Z0000 Encounter for general adult medical examination without abnormal findings: Secondary | ICD-10-CM | POA: Diagnosis not present

## 2019-03-28 DIAGNOSIS — K219 Gastro-esophageal reflux disease without esophagitis: Secondary | ICD-10-CM | POA: Diagnosis not present

## 2019-03-28 MED ORDER — METHOCARBAMOL 500 MG PO TABS: 500.0000 mg | ORAL_TABLET | Freq: Two times a day (BID) | ORAL | 1 refills | Status: DC | PRN

## 2019-03-28 NOTE — Patient Instructions (Signed)
Health Maintenance After Age 77 After age 77, you are at a higher risk for certain long-term diseases and infections as well as injuries from falls. Falls are a major cause of broken bones and head injuries in people who are older than age 77. Getting regular preventive care can help to keep you healthy and well. Preventive care includes getting regular testing and making lifestyle changes as recommended by your health care provider. Talk with your health care provider about:  Which screenings and tests you should have. A screening is a test that checks for a disease when you have no symptoms.  A diet and exercise plan that is right for you. What should I know about screenings and tests to prevent falls? Screening and testing are the best ways to find a health problem early. Early diagnosis and treatment give you the best chance of managing medical conditions that are common after age 77. Certain conditions and lifestyle choices may make you more likely to have a fall. Your health care provider may recommend:  Regular vision checks. Poor vision and conditions such as cataracts can make you more likely to have a fall. If you wear glasses, make sure to get your prescription updated if your vision changes.  Medicine review. Work with your health care provider to regularly review all of the medicines you are taking, including over-the-counter medicines. Ask your health care provider about any side effects that may make you more likely to have a fall. Tell your health care provider if any medicines that you take make you feel dizzy or sleepy.  Osteoporosis screening. Osteoporosis is a condition that causes the bones to get weaker. This can make the bones weak and cause them to break more easily.  Blood pressure screening. Blood pressure changes and medicines to control blood pressure can make you feel dizzy.  Strength and balance checks. Your health care provider may recommend certain tests to check your  strength and balance while standing, walking, or changing positions.  Foot health exam. Foot pain and numbness, as well as not wearing proper footwear, can make you more likely to have a fall.  Depression screening. You may be more likely to have a fall if you have a fear of falling, feel emotionally low, or feel unable to do activities that you used to do.  Alcohol use screening. Using too much alcohol can affect your balance and may make you more likely to have a fall. What actions can I take to lower my risk of falls? General instructions  Talk with your health care provider about your risks for falling. Tell your health care provider if: ? You fall. Be sure to tell your health care provider about all falls, even ones that seem minor. ? You feel dizzy, sleepy, or off-balance.  Take over-the-counter and prescription medicines only as told by your health care provider. These include any supplements.  Eat a healthy diet and maintain a healthy weight. A healthy diet includes low-fat dairy products, low-fat (lean) meats, and fiber from whole grains, beans, and lots of fruits and vegetables. Home safety  Remove any tripping hazards, such as rugs, cords, and clutter.  Install safety equipment such as grab bars in bathrooms and safety rails on stairs.  Keep rooms and walkways well-lit. Activity   Follow a regular exercise program to stay fit. This will help you maintain your balance. Ask your health care provider what types of exercise are appropriate for you.  If you need a cane or   walker, use it as recommended by your health care provider.  Wear supportive shoes that have nonskid soles. Lifestyle  Do not drink alcohol if your health care provider tells you not to drink.  If you drink alcohol, limit how much you have: ? 0-1 drink a day for women. ? 0-2 drinks a day for men.  Be aware of how much alcohol is in your drink. In the [place] one drink equals one typical bottle of beer (12  oz), one-half glass of wine (5 oz), or one shot of hard liquor (1 oz).  Do not use any products that contain nicotine or tobacco, such as cigarettes and e-cigarettes. If you need help quitting, ask your health care provider. Summary  Having a healthy lifestyle and getting preventive care can help to protect your health and wellness after age 77.  Screening and testing are the best way to find a health problem early and help you avoid having a fall. Early diagnosis and treatment give you the best chance for managing medical conditions that are more common for people who are older than age 77.  Falls are a major cause of broken bones and head injuries in people who are older than age 77. Take precautions to prevent a fall at home.  Work with your health care provider to learn what changes you can make to improve your health and wellness and to prevent falls. This information is not intended to replace advice given to you by your health care provider. Make sure you discuss any questions you have with your health care provider. Document Released: 08/11/2017 Document Revised: 08/11/2017 Document Reviewed: 08/11/2017 Elsevier Interactive Patient Education  2019 [employer]  

## 2019-03-28 NOTE — Progress Notes (Signed)
HPI:  Pt due for her subsequent Annual Medicare Wellness Exam.  She is also due to follow-up chronic conditions.  History of breast cancer: In remission.  She continues to get yearly mammograms.  She follows with Dr. Lindi Adie.  GERD: She denies breakthrough on is Esomeprazole.  Upper GI from 10/2003 reviewed.  HLD: Her last LDL was 77, 11/2017.  She denies myalgias on Atorvastatin.  She tries to consume a low-fat diet.  HTN: Her BP today is 128/82.  She is taking Olmesartan, HCTZ and a Potassium supplement as prescribed.  ECG from 08/2014 reviewed.  OSA: She rested well without the use of her CPAP.  There is no sleep study on file.  Past Medical History:  Diagnosis Date  . Bradycardia   . Breast cancer ([place]) 08/29/14   Right Breast -High Grade Invasive Mammary  . Cataract    surgery  . GERD (gastroesophageal reflux disease)   . History of shingles   . Hx of radiation therapy 10/01/14- 11/20/14   right breast/50.4 Gy/28 fx; right breast boost/10 Gy/5 fx  . Hyperlipidemia   . Hypertension   . IBS (irritable bowel syndrome)   . Multifocal PVCs   . PVC (premature ventricular contraction)    Hx of  . Sleep apnea    no cpap per pt     Current Outpatient Medications  Medication Sig Dispense Refill  . acetaminophen (TYLENOL) 500 MG tablet Take 500 mg by mouth every 6 (six) hours as needed.    Marland Kitchen aspirin 81 MG tablet Take 81 mg by mouth daily.    Marland Kitchen atorvastatin (LIPITOR) 10 MG tablet TAKE 1 TABLET BY MOUTH DAILY 90 tablet 0  . betamethasone dipropionate (DIPROLENE) 0.05 % cream Apply topically as needed.    . cycloSPORINE (RESTASIS) 0.05 % ophthalmic emulsion Place 1 drop into both eyes 2 (two) times daily.      Marland Kitchen esomeprazole (NEXIUM) 40 MG capsule Take 40 mg by mouth as needed.    . hydrochlorothiazide (HYDRODIURIL) 25 MG tablet TAKE 1 TABLET BY MOUTH EVERY DAY. MUST SCHEDULE ANNUAL EXAM 90 tablet 0  . methocarbamol (ROBAXIN) 500 MG tablet Take 0.5 tablets (250 mg total) by mouth 4  (four) times daily. 20 tablet 0  . Multiple Vitamins-Minerals (HAIR/SKIN/NAILS PO) Take 1 tablet by mouth daily.    Marland Kitchen olmesartan (BENICAR) 20 MG tablet TAKE 1 TABLET(20MG TOTAL) BY MOUTH DAILY 90 tablet 0  . Potassium 99 MG TABS Take 1 tablet by mouth daily.      . predniSONE (DELTASONE) 10 MG tablet Take 3 tabs on days 1-3, take 2 tabs on days 4-6, take 1 tab on days 7-9 18 tablet 0   No current facility-administered medications for this visit.     No Known Allergies  Family History  Problem Relation Age of Onset  . Hypertension Mother   . Heart disease Mother        CAD  . Breast cancer Mother 7  . Hypertension Sister   . Breast cancer Sister 1  . Aneurysm Sister   . Hypertension Brother   . Prostate cancer Brother 20  . Leukemia Maternal Uncle 39  . Heart disease Maternal Grandmother   . Leukemia Maternal Uncle 56  . Breast cancer Cousin        dx under 8  . Colon cancer Cousin        dx in her 5s  . Colon cancer Cousin   . Lymphoma Other 49  [employer]  . Colon polyps Daughter   . Stroke Father   . Esophageal cancer Neg Hx   . Stomach cancer Neg Hx   . Rectal cancer Neg Hx     Social History   Socioeconomic History  . Marital status: Married    Spouse name: Homer  . Number of children: 1  . Years of education: 14  . Highest education level: Not on file  Occupational History    Comment: retired  Scientific laboratory technician  . Financial resource strain: Not on file  . Food insecurity    Worry: Not on file    Inability: Not on file  . Transportation needs    Medical: Not on file    Non-medical: Not on file  Tobacco Use  . Smoking status: Never Smoker  . Smokeless tobacco: Never Used  Substance and Sexual Activity  . Alcohol use: No    Alcohol/week: 0.0 standard drinks  . Drug use: No  . Sexual activity: Never  Lifestyle  . Physical activity    Days per week: Not on file    Minutes per session: Not on file  . Stress: Not on file  Relationships  . Social  Herbalist on phone: Not on file    Gets together: Not on file    Attends religious service: Not on file    Active member of club or organization: Not on file    Attends meetings of clubs or organizations: Not on file    Relationship status: Not on file  . Intimate partner violence    Fear of current or ex partner: Not on file    Emotionally abused: Not on file    Physically abused: Not on file    Forced sexual activity: Not on file  Other Topics Concern  . Not on file  Social History Narrative   Consumes one glass of caffeine daily    Hospitiliaztions: None  Health Maintenance:    Flu: 09/2018  Tetanus: 04/2012  Pneumovax: 02/2010  Prevnar: 08/2015  Zostavax: 11/2007  Shingrix: 2019  Mammogram: 09/2018  Pap Smear: 04/2012, no longer screening  Bone Density: 02/2016  Colon Screening: 10/2018  Eye Doctor:: Annually  Dental Exam: Biannually   Providers:   PCP: Webb Silversmith, NP-C  Oncologist: Dr. Lindi Adie    I have personally reviewed and have noted:  1. The patient's medical and social history 2. Their use of alcohol, tobacco or illicit drugs 3. Their current medications and supplements 4. The patient's functional ability including ADL's, fall risks, home safety risks and hearing or visual impairment. 5. Diet and physical activities 6. Evidence for depression or mood disorder  Subjective:   Review of Systems:   Constitutional: Denies fever, malaise, fatigue, headache or abrupt weight changes.  HEENT: Denies eye pain, eye redness, ear pain, ringing in the ears, wax buildup, runny nose, nasal congestion, bloody nose, or sore throat. Respiratory: Denies difficulty breathing, shortness of breath, cough or sputum production.   Cardiovascular: Denies chest pain, chest tightness, palpitations or swelling in the hands or feet.  Gastrointestinal: Denies abdominal pain, bloating, constipation, diarrhea or blood in the stool.  GU: Denies urgency, frequency, pain with  urination, burning sensation, blood in urine, odor or discharge. Musculoskeletal: Pt reports intermittent back pain. Denies decrease in range of motion, difficulty with gait,  or joint pain and swelling.  Skin: Denies redness, rashes, lesions or ulcercations.  Neurological: Denies dizziness, difficulty with memory, difficulty with speech or problems with balance  and coordination.  Psych: Denies anxiety, depression, SI/HI.  No other specific complaints in a complete review of systems (except as listed in HPI above).  Objective:  PE:   BP 128/82   Pulse 70   Temp 97.8 F (36.6 C) (Oral)   Ht 5' 6"  (1.676 m)   Wt 265 lb (120.2 kg)   SpO2 97%   BMI 42.77 kg/m   Wt Readings from Last 3 Encounters:  11/25/18 269 lb (122 kg)  11/07/18 269 lb (122 kg)  10/24/18 269 lb (122 kg)    General: Appears her stated age, obese, in NAD. Skin: Warm, dry and intact. No rashes noted. HEENT: Head: normal shape and size; Eyes: sclera white, no icterus, conjunctiva pink, PERRLA and EOMs intact; Ears: Tm's gray and intact, normal light reflex; Neck: Neck supple, trachea midline. No masses, lumps or thyromegaly present.  Cardiovascular: Normal rate and rhythm. S1,S2 noted.  No murmur, rubs or gallops noted. No JVD or BLE edema. No carotid bruits noted. Pulmonary/Chest: Normal effort and positive vesicular breath sounds. No respiratory distress. No wheezes, rales or ronchi noted.  Abdomen: Soft and nontender. Normal bowel sounds. No distention or masses noted. Liver, spleen and kidneys non palpable. Musculoskeletal:  Strength 5/5 BUE/BLE. No signs of joint swelling.  Neurological: Alert and oriented. Cranial nerves II-XII grossly intact. Coordination normal.  Psychiatric: Mood and affect normal. Behavior is normal. Judgment and thought content normal.    BMET    Component Value Date/Time   NA 139 12/09/2017 1616   NA 138 08/22/2014 1237   K 4.1 12/09/2017 1616   K 3.8 08/22/2014 1237   CL 101  12/09/2017 1616   CO2 29 12/09/2017 1616   CO2 28 08/22/2014 1237   GLUCOSE 83 12/09/2017 1616   GLUCOSE 102 08/22/2014 1237   BUN 15 12/09/2017 1616   BUN 16.0 08/22/2014 1237   CREATININE 0.98 12/09/2017 1616   CREATININE 1.1 08/22/2014 1237   CALCIUM 9.9 12/09/2017 1616   CALCIUM 9.8 08/22/2014 1237   GFRNONAA 53.06 02/17/2010 1454   GFRAA 72 12/01/2007 1105    Lipid Panel     Component Value Date/Time   CHOL 139 12/09/2017 1616   TRIG 105.0 12/09/2017 1616   HDL 40.40 12/09/2017 1616   CHOLHDL 3 12/09/2017 1616   VLDL 21.0 12/09/2017 1616   LDLCALC 77 12/09/2017 1616    CBC    Component Value Date/Time   WBC 7.3 12/09/2017 1616   RBC 4.50 12/09/2017 1616   HGB 13.3 12/09/2017 1616   HGB 13.9 08/22/2014 1237   HCT 39.5 12/09/2017 1616   HCT 42.7 08/22/2014 1237   PLT 335.0 12/09/2017 1616   PLT 345 08/22/2014 1237   MCV 87.8 12/09/2017 1616   MCV 88.7 08/22/2014 1237   MCH 29.0 08/22/2014 1237   MCHC 33.7 12/09/2017 1616   RDW 14.0 12/09/2017 1616   RDW 13.8 08/22/2014 1237   LYMPHSABS 2.9 08/22/2014 1237   MONOABS 0.8 08/22/2014 1237   EOSABS 0.4 08/22/2014 1237   BASOSABS 0.1 08/22/2014 1237    Hgb A1C Lab Results  Component Value Date   HGBA1C 5.5 08/27/2015      Assessment and Plan:   Medicare Annual Wellness Visit:  Diet: She does eat meat. She consumes fruits and veggies daily. She tries to avoid fried foods. She drinks mostly water. Physical activity: Sedentary Depression/mood screen: Negative, PHQ 9 score of ) Hearing: Intact to whispered voice Visual acuity: Grossly normal, performs annual eye exam  ADLs: Capable Fall risk: None Home safety: Good Cognitive evaluation: Intact to orientation, naming, recall and repetition EOL planning: Living Will, full code/ I agree  Preventative Medicine: Encouraged her to get a flu shot in the fall.  Tetanus, Pneumovax, Prevnar, Zostavax and Shingrix up-to-date.  Mammogram up-to-date.  She is no  longer screening for cervical cancer.  Colonoscopy up-to-date.  Bone density up-to-date.  Encouraged her to consume a balanced diet and exercise regimen.  Advised her to see an eye doctor and dentist annually.  Will check CBC, C met, lipid, A1c and vitamin D today.   Next appointment: 1 year, Medicare wellness exam   Webb Silversmith, NP

## 2019-03-29 ENCOUNTER — Other Ambulatory Visit: Payer: Self-pay | Admitting: Internal Medicine

## 2019-03-29 DIAGNOSIS — I10 Essential (primary) hypertension: Secondary | ICD-10-CM

## 2019-03-29 LAB — COMPREHENSIVE METABOLIC PANEL
ALT: 35 U/L (ref 0–35)
AST: 27 U/L (ref 0–37)
Albumin: 4.3 g/dL (ref 3.5–5.2)
Alkaline Phosphatase: 51 U/L (ref 39–117)
BUN: 20 mg/dL (ref 6–23)
CO2: 28 mEq/L (ref 19–32)
Calcium: 9.5 mg/dL (ref 8.4–10.5)
Chloride: 99 mEq/L (ref 96–112)
Creatinine, Ser: 1.08 mg/dL (ref 0.40–1.20)
GFR: 49.18 mL/min — ABNORMAL LOW (ref >60.00)
Glucose, Bld: 79 mg/dL (ref 70–99)
Potassium: 3.7 mEq/L (ref 3.5–5.1)
Sodium: 138 mEq/L (ref 135–145)
Total Bilirubin: 0.6 mg/dL (ref 0.2–1.2)
Total Protein: 7.6 g/dL (ref 6.0–8.3)

## 2019-03-29 LAB — CBC
HCT: 40.4 % (ref 36.0–46.0)
Hemoglobin: 13.5 g/dL (ref 12.0–15.0)
MCHC: 33.4 g/dL (ref 30.0–36.0)
MCV: 90.8 fl (ref 78.0–100.0)
Platelets: 350 10*3/uL (ref 150.0–400.0)
RBC: 4.45 Mil/uL (ref 3.87–5.11)
RDW: 13.6 % (ref 11.5–15.5)
WBC: 8.1 10*3/uL (ref 4.0–10.5)

## 2019-03-29 LAB — LIPID PANEL
Cholesterol: 156 mg/dL (ref 0–200)
HDL: 47.9 mg/dL (ref >39.00)
LDL Cholesterol: 88 mg/dL (ref 0–99)
NonHDL: 107.66
Total CHOL/HDL Ratio: 3
Triglycerides: 97 mg/dL (ref 0.0–149.0)
VLDL: 19.4 mg/dL (ref 0.0–40.0)

## 2019-03-29 LAB — VITAMIN D 25 HYDROXY (VIT D DEFICIENCY, FRACTURES): VITD: 43.99 ng/mL (ref 30.00–100.00)

## 2019-03-29 LAB — HEMOGLOBIN A1C: Hgb A1c MFr Bld: 5.8 % (ref 4.6–6.5)

## 2019-03-29 MED ORDER — ATORVASTATIN CALCIUM 10 MG PO TABS: 10.0000 mg | ORAL_TABLET | Freq: Every day | ORAL | 1 refills | Status: DC

## 2019-03-29 MED ORDER — HYDROCHLOROTHIAZIDE 25 MG PO TABS: ORAL_TABLET | ORAL | 1 refills | Status: DC

## 2019-03-29 MED ORDER — OLMESARTAN MEDOXOMIL 20 MG PO TABS: ORAL_TABLET | ORAL | 1 refills | Status: DC

## 2019-03-29 MED ORDER — ESOMEPRAZOLE MAGNESIUM 40 MG PO CPDR: 40.0000 mg | DELAYED_RELEASE_CAPSULE | Freq: Every day | ORAL | 1 refills | Status: DC

## 2019-04-02 ENCOUNTER — Encounter: Payer: Self-pay | Admitting: Internal Medicine

## 2019-04-02 NOTE — Assessment & Plan Note (Signed)
CBC and C met today Discussed avoiding triggers and how weight loss could help improve reflux Continue Esomeprazole for now

## 2019-04-02 NOTE — Assessment & Plan Note (Signed)
Continue Olmesartan, HCTZ and potassium C met today Reinforced DASH diet and exercise for weight loss

## 2019-04-02 NOTE — Assessment & Plan Note (Signed)
C met and lipid profile today Encouraged her to consume a low-fat diet Continue Atorvastatin for now, will adjust if needed based on labs 

## 2019-04-02 NOTE — Assessment & Plan Note (Signed)
Continue yearly mammograms Continue to follow with oncology

## 2019-04-02 NOTE — Assessment & Plan Note (Signed)
Discussed how weight loss could help improve her sleep apnea Will monitor

## 2019-07-17 ENCOUNTER — Ambulatory Visit (INDEPENDENT_AMBULATORY_CARE_PROVIDER_SITE_OTHER): Payer: Medicare Other | Admitting: Internal Medicine

## 2019-07-17 ENCOUNTER — Encounter: Payer: Self-pay | Admitting: Internal Medicine

## 2019-07-17 ENCOUNTER — Other Ambulatory Visit: Payer: Self-pay

## 2019-07-17 VITALS — BP 130/82 | HR 64 | Temp 98.2°F

## 2019-07-17 DIAGNOSIS — Z23 Encounter for immunization: Secondary | ICD-10-CM | POA: Diagnosis not present

## 2019-07-17 DIAGNOSIS — M5441 Lumbago with sciatica, right side: Secondary | ICD-10-CM

## 2019-07-17 MED ORDER — PREDNISONE 10 MG PO TABS: ORAL_TABLET | ORAL | 0 refills | Status: DC

## 2019-07-17 MED ORDER — METHYLPREDNISOLONE ACETATE 80 MG/ML IJ SUSP
80.0000 mg | Freq: Once | INTRAMUSCULAR | Status: AC
  Administered 2019-07-17: 80 mg via INTRAMUSCULAR

## 2019-07-17 MED ORDER — TIZANIDINE HCL 4 MG PO TABS: 4.0000 mg | ORAL_TABLET | Freq: Two times a day (BID) | ORAL | 0 refills | Status: DC | PRN

## 2019-07-17 NOTE — Addendum Note (Signed)
Addended by: Lurlean Nanny on: 07/17/2019 04:50 PM   Modules accepted: Orders

## 2019-07-17 NOTE — Addendum Note (Signed)
Addended by: Lurlean Nanny on: 07/17/2019 05:02 PM   Modules accepted: Orders

## 2019-07-17 NOTE — Patient Instructions (Signed)
Sciatica ° °Sciatica is pain, weakness, tingling, or loss of feeling (numbness) along the sciatic nerve. The sciatic nerve starts in the lower back and goes down the back of each leg. Sciatica usually goes away on its own or with treatment. Sometimes, sciatica may come back (recur). °What are the causes? °This condition happens when the sciatic nerve is pinched or has pressure put on it. This may be the result of: °· A disk in between the bones of the spine bulging out too far (herniated disk). °· Changes in the spinal disks that occur with aging. °· A condition that affects a muscle in the butt. °· Extra bone growth near the sciatic nerve. °· A break (fracture) of the area between your hip bones (pelvis). °· Pregnancy. °· Tumor. This is rare. °What increases the risk? °You are more likely to develop this condition if you: °· Play sports that put pressure or stress on the spine. °· Have poor strength and ease of movement (flexibility). °· Have had a back injury in the past. °· Have had back surgery. °· Sit for long periods of time. °· Do activities that involve bending or lifting over and over again. °· Are very overweight (obese). °What are the signs or symptoms? °Symptoms can vary from mild to very bad. They may include: °· Any of these problems in the lower back, leg, hip, or butt: °? Mild tingling, loss of feeling, or dull aches. °? Burning sensations. °? Sharp pains. °· Loss of feeling in the back of the calf or the sole of the foot. °· Leg weakness. °· Very bad back pain that makes it hard to move. °These symptoms may get worse when you cough, sneeze, or laugh. They may also get worse when you sit or stand for long periods of time. °How is this treated? °This condition often gets better without any treatment. However, treatment may include: °· Changing or cutting back on physical activity when you have pain. °· Doing exercises and stretching. °· Putting ice or heat on the affected area. °· Medicines that  help: °? To relieve pain and swelling. °? To relax your muscles. °· Shots (injections) of medicines that help to relieve pain, irritation, and swelling. °· Surgery. °Follow these instructions at home: °Medicines °· Take over-the-counter and prescription medicines only as told by your doctor. °· Ask your doctor if the medicine prescribed to you: °? Requires you to avoid driving or using heavy machinery. °? Can cause trouble pooping (constipation). You may need to take these steps to prevent or treat trouble pooping: °§ Drink enough fluids to keep your pee (urine) pale yellow. °§ Take over-the-counter or prescription medicines. °§ Eat foods that are high in fiber. These include beans, whole grains, and fresh fruits and vegetables. °§ Limit foods that are high in fat and sugar. These include fried or sweet foods. °Managing pain ° °  ° °· If told, put ice on the affected area. °? Put ice in a plastic bag. °? Place a towel between your skin and the bag. °? Leave the ice on for 20 minutes, 2-3 times a day. °· If told, put heat on the affected area. Use the heat source that your doctor tells you to use, such as a moist heat pack or a heating pad. °? Place a towel between your skin and the heat source. °? Leave the heat on for 20-30 minutes. °? Remove the heat if your skin turns bright red. This is very important if you are   unable to feel pain, heat, or cold. You may have a greater risk of getting burned. °Activity ° °· Return to your normal activities as told by your doctor. Ask your doctor what activities are safe for you. °· Avoid activities that make your symptoms worse. °· Take short rests during the day. °? When you rest for a long time, do some physical activity or stretching between periods of rest. °? Avoid sitting for a long time without moving. Get up and move around at least one time each hour. °· Exercise and stretch regularly, as told by your doctor. °· Do not lift anything that is heavier than 10 lb (4.5 kg)  while you have symptoms of sciatica. °? Avoid lifting heavy things even when you do not have symptoms. °? Avoid lifting heavy things over and over. °· When you lift objects, always lift in a way that is safe for your body. To do this, you should: °? Bend your knees. °? Keep the object close to your body. °? Avoid twisting. °General instructions °· Stay at a healthy weight. °· Wear comfortable shoes that support your feet. Avoid wearing high heels. °· Avoid sleeping on a mattress that is too soft or too hard. You might have less pain if you sleep on a mattress that is firm enough to support your back. °· Keep all follow-up visits as told by your doctor. This is important. °Contact a doctor if: °· You have pain that: °? Wakes you up when you are sleeping. °? Gets worse when you lie down. °? Is worse than the pain you have had in the past. °? Lasts longer than 4 weeks. °· You lose weight without trying. °Get help right away if: °· You cannot control when you pee (urinate) or poop (have a bowel movement). °· You have weakness in any of these areas and it gets worse: °? Lower back. °? The area between your hip bones. °? Butt. °? Legs. °· You have redness or swelling of your back. °· You have a burning feeling when you pee. °Summary °· Sciatica is pain, weakness, tingling, or loss of feeling (numbness) along the sciatic nerve. °· This condition happens when the sciatic nerve is pinched or has pressure put on it. °· Sciatica can cause pain, tingling, or loss of feeling (numbness) in the lower back, legs, hips, and butt. °· Treatment often includes rest, exercise, medicines, and putting ice or heat on the affected area. °This information is not intended to replace advice given to you by your health care provider. Make sure you discuss any questions you have with your health care provider. °Document Released: 07/07/2008 Document Revised: 10/17/2018 Document Reviewed: 10/17/2018 °[employer] Patient Education © 2020 [employer] ° °

## 2019-07-17 NOTE — Progress Notes (Signed)
Subjective:    Patient ID: Miranda Mueller, female    DOB: 03-07-1941, 78 y.o.   MRN: [id number]  HPI  Pt presents to the clinic today with c/o back pain. This started 4-5 days ago. She noticed the pain while picking turnip greens on Wednesday afternoon, but when she woke up Thursday morning the pain was worse. She describes the pain as grabbing. The pain radiates into her right hip. The pain is worse with standing and walking. Better with sitting and laying on her left side. She denies numbness, tingling or weakness. She denies loss of bowel or bladder. She denies any injury to the area. She has had a TENS unit, Tylenol, Ibuprofen, Robaxin, Salon [place], heat and ice with minimal relief of symptoms.    Review of Systems  Past Medical History:  Diagnosis Date  . Bradycardia   . Breast cancer ([place]) 08/29/14   Right Breast -High Grade Invasive Mammary  . Cataract    surgery  . GERD (gastroesophageal reflux disease)   . History of shingles   . Hx of radiation therapy 10/01/14- 11/20/14   right breast/50.4 Gy/28 fx; right breast boost/10 Gy/5 fx  . Hyperlipidemia   . Hypertension   . IBS (irritable bowel syndrome)   . Multifocal PVCs   . PVC (premature ventricular contraction)    Hx of  . Sleep apnea    no cpap per pt     Current Outpatient Medications  Medication Sig Dispense Refill  . acetaminophen (TYLENOL) 500 MG tablet Take 500 mg by mouth every 6 (six) hours as needed.    Marland Kitchen aspirin 81 MG tablet Take 81 mg by mouth daily.    Marland Kitchen atorvastatin (LIPITOR) 10 MG tablet Take 1 tablet (10 mg total) by mouth daily. 90 tablet 1  . betamethasone dipropionate (DIPROLENE) 0.05 % cream Apply topically as needed.    . cycloSPORINE (RESTASIS) 0.05 % ophthalmic emulsion Place 1 drop into both eyes 2 (two) times daily.      Marland Kitchen esomeprazole (NEXIUM) 40 MG capsule Take 1 capsule (40 mg total) by mouth daily. 90 capsule 1  . hydrochlorothiazide (HYDRODIURIL) 25 MG tablet TAKE 1 TABLET BY MOUTH EVERY DAY.  90 tablet 1  . methocarbamol (ROBAXIN) 500 MG tablet Take 1 tablet (500 mg total) by mouth 2 (two) times daily as needed for muscle spasms. 30 tablet 1  . Multiple Vitamins-Minerals (HAIR/SKIN/NAILS PO) Take 1 tablet by mouth daily.    Marland Kitchen olmesartan (BENICAR) 20 MG tablet TAKE 1 TABLET(20MG  TOTAL) BY MOUTH DAILY 90 tablet 1  . Potassium 99 MG TABS Take 1 tablet by mouth daily.       No current facility-administered medications for this visit.     No Known Allergies  Family History  Problem Relation Age of Onset  . Hypertension Mother   . Heart disease Mother        CAD  . Breast cancer Mother 80  . Hypertension Sister   . Breast cancer Sister 60  . Aneurysm Sister   . Hypertension Brother   . Prostate cancer Brother 68  . Leukemia Maternal Uncle 75  . Heart disease Maternal Grandmother   . Leukemia Maternal Uncle 49  . Breast cancer Cousin        dx under 32  . Colon cancer Cousin        dx in her 12s  . Colon cancer Cousin   . Lymphoma Other 49       NHL  .  Colon polyps Daughter   . Stroke Father   . Esophageal cancer Neg Hx   . Stomach cancer Neg Hx   . Rectal cancer Neg Hx     Social History   Socioeconomic History  . Marital status: Married    Spouse name: Homer  . Number of children: 1  . Years of education: 16  . Highest education level: Not on file  Occupational History    Comment: retired  Scientific laboratory technician  . Financial resource strain: Not on file  . Food insecurity    Worry: Not on file    Inability: Not on file  . Transportation needs    Medical: Not on file    Non-medical: Not on file  Tobacco Use  . Smoking status: Never Smoker  . Smokeless tobacco: Never Used  Substance and Sexual Activity  . Alcohol use: No    Alcohol/week: 0.0 standard drinks  . Drug use: No  . Sexual activity: Never  Lifestyle  . Physical activity    Days per week: Not on file    Minutes per session: Not on file  . Stress: Not on file  Relationships  . Social  Herbalist on phone: Not on file    Gets together: Not on file    Attends religious service: Not on file    Active member of club or organization: Not on file    Attends meetings of clubs or organizations: Not on file    Relationship status: Not on file  . Intimate partner violence    Fear of current or ex partner: Not on file    Emotionally abused: Not on file    Physically abused: Not on file    Forced sexual activity: Not on file  Other Topics Concern  . Not on file  Social History Narrative   Consumes one glass of caffeine daily     Constitutional: Denies fever, malaise, fatigue, headache or abrupt weight changes.  Respiratory: Denies difficulty breathing, shortness of breath, cough or sputum production.   Cardiovascular: Denies chest pain, chest tightness, palpitations or swelling in the hands or feet.  Gastrointestinal: Denies abdominal pain, bloating, constipation, diarrhea or blood in the stool.  GU: Denies urgency, frequency, pain with urination, burning sensation, blood in urine, odor or discharge. Musculoskeletal: Pt reports low back pain. Denies decrease in range of motion, difficulty with gait, muscle pain or joint pain and swelling.  Skin: Denies redness, rashes, lesions or ulcercations.  Neurological: Denies numbness, tingling or problems with balance and coordination.    No other specific complaints in a complete review of systems (except as listed in HPI above).     Objective:   Physical Exam   BP 130/82   Pulse 64   Temp 98.2 F (36.8 C) (Temporal)  Wt Readings from Last 3 Encounters:  03/28/19 265 lb (120.2 kg)  11/25/18 269 lb (122 kg)  11/07/18 269 lb (122 kg)    General: Appears her stated age, obese, in NAD. Skin: Warm, dry and intact. No rashes noted. Cardiovascular: Normal rate and rhythm. Pulmonary/Chest: Normal effort and positive vesicular breath sounds. No respiratory distress. No wheezes, rales or ronchi noted.   Musculoskeletal: Normal flexion, rotation and lateral bending to the left. Decreased extension, rotation and lateral bending to the right. Bony tenderness noted over the lumbar spine. Able to stand on heels and toes. Strength 5/5 BLE. Neurological: Alert and oriented. Negative SLR on the right.   BMET  Component Value Date/Time   NA 138 03/28/2019 1545   NA 138 08/22/2014 1237   K 3.7 03/28/2019 1545   K 3.8 08/22/2014 1237   CL 99 03/28/2019 1545   CO2 28 03/28/2019 1545   CO2 28 08/22/2014 1237   GLUCOSE 79 03/28/2019 1545   GLUCOSE 102 08/22/2014 1237   BUN 20 03/28/2019 1545   BUN 16.0 08/22/2014 1237   CREATININE 1.08 03/28/2019 1545   CREATININE 1.1 08/22/2014 1237   CALCIUM 9.5 03/28/2019 1545   CALCIUM 9.8 08/22/2014 1237   GFRNONAA 53.06 02/17/2010 1454   GFRAA 72 12/01/2007 1105    Lipid Panel     Component Value Date/Time   CHOL 156 03/28/2019 1545   TRIG 97.0 03/28/2019 1545   HDL 47.90 03/28/2019 1545   CHOLHDL 3 03/28/2019 1545   VLDL 19.4 03/28/2019 1545   LDLCALC 88 03/28/2019 1545    CBC    Component Value Date/Time   WBC 8.1 03/28/2019 1545   RBC 4.45 03/28/2019 1545   HGB 13.5 03/28/2019 1545   HGB 13.9 08/22/2014 1237   HCT 40.4 03/28/2019 1545   HCT 42.7 08/22/2014 1237   PLT 350.0 03/28/2019 1545   PLT 345 08/22/2014 1237   MCV 90.8 03/28/2019 1545   MCV 88.7 08/22/2014 1237   MCH 29.0 08/22/2014 1237   MCHC 33.4 03/28/2019 1545   RDW 13.6 03/28/2019 1545   RDW 13.8 08/22/2014 1237   LYMPHSABS 2.9 08/22/2014 1237   MONOABS 0.8 08/22/2014 1237   EOSABS 0.4 08/22/2014 1237   BASOSABS 0.1 08/22/2014 1237    Hgb A1C Lab Results  Component Value Date   HGBA1C 5.8 03/28/2019          Assessment & Plan:   Acute Low Back Pain with Right Sided Sciatica:  Xray from 11/2016 reviewed- no indication to repeat at this time Depo Medrol 80 mg IM x 1 RX for Pred Taper x 9 days RX for Zanaflex 4 mg caps (do no use with Methocarbamol)  Alternate heat and ice Stretching exercises given If no improvement, consider referral to PT  Return precautions discussed Webb Silversmith, NP

## 2019-08-31 ENCOUNTER — Other Ambulatory Visit: Payer: Self-pay | Admitting: Internal Medicine

## 2019-08-31 DIAGNOSIS — I10 Essential (primary) hypertension: Secondary | ICD-10-CM

## 2019-10-10 ENCOUNTER — Other Ambulatory Visit: Payer: Self-pay | Admitting: Internal Medicine

## 2019-10-15 ENCOUNTER — Other Ambulatory Visit: Payer: Self-pay | Admitting: Internal Medicine

## 2019-10-15 DIAGNOSIS — I10 Essential (primary) hypertension: Secondary | ICD-10-CM

## 2019-10-17 DIAGNOSIS — R922 Inconclusive mammogram: Secondary | ICD-10-CM | POA: Diagnosis not present

## 2019-10-17 LAB — HM MAMMOGRAPHY

## 2019-10-18 ENCOUNTER — Encounter: Payer: Self-pay | Admitting: Internal Medicine

## 2019-11-13 ENCOUNTER — Telehealth: Payer: Self-pay | Admitting: *Deleted

## 2019-11-13 NOTE — Telephone Encounter (Signed)
Patient left a voicemail wanting to know if she should take the covid vaccine? Patient stated that she had cancer, but it has been over 5 years ago.. Patient stated that someone told her if she has a history of cancer she should not take the vaccine. Please advise.

## 2019-11-13 NOTE — Telephone Encounter (Signed)
She needs to be vaccinated.

## 2019-11-14 DIAGNOSIS — U071 COVID-19: Secondary | ICD-10-CM | POA: Diagnosis not present

## 2019-11-14 DIAGNOSIS — Z20828 Contact with and (suspected) exposure to other viral communicable diseases: Secondary | ICD-10-CM | POA: Diagnosis not present

## 2019-11-15 MED ORDER — FLUTICASONE PROPIONATE 50 MCG/ACT NA SUSP: 2.0000 | Freq: Every day | NASAL | 2 refills | Status: DC

## 2019-11-15 NOTE — Telephone Encounter (Signed)
Pt is aware as instructed 

## 2019-11-15 NOTE — Addendum Note (Signed)
Addended by: Lurlean Nanny on: 11/15/2019 09:13 AM   Modules accepted: Orders

## 2019-12-18 DIAGNOSIS — H1712 Central corneal opacity, left eye: Secondary | ICD-10-CM | POA: Diagnosis not present

## 2019-12-18 DIAGNOSIS — H524 Presbyopia: Secondary | ICD-10-CM | POA: Diagnosis not present

## 2019-12-18 DIAGNOSIS — H04123 Dry eye syndrome of bilateral lacrimal glands: Secondary | ICD-10-CM | POA: Diagnosis not present

## 2019-12-18 DIAGNOSIS — D4981 Neoplasm of unspecified behavior of retina and choroid: Secondary | ICD-10-CM | POA: Diagnosis not present

## 2019-12-18 DIAGNOSIS — Z961 Presence of intraocular lens: Secondary | ICD-10-CM | POA: Diagnosis not present

## 2020-04-25 ENCOUNTER — Other Ambulatory Visit: Payer: Self-pay | Admitting: Internal Medicine

## 2020-04-25 DIAGNOSIS — I10 Essential (primary) hypertension: Secondary | ICD-10-CM

## 2020-05-16 ENCOUNTER — Ambulatory Visit: Payer: Medicare Other | Admitting: Internal Medicine

## 2020-05-28 ENCOUNTER — Other Ambulatory Visit: Payer: Self-pay

## 2020-05-28 ENCOUNTER — Telehealth: Payer: Medicare Other | Admitting: Family Medicine

## 2020-07-02 ENCOUNTER — Ambulatory Visit (INDEPENDENT_AMBULATORY_CARE_PROVIDER_SITE_OTHER): Payer: Medicare Other | Admitting: Internal Medicine

## 2020-07-02 ENCOUNTER — Other Ambulatory Visit: Payer: Self-pay

## 2020-07-02 ENCOUNTER — Encounter: Payer: Self-pay | Admitting: Internal Medicine

## 2020-07-02 VITALS — BP 132/84 | HR 66 | Temp 97.1°F | Ht 66.0 in | Wt 260.0 lb

## 2020-07-02 DIAGNOSIS — Z171 Estrogen receptor negative status [ER-]: Secondary | ICD-10-CM | POA: Diagnosis not present

## 2020-07-02 DIAGNOSIS — G4733 Obstructive sleep apnea (adult) (pediatric): Secondary | ICD-10-CM | POA: Diagnosis not present

## 2020-07-02 DIAGNOSIS — E7800 Pure hypercholesterolemia, unspecified: Secondary | ICD-10-CM

## 2020-07-02 DIAGNOSIS — C50511 Malignant neoplasm of lower-outer quadrant of right female breast: Secondary | ICD-10-CM

## 2020-07-02 DIAGNOSIS — L309 Dermatitis, unspecified: Secondary | ICD-10-CM | POA: Insufficient documentation

## 2020-07-02 DIAGNOSIS — M5441 Lumbago with sciatica, right side: Secondary | ICD-10-CM | POA: Diagnosis not present

## 2020-07-02 DIAGNOSIS — E559 Vitamin D deficiency, unspecified: Secondary | ICD-10-CM

## 2020-07-02 DIAGNOSIS — Z0000 Encounter for general adult medical examination without abnormal findings: Secondary | ICD-10-CM

## 2020-07-02 DIAGNOSIS — Z780 Asymptomatic menopausal state: Secondary | ICD-10-CM

## 2020-07-02 DIAGNOSIS — I10 Essential (primary) hypertension: Secondary | ICD-10-CM

## 2020-07-02 DIAGNOSIS — R7303 Prediabetes: Secondary | ICD-10-CM | POA: Diagnosis not present

## 2020-07-02 DIAGNOSIS — Z23 Encounter for immunization: Secondary | ICD-10-CM

## 2020-07-02 DIAGNOSIS — G8929 Other chronic pain: Secondary | ICD-10-CM | POA: Diagnosis not present

## 2020-07-02 DIAGNOSIS — K219 Gastro-esophageal reflux disease without esophagitis: Secondary | ICD-10-CM

## 2020-07-02 DIAGNOSIS — L308 Other specified dermatitis: Secondary | ICD-10-CM | POA: Diagnosis not present

## 2020-07-02 LAB — COMPREHENSIVE METABOLIC PANEL
ALT: 21 U/L (ref 0–35)
AST: 17 U/L (ref 0–37)
Albumin: 4.2 g/dL (ref 3.5–5.2)
Alkaline Phosphatase: 56 U/L (ref 39–117)
BUN: 18 mg/dL (ref 6–23)
CO2: 29 mEq/L (ref 19–32)
Calcium: 9.6 mg/dL (ref 8.4–10.5)
Chloride: 100 mEq/L (ref 96–112)
Creatinine, Ser: 0.98 mg/dL (ref 0.40–1.20)
GFR: 54.84 mL/min — ABNORMAL LOW (ref >60.00)
Glucose, Bld: 111 mg/dL — ABNORMAL HIGH (ref 70–99)
Potassium: 3.7 mEq/L (ref 3.5–5.1)
Sodium: 138 mEq/L (ref 135–145)
Total Bilirubin: 0.6 mg/dL (ref 0.2–1.2)
Total Protein: 7.3 g/dL (ref 6.0–8.3)

## 2020-07-02 LAB — LIPID PANEL
Cholesterol: 141 mg/dL (ref 0–200)
HDL: 40.9 mg/dL (ref >39.00)
LDL Cholesterol: 70 mg/dL (ref 0–99)
NonHDL: 99.92
Total CHOL/HDL Ratio: 3
Triglycerides: 149 mg/dL (ref 0.0–149.0)
VLDL: 29.8 mg/dL (ref 0.0–40.0)

## 2020-07-02 LAB — CBC
HCT: 40.9 % (ref 36.0–46.0)
Hemoglobin: 13.7 g/dL (ref 12.0–15.0)
MCHC: 33.5 g/dL (ref 30.0–36.0)
MCV: 88 fl (ref 78.0–100.0)
Platelets: 366 10*3/uL (ref 150.0–400.0)
RBC: 4.65 Mil/uL (ref 3.87–5.11)
RDW: 14.1 % (ref 11.5–15.5)
WBC: 8.4 10*3/uL (ref 4.0–10.5)

## 2020-07-02 LAB — HEMOGLOBIN A1C: Hgb A1c MFr Bld: 6.1 % (ref 4.6–6.5)

## 2020-07-02 LAB — VITAMIN D 25 HYDROXY (VIT D DEFICIENCY, FRACTURES): VITD: 26.57 ng/mL — ABNORMAL LOW (ref 30.00–100.00)

## 2020-07-02 MED ORDER — BETAMETHASONE DIPROPIONATE 0.05 % EX CREA: TOPICAL_CREAM | CUTANEOUS | 1 refills | Status: DC | PRN

## 2020-07-02 MED ORDER — METHOCARBAMOL 500 MG PO TABS: 500.0000 mg | ORAL_TABLET | Freq: Every day | ORAL | 0 refills | Status: DC | PRN

## 2020-07-02 NOTE — Assessment & Plan Note (Signed)
C met and lipid profile today Encouraged her to consume a low-fat diet Continue Atorvastatin

## 2020-07-02 NOTE — Progress Notes (Addendum)
HPI:  Patient presents the clinic today for her subsequent annual Medicare wellness exam.  She is also due to follow-up chronic conditions.  Hx of Breast Cancer: In remission.  She continues to get yearly mammograms.    GERD: She denies breakthrough on Esomeprazole.  Upper GI from 10/2003 reviewed.  HLD: Her last LDL was 88, 05/2019.  She denies myalgias on Atorvastatin.  She consumes a low-fat diet.  HTN: Her BP today is 132/84.  She is taking Olmesartan, HCTZ and Potassium supplement as prescribed.  ECG from 08/2014 reviewed.  OSA: She reports she sleeps well without the use of her CPAP.  There is no sleep study on file.  Chronic Back Pain with Right Sided Sciatica: Persistent. She takes Tylenol and Methocarbamol as needed for her back. She would like a refill on Methocarbamol today.   Exzema: Controlled with Betamethasone cream as needed. She would like a refill of this today.  Prediabetes: Her last A1C was 5.8%, 03/2019. She does not check her sugars. She has lost 9 lbs.   Past Medical History:  Diagnosis Date  . Bradycardia   . Breast cancer ([place]) 08/29/14   Right Breast -High Grade Invasive Mammary  . Cataract    surgery  . GERD (gastroesophageal reflux disease)   . History of shingles   . Hx of radiation therapy 10/01/14- 11/20/14   right breast/50.4 Gy/28 fx; right breast boost/10 Gy/5 fx  . Hyperlipidemia   . Hypertension   . IBS (irritable bowel syndrome)   . Multifocal PVCs   . PVC (premature ventricular contraction)    Hx of  . Sleep apnea    no cpap per pt     Current Outpatient Medications  Medication Sig Dispense Refill  . acetaminophen (TYLENOL) 500 MG tablet Take 500 mg by mouth every 6 (six) hours as needed.    Marland Kitchen aspirin 81 MG tablet Take 81 mg by mouth daily.    Marland Kitchen atorvastatin (LIPITOR) 10 MG tablet TAKE ONE TABLET BY MOUTH DAILY 90 tablet 1  . betamethasone dipropionate (DIPROLENE) 0.05 % cream Apply topically as needed.    . cycloSPORINE (RESTASIS) 0.05  % ophthalmic emulsion Place 1 drop into both eyes 2 (two) times daily.      Marland Kitchen esomeprazole (NEXIUM) 40 MG capsule Take 1 capsule (40 mg total) by mouth daily at 12 noon. 90 capsule 0  . fluticasone (FLONASE) 50 MCG/ACT nasal spray Place 2 sprays into both nostrils daily. 16 g 2  . hydrochlorothiazide (HYDRODIURIL) 25 MG tablet TAKE 1 TABLET BY MOUTH EVERY DAY. 90 tablet 1  . methocarbamol (ROBAXIN) 500 MG tablet Take 1 tablet (500 mg total) by mouth 2 (two) times daily as needed for muscle spasms. 30 tablet 1  . Multiple Vitamins-Minerals (HAIR/SKIN/NAILS PO) Take 1 tablet by mouth daily.    Marland Kitchen olmesartan (BENICAR) 20 MG tablet TAKE 1 TABLET BY MOUTH DAILY 90 tablet 0  . Potassium 99 MG TABS Take 1 tablet by mouth daily.      . predniSONE (DELTASONE) 10 MG tablet 3 tablets daily x 3 days, 2 tablets daily x 3 days, 1 tablet daily x 3 days 18 tablet 0  . tiZANidine (ZANAFLEX) 4 MG tablet Take 1 tablet (4 mg total) by mouth 2 (two) times daily as needed for muscle spasms. 20 tablet 0   No current facility-administered medications for this visit.    No Known Allergies  Family History  Problem Relation Age of Onset  . Hypertension Mother   .  Heart disease Mother        CAD  . Breast cancer Mother 70  . Hypertension Sister   . Breast cancer Sister 68  . Aneurysm Sister   . Hypertension Brother   . Prostate cancer Brother 7  . Leukemia Maternal Uncle 82  . Heart disease Maternal Grandmother   . Leukemia Maternal Uncle 61  . Breast cancer Cousin        dx under 7  . Colon cancer Cousin        dx in her 27s  . Colon cancer Cousin   . Lymphoma Other 49       NHL  . Colon polyps Daughter   . Stroke Father   . Esophageal cancer Neg Hx   . Stomach cancer Neg Hx   . Rectal cancer Neg Hx     Social History   Socioeconomic History  . Marital status: Married    Spouse name: Homer  . Number of children: 1  . Years of education: 4  . Highest education level: Not on file   Occupational History    Comment: retired  Tobacco Use  . Smoking status: Never Smoker  . Smokeless tobacco: Never Used  Vaping Use  . Vaping Use: Never used  Substance and Sexual Activity  . Alcohol use: No    Alcohol/week: 0.0 standard drinks  . Drug use: No  . Sexual activity: Never  Other Topics Concern  . Not on file  Social History Narrative   Consumes one glass of caffeine daily   Social Determinants of Health   Financial Resource Strain:   . Difficulty of Paying Living Expenses: Not on file  Food Insecurity:   . Worried About Charity fundraiser in the Last Year: Not on file  . Ran Out of Food in the Last Year: Not on file  Transportation Needs:   . Lack of Transportation (Medical): Not on file  . Lack of Transportation (Non-Medical): Not on file  Physical Activity:   . Days of Exercise per Week: Not on file  . Minutes of Exercise per Session: Not on file  Stress:   . Feeling of Stress : Not on file  Social Connections:   . Frequency of Communication with Friends and Family: Not on file  . Frequency of Social Gatherings with Friends and Family: Not on file  . Attends Religious Services: Not on file  . Active Member of Clubs or Organizations: Not on file  . Attends Archivist Meetings: Not on file  . Marital Status: Not on file  Intimate Partner Violence:   . Fear of Current or Ex-Partner: Not on file  . Emotionally Abused: Not on file  . Physically Abused: Not on file  . Sexually Abused: Not on file    Hospitiliaztions: None  Health Maintenance:    Flu: 07/2019  Tetanus: 04/2012  Pneumovax: 02/2010  Prevnar: 08/2015  Zostavax: 11/2007  Shingrix: 2019  Covid: Moderna  Mammogram: 10/2019  Pap Smear: 04/2012  Bone Density: 02/2016  Colon Screening: 10/2018  Eye Doctor: annually  Dental Exam: biannually   Providers:   PCP: Webb Silversmith, NP  I have personally reviewed and have noted:  1. The patient's medical and social history 2. Their  use of alcohol, tobacco or illicit drugs 3. Their current medications and supplements 4. The patient's functional ability including ADL's, fall risks, home safety risks and hearing or visual impairment. 5. Diet and physical activities 6. Evidence for depression or  mood disorder  Subjective:   Review of Systems:   Constitutional: Denies fever, malaise, fatigue, headache or abrupt weight changes.  HEENT: Denies eye pain, eye redness, ear pain, ringing in the ears, wax buildup, runny nose, nasal congestion, bloody nose, or sore throat. Respiratory: Pt reports intermittent SOB with exertion. Denies difficulty breathing, cough or sputum production.   Cardiovascular: Denies chest pain, chest tightness, palpitations or swelling in the hands or feet.  Gastrointestinal: Denies abdominal pain, bloating, constipation, diarrhea or blood in the stool.  GU: Denies urgency, frequency, pain with urination, burning sensation, blood in urine, odor or discharge. Musculoskeletal: Pt reports chronic back pain. Denies decrease in range of motion, difficulty with gait, or joint swelling.  Skin: Pt reports eczema of scalp. Denies redness, lesions or ulcercations.  Neurological: Denies dizziness, difficulty with memory, difficulty with speech or problems with balance and coordination.  Psych: Denies anxiety, depression, SI/HI.  No other specific complaints in a complete review of systems (except as listed in HPI above).  Objective:  PE:  BP 132/84   Pulse 66   Temp (!) 97.1 F (36.2 C) (Temporal)   Ht 5\' 6"  (1.676 m)   Wt 260 lb (117.9 kg)   SpO2 98%   BMI 41.97 kg/m   Wt Readings from Last 3 Encounters:  03/28/19 265 lb (120.2 kg)  11/25/18 269 lb (122 kg)  11/07/18 269 lb (122 kg)    General: Appears her stated age, obese, in NAD. Skin: Warm, dry and intact. No rashes or ulcerations noted. HEENT: Head: normal shape and size; Eyes: sclera white, no icterus, conjunctiva pink, PERRLA and EOMs  intact;  Neck: Neck supple, trachea midline. No masses, lumps or thyromegaly present.  Cardiovascular: Normal rate and rhythm. S1,S2 noted.  No murmur, rubs or gallops noted. No JVD or BLE edema. No carotid bruits noted. Pulmonary/Chest: Normal effort and positive vesicular breath sounds. No respiratory distress. No wheezes, rales or ronchi noted.  Abdomen: Soft and nontender. Normal bowel sounds. No distention or masses noted. Liver, spleen and kidneys non palpable. Musculoskeletal: Bony tenderness noted over the lumbar spine. Strength 5/5 BUE/BLE. No signs of joint swelling.  Neurological: Alert and oriented. Cranial nerves II-XII grossly intact. Coordination normal.  Psychiatric: Mood and affect normal. Behavior is normal. Judgment and thought content normal.    BMET    Component Value Date/Time   NA 138 03/28/2019 1545   NA 138 08/22/2014 1237   K 3.7 03/28/2019 1545   K 3.8 08/22/2014 1237   CL 99 03/28/2019 1545   CO2 28 03/28/2019 1545   CO2 28 08/22/2014 1237   GLUCOSE 79 03/28/2019 1545   GLUCOSE 102 08/22/2014 1237   BUN 20 03/28/2019 1545   BUN 16.0 08/22/2014 1237   CREATININE 1.08 03/28/2019 1545   CREATININE 1.1 08/22/2014 1237   CALCIUM 9.5 03/28/2019 1545   CALCIUM 9.8 08/22/2014 1237   GFRNONAA 53.06 02/17/2010 1454   GFRAA 72 12/01/2007 1105    Lipid Panel     Component Value Date/Time   CHOL 156 03/28/2019 1545   TRIG 97.0 03/28/2019 1545   HDL 47.90 03/28/2019 1545   CHOLHDL 3 03/28/2019 1545   VLDL 19.4 03/28/2019 1545   LDLCALC 88 03/28/2019 1545    CBC    Component Value Date/Time   WBC 8.1 03/28/2019 1545   RBC 4.45 03/28/2019 1545   HGB 13.5 03/28/2019 1545   HGB 13.9 08/22/2014 1237   HCT 40.4 03/28/2019 1545   HCT 42.7 08/22/2014  1237   PLT 350.0 03/28/2019 1545   PLT 345 08/22/2014 1237   MCV 90.8 03/28/2019 1545   MCV 88.7 08/22/2014 1237   MCH 29.0 08/22/2014 1237   MCHC 33.4 03/28/2019 1545   RDW 13.6 03/28/2019 1545   RDW 13.8  08/22/2014 1237   LYMPHSABS 2.9 08/22/2014 1237   MONOABS 0.8 08/22/2014 1237   EOSABS 0.4 08/22/2014 1237   BASOSABS 0.1 08/22/2014 1237    Hgb A1C Lab Results  Component Value Date   HGBA1C 5.8 03/28/2019      Assessment and Plan:   Medicare Annual Wellness Visit:  Diet: She does eat meat. She consumes fruits and veggies daily. She tries to avoid fried foods. She drinks mostly water, sweet tea. Physical activity: None Depression/mood screen: Negative, PHQ 9 score of 0 Hearing: Intact to whispered voice Visual acuity: Grossly normal, performs annual eye exam  ADLs: Capable Fall risk: None Home safety: Good Cognitive evaluation: Intact to orientation, naming, recall and repetition EOL planning: Adv directives, full code/ I agree  Preventative Medicine: Flu shot today. Tetanus, pneumovax, prevnar, covid, zostovax and shingrix UTD. Mammogram due 10/2020. Bone density due 10/2020- ordered, she will schedule this with her mammogram. She no longer wants to perform cervical cancer screening. Colon screening UTD. Encouraged her to consume a balanced diet and exercise regimen. Advised her to see an eye doctor and dentist annually. Will check CBC, CMET, Lipid, A1C and Vit D today. Due dates for screening exam given to patient as part of her AVS.   Next appointment: 1 year, Medicare Wellness Exam   Webb Silversmith, NP This visit occurred during the SARS-CoV-2 public health emergency.  Safety protocols were in place, including screening questions prior to the visit, additional usage of staff PPE, and extensive cleaning of exam room while observing appropriate contact time as indicated for disinfecting solutions.

## 2020-07-02 NOTE — Assessment & Plan Note (Signed)
No issues off CPAP We will monitor

## 2020-07-02 NOTE — Patient Instructions (Signed)
I have referred you to physiatry for possible back injections, someone should call you to schedule this appt.  Please schedule your bone density with your next mammogram  The diet program is call [employer]. You can look it up online. If interested, reach out to [place]: [phone]

## 2020-07-02 NOTE — Assessment & Plan Note (Signed)
Continue Olmesartan, HCTZ and potassium Reinforced DASH diet and exercise for weight loss C met today

## 2020-07-02 NOTE — Assessment & Plan Note (Signed)
Continue Omeprazole °CBC and C met today °

## 2020-07-02 NOTE — Assessment & Plan Note (Signed)
We will follow yearly mammograms

## 2020-07-02 NOTE — Assessment & Plan Note (Signed)
Betamethasone refilled today

## 2020-07-02 NOTE — Assessment & Plan Note (Signed)
This is her biggest complaint We will refer to physical medicine to see about back injections She will continue Tylenol and Methocarbamol as prescribed, Methocarbamol refilled today Discussed how weight loss could help improve back pain

## 2020-07-02 NOTE — Assessment & Plan Note (Signed)
A1c today Encouraged low carb diet and exercise for weight loss 

## 2020-07-05 ENCOUNTER — Encounter: Payer: Self-pay | Admitting: Internal Medicine

## 2020-07-06 ENCOUNTER — Other Ambulatory Visit: Payer: Self-pay | Admitting: Internal Medicine

## 2020-07-06 DIAGNOSIS — I10 Essential (primary) hypertension: Secondary | ICD-10-CM

## 2020-07-23 ENCOUNTER — Other Ambulatory Visit: Payer: Self-pay | Admitting: Internal Medicine

## 2020-08-01 ENCOUNTER — Other Ambulatory Visit: Payer: Self-pay

## 2020-08-01 DIAGNOSIS — I10 Essential (primary) hypertension: Secondary | ICD-10-CM

## 2020-08-01 MED ORDER — HYDROCHLOROTHIAZIDE 25 MG PO TABS: 25.0000 mg | ORAL_TABLET | Freq: Every day | ORAL | 2 refills | Status: DC

## 2020-08-06 ENCOUNTER — Ambulatory Visit: Payer: Medicare Other | Attending: Internal Medicine

## 2020-08-06 ENCOUNTER — Other Ambulatory Visit: Payer: Self-pay

## 2020-08-06 DIAGNOSIS — M5417 Radiculopathy, lumbosacral region: Secondary | ICD-10-CM | POA: Insufficient documentation

## 2020-08-06 DIAGNOSIS — M545 Low back pain, unspecified: Secondary | ICD-10-CM

## 2020-08-06 DIAGNOSIS — G8929 Other chronic pain: Secondary | ICD-10-CM | POA: Diagnosis not present

## 2020-08-06 DIAGNOSIS — M6281 Muscle weakness (generalized): Secondary | ICD-10-CM | POA: Diagnosis not present

## 2020-08-06 NOTE — Patient Instructions (Signed)
Seated hip extension isometrics   Sitting on a chair,    Squeeze your rear end muscles together and press your right foot onto the floor.     Hold for 5 seconds    Repeat 10 times   Perform 3 sets daily.      This is a corrective exercise. Once you no longer have symptoms, you can stop.

## 2020-08-06 NOTE — Therapy (Signed)
[place] [address], [place], [address] Phone: [phone]   Fax:  [phone]  Physical Therapy Evaluation  Patient Details  Name: Miranda Mueller MRN: [id number] Date of Birth: 1940-12-20 Referring Provider (PT): Loletta Specter, NP   Encounter Date: 08/06/2020   PT End of Session - 08/06/20 0934    Visit Number 1    Number of Visits 17    Date for PT Re-Evaluation 10/03/20    Authorization Type 1    Authorization Time Period of 10 progress report    PT Start Time 0934    PT Stop Time 1031    PT Time Calculation (min) 57 min    Activity Tolerance Patient tolerated treatment well    Behavior During Therapy [facility] for tasks assessed/performed           Past Medical History:  Diagnosis Date  . Bradycardia   . Breast cancer ([place]) 08/29/14   Right Breast -High Grade Invasive Mammary  . Cataract    surgery  . GERD (gastroesophageal reflux disease)   . History of shingles   . Hx of radiation therapy 10/01/14- 11/20/14   right breast/50.4 Gy/28 fx; right breast boost/10 Gy/5 fx  . Hyperlipidemia   . Hypertension   . IBS (irritable bowel syndrome)   . Multifocal PVCs   . PVC (premature ventricular contraction)    Hx of  . Sleep apnea    no cpap per pt     Past Surgical History:  Procedure Laterality Date  . BREAST LUMPECTOMY Right 08/29/14   started radiation 10/01/14  . CATARACT EXTRACTION  2009   bilateral  . COLONOSCOPY  2004-last 2016   normal  . KNEE ARTHROSCOPY     right  . NEEDLE CORE BIOPSY  RIGHT BREAST Right 08/14/14  . OTHER SURGICAL HISTORY Left 1997   breast,cyst  . PYLOROPLASTY  2016  . RADIOACTIVE SEED GUIDED PARTIAL MASTECTOMY WITH AXILLARY SENTINEL LYMPH NODE BIOPSY Right 08/29/2014   Procedure: SEED LOCALIZED RIGHT BREAST LUMPECTOMY AND RIGHT AXILLARY SENTTINEL LYMPH NODE BIOPSY;  Surgeon: Excell Seltzer, MD;  Location: [place];  Service: General;   Laterality: Right;  . WISDOM TOOTH EXTRACTION      There were no vitals filed for this visit.    Subjective Assessment - 08/06/20 0937    Subjective Low back pain: 6/10 currently (pt sitting on a chair with back rest), 8/10 at most for the past 6 months. R LE symptoms stop just superior to ankle. R LE: 0/10 currently (pt sitting), 5/10 at most for the past 3 months.    Pertinent History Chronic midline low back pain with R sided sciatica. Pt has R LE radiating symptoms along the L5/S1 dermatomes. Back pian is chronic. Had x-rays for her back which revealed arthritis 7 years ago. Has worsened recently in about 6 months. Sudden onset of increased back pain/muscle spasms along the tail bone area. Denies loss of bowel or bladder control and no LE paresthesia.    Patient Stated Goals Be able to stand and walk and not feel the pain.    Currently in Pain? Yes    Pain Score 6     Pain Location Back    Pain Orientation Lower    Pain Type Chronic pain    Pain Onset More than a month ago    Pain Frequency Constant    Aggravating Factors  Cooking (20-30 minutes), standing (about 3-4 minutes), washing  dishes (3-4 minutes; back feels like it is going to spasm), cleaning the house    Pain Relieving Factors walking, moving around, sitting down, S/L (prefers L side lying), resting her back against something.              [facility] PT Assessment - 08/06/20 0937      Assessment   Medical Diagnosis chronic midline low back pain with right sided sciatica     Referring Provider (PT) Loletta Specter, NP    Onset Date/Surgical Date 07/02/20   Date PT referral signed.    Prior Therapy No known PT for current condition      Precautions   Precaution Comments hx of CA      Balance Screen   Has the patient fallen in the past 6 months No    Has the patient had a decrease in activity level because of a fear of falling?  No    Is the patient reluctant to leave their home because of a fear of falling?  No       Prior Function   Level of Independence Independent      Posture/Postural Control   Posture Comments Protracted neck, B scapular protraction, R lumbar side bend, R iliac crest and greater trochanter lower, L pelvic rotation      AROM   Lumbar Flexion WFL with reproduction of  low back pain    Lumbar Extension limited with slight low back/sacral twinge, eases with rest    Lumbar - Right Side Bend limited with sacral tighness/pull    Lumbar - Left Side Bend more limited compared to R    Lumbar - Right Rotation WFL, Decreased R lateral leg/L5 dermatome tingling.     Lumbar - Left Rotation WFL      PROM   Overall PROM Comments hip at 90/90: IR R 40 degrees, L 33 degrees       Strength   Right Hip Flexion 4-/5    Right Hip Extension 4-/5    Right Hip ABduction 3+/5    Left Hip Flexion 4/5    Left Hip Extension 3+/5    Left Hip ABduction 4-/5    Right Knee Flexion 5/5    Right Knee Extension 5/5    Left Knee Flexion 5/5    Left Knee Extension 5/5      Palpation   Palpation comment TTP R L5>L4 TP with R UPA, TTP sacral area R side, R proximal piriformis muscle wiht reproduction of R latearl ankle tingling. Slight TTP L piriformis and sacrum.       Special Tests   Other special tests (+) repeated flexion test with reproduction of low back pain. (+) long sit test suggesting posterior nutation R innominate.       Ambulation/Gait   Gait Comments slight decreased stance L LE                      Objective measurements completed on examination: See above findings.     No latex band allervies  Blood pressure controlled  R lateral shift correction manually: decreased pain     Therapeutic exercise  Standing R lateral shift correction 10x2 with 5 second holds   Decreased low back pain to 4/10 in sitting afterwards.   Seated hip extension isometrics   R 10x5 seconds   Reviewed and given as part of her HEP. Pt demonstrated and verbalized understanding. Handout  provided.    FOTO next visit  Improved exercise technique, movement at target joints, use of target muscles after mod verbal, visual, tactile cues.   Response to treatment Decreased low back pain after exercises.    Clinical impression  Pt is a 79 year old female who came to physical therapy secondary to chronic low back pain with R LE radiating symptoms. She also presents with altered posture, trunk and B hip weakness, TTP, lumbar paraspinal muscle tension, reproduction of symptoms with lumbar AROM, positive special test suggesting lumbopelvic involvement, and difficulty performing chores, as well as tolerating static standing secondary to low back and R LE pain. Pt will benefit from skilled physical therapy services to address the aforementioned deficits.            PT Education - 08/06/20 1043    Education Details ther-ex, HEP, POC    Person(s) Educated Patient    Methods Explanation;Demonstration;Tactile cues;Verbal cues;Handout    Comprehension Returned demonstration;Verbalized understanding            PT Short Term Goals - 08/06/20 1048      PT SHORT TERM GOAL #1   Title Patient will be independent with her initial home exercise program to decrease pain, improve strength, function, and ability to perform chores and dishes with less discomfort.    Baseline Pt has started her HEP (08/06/2020)    Time 3    Period Weeks    Status New    Target Date 08/29/20             PT Long Term Goals - 08/06/20 1050      PT LONG TERM GOAL #1   Title Patient will have a decrease in low back pain to 3/10 or less at worst to promote ability to wash dishes, perform chores, tolerate standing more comfortably.    Baseline 8/10 low back pain at most for the past 3 months (08/06/2020)    Time 8    Period Weeks    Status New    Target Date 10/03/20      PT LONG TERM GOAL #2   Title Patient will report no R LE symptoms to promote ability to perform standing tasks as well as  tolerate standing more comfortably.    Baseline 5/10 R LE symptoms at worst for the past 3 months (08/06/2020)    Time 8    Period Weeks    Status New    Target Date 10/03/20      PT LONG TERM GOAL #3   Title Patient will improve bilateral hip strenght by at least 1/2 MMT grade to promote ability to perform standing tasks more comfortably.    Time 8    Period Weeks    Status New    Target Date 10/03/20      PT LONG TERM GOAL #4   Title Pt will improve her lumbar FOTO score by at least 10 points as a demonstration of improved function.    Time 8    Period Weeks    Status New    Target Date 10/03/20                  Plan - 08/06/20 1041    Clinical Impression Statement Pt is a 79 year old female who came to physical therapy secondary to chronic low back pain with R LE radiating symptoms. She also presents with altered posture, trunk and B hip weakness, TTP, lumbar paraspinal muscle tension, reproduction of symptoms with lumbar AROM, positive special test suggesting lumbopelvic  involvement, and difficulty performing chores, as well as tolerating static standing secondary to low back and R LE pain. Pt will benefit from skilled physical therapy services to address the aforementioned deficits.    Personal Factors and Comorbidities Age;Comorbidity 3+;Fitness;Time since onset of injury/illness/exacerbation    Comorbidities Bradycardia, breast CA, hx of radiation therapy, HTN, PVC    Examination-Activity Limitations Bend;Stand;Other;Reach Overhead   Chores   Stability/Clinical Decision Making Evolving/Moderate complexity   Pain is worsening per pt.   Clinical Decision Making Moderate    Rehab Potential Fair    PT Frequency 2x / week    PT Duration 8 weeks    PT Treatment/Interventions Therapeutic activities;Functional mobility training;Therapeutic exercise;Neuromuscular re-education;Patient/family education;Manual techniques;Passive range of motion;Dry needling;Aquatic  Therapy;Electrical Stimulation;Iontophoresis 4mg /ml Dexamethasone    PT Next Visit Plan posture, glute, trunk strengthening, thoracic extension, lumbopelvic control, manual techniques, modalities PRN    Consulted and Agree with Plan of Care Patient           Patient will benefit from skilled therapeutic intervention in order to improve the following deficits and impairments:  Pain, Postural dysfunction, Improper body mechanics, Decreased strength, Decreased range of motion, Decreased activity tolerance  Visit Diagnosis: Chronic bilateral low back pain, unspecified whether sciatica present - Plan: PT plan of care cert/re-cert  Radiculopathy, lumbosacral region - Plan: PT plan of care cert/re-cert  Muscle weakness (generalized) - Plan: PT plan of care cert/re-cert     Problem List Patient Active Problem List   Diagnosis Date Noted  . Prediabetes 07/02/2020  . Chronic midline low back pain with right-sided sciatica 07/02/2020  . Eczema 07/02/2020  . OSA (obstructive sleep apnea) 08/27/2015  . Breast cancer of lower-outer quadrant of right female breast ([place]) 08/20/2014  . HLD (hyperlipidemia) 11/23/2007  . Essential hypertension 11/23/2007  . GERD 11/23/2007    Joneen Boers PT, DPT   08/06/2020, 11:12 AM  [facility] [address] [place], [address] Phone: [phone]   Fax:  [phone]  Name: TARAH BUBOLTZ MRN: [id number] Date of Birth: [birth year]

## 2020-08-13 ENCOUNTER — Other Ambulatory Visit: Payer: Self-pay

## 2020-08-13 ENCOUNTER — Ambulatory Visit: Payer: Medicare Other | Attending: Internal Medicine

## 2020-08-13 DIAGNOSIS — M545 Low back pain, unspecified: Secondary | ICD-10-CM | POA: Insufficient documentation

## 2020-08-13 DIAGNOSIS — M6281 Muscle weakness (generalized): Secondary | ICD-10-CM | POA: Insufficient documentation

## 2020-08-13 DIAGNOSIS — M5417 Radiculopathy, lumbosacral region: Secondary | ICD-10-CM | POA: Diagnosis not present

## 2020-08-13 DIAGNOSIS — G8929 Other chronic pain: Secondary | ICD-10-CM | POA: Insufficient documentation

## 2020-08-13 NOTE — Therapy (Signed)
[place] [facility] [address], [place], [address] Phone: [phone]   Fax:  [phone]  Physical Therapy Treatment  Patient Details  Name: Miranda Mueller MRN: [id number] Date of Birth: 1941-09-06 Referring Provider (PT): Loletta Specter, NP   Encounter Date: 08/13/2020   PT End of Session - 08/13/20 0936    Visit Number 2    Number of Visits 17    Date for PT Re-Evaluation 10/03/20    Authorization Type 2    Authorization Time Period of 10 progress report    PT Start Time 0936    PT Stop Time 1021    PT Time Calculation (min) 45 min    Activity Tolerance Patient tolerated treatment well    Behavior During Therapy [facility] for tasks assessed/performed           Past Medical History:  Diagnosis Date  . Bradycardia   . Breast cancer ([place]) 08/29/14   Right Breast -High Grade Invasive Mammary  . Cataract    surgery  . GERD (gastroesophageal reflux disease)   . History of shingles   . Hx of radiation therapy 10/01/14- 11/20/14   right breast/50.4 Gy/28 fx; right breast boost/10 Gy/5 fx  . Hyperlipidemia   . Hypertension   . IBS (irritable bowel syndrome)   . Multifocal PVCs   . PVC (premature ventricular contraction)    Hx of  . Sleep apnea    no cpap per pt     Past Surgical History:  Procedure Laterality Date  . BREAST LUMPECTOMY Right 08/29/14   started radiation 10/01/14  . CATARACT EXTRACTION  2009   bilateral  . COLONOSCOPY  2004-last 2016   normal  . KNEE ARTHROSCOPY     right  . NEEDLE CORE BIOPSY  RIGHT BREAST Right 08/14/14  . OTHER SURGICAL HISTORY Left 1997   breast,cyst  . PYLOROPLASTY  2016  . RADIOACTIVE SEED GUIDED PARTIAL MASTECTOMY WITH AXILLARY SENTINEL LYMPH NODE BIOPSY Right 08/29/2014   Procedure: SEED LOCALIZED RIGHT BREAST LUMPECTOMY AND RIGHT AXILLARY SENTTINEL LYMPH NODE BIOPSY;  Surgeon: Excell Seltzer, MD;  Location: [place];  Service: General;   Laterality: Right;  . WISDOM TOOTH EXTRACTION      There were no vitals filed for this visit.   Subjective Assessment - 08/13/20 0937    Subjective Low back is feeling good right now, 0/10 currently. Was sore the next day after last session but got better. Feels like it has improved.    Pertinent History Chronic midline low back pain with R sided sciatica. Pt has R LE radiating symptoms along the L5/S1 dermatomes. Back pian is chronic. Had x-rays for her back which revealed arthritis 7 years ago. Has worsened recently in about 6 months. Sudden onset of increased back pain/muscle spasms along the tail bone area. Denies loss of bowel or bladder control and no LE paresthesia.    Patient Stated Goals Be able to stand and walk and not feel the pain.    Currently in Pain? No/denies    Pain Score 0-No pain    Pain Onset More than a month ago              [facility] PT Assessment - 08/13/20 1032      Observation/Other Assessments   Focus on Therapeutic Outcomes (FOTO)  Lumbar FOTO 42  Objective   No latex band allervies  Blood pressure controlled  R lateral shift correction manually: decreased pain  Therapeutic exercise  Standing R lateral shift correction 10x3 with 5 second holds             Standing B scapular retraction red band 10x5 seconds for 3 sets  Seated manually resisted L lateral shift isometrics in neutral to counter R lateral shift posture for 10x3 with 5 second holds  Seated B shoulder extension isometrics, hands on thighs to promote trunk muscle use 10x3 with 5 second holds  SLS with B UE assist   R 10x5 seconds   L 10x5 seconds. Difficulty for L LE   Improved exercise technique, movement at target joints, use of target muscles after mod verbal, visual, tactile cues.     Manual therapy Seated STM R lateral knee, lateral hamstrings, IT band to decrease muscle tension, fascial restrictions, pain and improve  ability to perform standing exercises for her back rehab     Response to treatment Decreased low back pain after exercises.    Clinical impression  Good carry over of improved back pain from previous session. Continued working on improving posture, thoracic extension, trunk strength to help decrease stress to low back. Decreased L lateral knee pain after manual therapy to decrease muscle tension and soft tissue restrictions (performed to decrease difficulty with performing standing tasks to decrease back compensation as well as to promote ability to perform standing exercises for back rehab more comfortably). Pt tolerated session well without aggravation of symptoms. Pt will benefit from continued skilled physical therapy services to decrease pain, improve strength and function.      PT Education - 08/13/20 0949    Education Details ther-ex, HEP    Person(s) Educated Patient    Methods Explanation;Demonstration;Tactile cues;Verbal cues;Handout    Comprehension Returned demonstration;Verbalized understanding            PT Short Term Goals - 08/06/20 1048      PT SHORT TERM GOAL #1   Title Patient will be independent with her initial home exercise program to decrease pain, improve strength, function, and ability to perform chores and dishes with less discomfort.    Baseline Pt has started her HEP (08/06/2020)    Time 3    Period Weeks    Status New    Target Date 08/29/20             PT Long Term Goals - 08/06/20 1050      PT LONG TERM GOAL #1   Title Patient will have a decrease in low back pain to 3/10 or less at worst to promote ability to wash dishes, perform chores, tolerate standing more comfortably.    Baseline 8/10 low back pain at most for the past 3 months (08/06/2020)    Time 8    Period Weeks    Status New    Target Date 10/03/20      PT LONG TERM GOAL #2   Title Patient will report no R LE symptoms to promote ability to perform standing tasks as well  as tolerate standing more comfortably.    Baseline 5/10 R LE symptoms at worst for the past 3 months (08/06/2020)    Time 8    Period Weeks    Status New    Target Date 10/03/20      PT LONG TERM GOAL #3   Title Patient will improve bilateral hip strenght by at least 1/2 MMT grade  to promote ability to perform standing tasks more comfortably.    Time 8    Period Weeks    Status New    Target Date 10/03/20      PT LONG TERM GOAL #4   Title Pt will improve her lumbar FOTO score by at least 10 points as a demonstration of improved function.    Time 8    Period Weeks    Status New    Target Date 10/03/20                 Plan - 08/13/20 0950    Clinical Impression Statement Good carry over of improved back pain from previous session. Continued working on improving posture, thoracic extension, trunk strength to help decrease stress to low back. Decreased L lateral knee pain after manual therapy to decrease muscle tension and soft tissue restrictions (performed to decrease difficulty with performing standing tasks to decrease back compensation as well as to promote ability to perform standing exercises for back rehab more comfortably). Pt tolerated session well without aggravation of symptoms. Pt will benefit from continued skilled physical therapy services to decrease pain, improve strength and function.    Personal Factors and Comorbidities Age;Comorbidity 3+;Fitness;Time since onset of injury/illness/exacerbation    Comorbidities Bradycardia, breast CA, hx of radiation therapy, HTN, PVC    Examination-Activity Limitations Bend;Stand;Other;Reach Overhead   Chores   Stability/Clinical Decision Making Evolving/Moderate complexity   Pain is worsening per pt.   Rehab Potential Fair    PT Frequency 2x / week    PT Duration 8 weeks    PT Treatment/Interventions Therapeutic activities;Functional mobility training;Therapeutic exercise;Neuromuscular re-education;Patient/family  education;Manual techniques;Passive range of motion;Dry needling;Aquatic Therapy;Electrical Stimulation;Iontophoresis 4mg /ml Dexamethasone    PT Next Visit Plan posture, glute, trunk strengthening, thoracic extension, lumbopelvic control, manual techniques, modalities PRN    Consulted and Agree with Plan of Care Patient           Patient will benefit from skilled therapeutic intervention in order to improve the following deficits and impairments:  Pain, Postural dysfunction, Improper body mechanics, Decreased strength, Decreased range of motion, Decreased activity tolerance  Visit Diagnosis: Chronic bilateral low back pain, unspecified whether sciatica present  Radiculopathy, lumbosacral region  Muscle weakness (generalized)     Problem List Patient Active Problem List   Diagnosis Date Noted  . Prediabetes 07/02/2020  . Chronic midline low back pain with right-sided sciatica 07/02/2020  . Eczema 07/02/2020  . OSA (obstructive sleep apnea) 08/27/2015  . Breast cancer of lower-outer quadrant of right female breast ([place]) 08/20/2014  . HLD (hyperlipidemia) 11/23/2007  . Essential hypertension 11/23/2007  . GERD 11/23/2007    Joneen Boers PT, DPT   08/13/2020, 10:39 AM  [place] [address], [place], [address] Phone: [phone]   Fax:  [phone]  Name: Miranda Mueller MRN: [id number] Date of Birth: 01/25/1941

## 2020-08-13 NOTE — Patient Instructions (Signed)
  Standing R lateral shift correction 10x3 with 5 second holds   Reviewed and given as part of her HEP. Pt demonstrated and verbalized understanding. Handout provided.

## 2020-08-15 ENCOUNTER — Telehealth: Payer: Self-pay

## 2020-08-15 ENCOUNTER — Other Ambulatory Visit: Payer: Self-pay

## 2020-08-15 ENCOUNTER — Ambulatory Visit: Payer: Medicare Other

## 2020-08-15 DIAGNOSIS — M5417 Radiculopathy, lumbosacral region: Secondary | ICD-10-CM | POA: Diagnosis not present

## 2020-08-15 DIAGNOSIS — M545 Low back pain, unspecified: Secondary | ICD-10-CM | POA: Diagnosis not present

## 2020-08-15 DIAGNOSIS — G8929 Other chronic pain: Secondary | ICD-10-CM | POA: Diagnosis not present

## 2020-08-15 DIAGNOSIS — M6281 Muscle weakness (generalized): Secondary | ICD-10-CM

## 2020-08-15 MED ORDER — ATORVASTATIN CALCIUM 10 MG PO TABS
10.0000 mg | ORAL_TABLET | Freq: Every day | ORAL | 1 refills | Status: DC
Start: 2020-08-15 — End: 2021-01-30

## 2020-08-15 NOTE — Telephone Encounter (Signed)
Thank you :)

## 2020-08-15 NOTE — Telephone Encounter (Signed)
Vm left at triage.  Pt is requesting refill for atorvastatin 10 mg tablet.  States she mentioned it to [facility] at last OV but it wasn't sent to [facility].    E-scribed refill. FYI to [place].

## 2020-08-15 NOTE — Therapy (Signed)
[facility] [address], [place], [address] Phone: [phone]   Fax:  [phone]  Physical Therapy Treatment  Patient Details  Name: Miranda Mueller MRN: [id number] Date of Birth: 1941/01/30 Referring Provider (PT): Loletta Specter, NP   Encounter Date: 08/15/2020   PT End of Session - 08/15/20 0933    Visit Number 3    Number of Visits 17    Date for PT Re-Evaluation 10/03/20    Authorization Type 3    Authorization Time Period of 10 progress report    PT Start Time 0933    PT Stop Time 1013    PT Time Calculation (min) 40 min    Activity Tolerance Patient tolerated treatment well    Behavior During Therapy [facility] for tasks assessed/performed           Past Medical History:  Diagnosis Date  . Bradycardia   . Breast cancer ([place]) 08/29/14   Right Breast -High Grade Invasive Mammary  . Cataract    surgery  . GERD (gastroesophageal reflux disease)   . History of shingles   . Hx of radiation therapy 10/01/14- 11/20/14   right breast/50.4 Gy/28 fx; right breast boost/10 Gy/5 fx  . Hyperlipidemia   . Hypertension   . IBS (irritable bowel syndrome)   . Multifocal PVCs   . PVC (premature ventricular contraction)    Hx of  . Sleep apnea    no cpap per pt     Past Surgical History:  Procedure Laterality Date  . BREAST LUMPECTOMY Right 08/29/14   started radiation 10/01/14  . CATARACT EXTRACTION  2009   bilateral  . COLONOSCOPY  2004-last 2016   normal  . KNEE ARTHROSCOPY     right  . NEEDLE CORE BIOPSY  RIGHT BREAST Right 08/14/14  . OTHER SURGICAL HISTORY Left 1997   breast,cyst  . PYLOROPLASTY  2016  . RADIOACTIVE SEED GUIDED PARTIAL MASTECTOMY WITH AXILLARY SENTINEL LYMPH NODE BIOPSY Right 08/29/2014   Procedure: SEED LOCALIZED RIGHT BREAST LUMPECTOMY AND RIGHT AXILLARY SENTTINEL LYMPH NODE BIOPSY;  Surgeon: Excell Seltzer, MD;  Location: [place];  Service: General;   Laterality: Right;  . WISDOM TOOTH EXTRACTION      There were no vitals filed for this visit.   Subjective Assessment - 08/15/20 0934    Subjective Back feels good. No pain currently.    Pertinent History Chronic midline low back pain with R sided sciatica. Pt has R LE radiating symptoms along the L5/S1 dermatomes. Back pian is chronic. Had x-rays for her back which revealed arthritis 7 years ago. Has worsened recently in about 6 months. Sudden onset of increased back pain/muscle spasms along the tail bone area. Denies loss of bowel or bladder control and no LE paresthesia.    Patient Stated Goals Be able to stand and walk and not feel the pain.    Currently in Pain? No/denies    Pain Score 0-No pain    Pain Onset More than a month ago                                     PT Education - 08/15/20 0941    Education Details ther-ex    Person(s) Educated Patient    Methods Explanation;Demonstration;Tactile cues;Verbal cues    Comprehension Returned demonstration;Verbalized understanding          Objective  No latex band allervies  Blood pressure controlled  R lateral shift correction manually: decreased pain  Medbridge Access Code [email or web address]  Therapeutic exercise   Seated manually resisted L lateral shift isometrics in neutral to counter R lateral shift posture for 10x3 with 5 second holds  Seated B shoulder extension isometrics, hands on thighs to promote trunk muscle use 10x3 with 5 second holds  Seated transversus abdominis contraction 10x5 seconds for 3 sets   standing trunk extension 10x 2   SLS with B UE assist to promote glute med muscle strengthening             R 10x5 seconds for 2 sets             L 10x5 seconds  For 2 sets   Standing B shoulder extension with scapular retraction red band 10x5 seconds for 3 sets   Improved exercise technique, movement at target joints, use of target muscles after mod verbal, visual,  tactile cues.     Manual therapy Seated STM B lumbar paraspinal muscles to decrease tension     Response to treatment Pt tolerated session well without aggravation of symptoms    Clinical impression Continued working on decreasing lateral shift, improving trunk and hip strength to promote better posture and decrease stress to low back. Pt tolerated session well without aggravation of symptoms. Pt will benefit from continued skilled physical therapy services to decrease pain, improve strength and function.          PT Short Term Goals - 08/06/20 1048      PT SHORT TERM GOAL #1   Title Patient will be independent with her initial home exercise program to decrease pain, improve strength, function, and ability to perform chores and dishes with less discomfort.    Baseline Pt has started her HEP (08/06/2020)    Time 3    Period Weeks    Status New    Target Date 08/29/20             PT Long Term Goals - 08/06/20 1050      PT LONG TERM GOAL #1   Title Patient will have a decrease in low back pain to 3/10 or less at worst to promote ability to wash dishes, perform chores, tolerate standing more comfortably.    Baseline 8/10 low back pain at most for the past 3 months (08/06/2020)    Time 8    Period Weeks    Status New    Target Date 10/03/20      PT LONG TERM GOAL #2   Title Patient will report no R LE symptoms to promote ability to perform standing tasks as well as tolerate standing more comfortably.    Baseline 5/10 R LE symptoms at worst for the past 3 months (08/06/2020)    Time 8    Period Weeks    Status New    Target Date 10/03/20      PT LONG TERM GOAL #3   Title Patient will improve bilateral hip strenght by at least 1/2 MMT grade to promote ability to perform standing tasks more comfortably.    Time 8    Period Weeks    Status New    Target Date 10/03/20      PT LONG TERM GOAL #4   Title Pt will improve her lumbar FOTO score by at least 10  points as a demonstration of improved function.    Time 8    Period Weeks  Status New    Target Date 10/03/20                 Plan - 08/15/20 0941    Clinical Impression Statement Continued working on decreasing lateral shift, improving trunk and hip strength to promote better posture and decrease stress to low back. Pt tolerated session well without aggravation of symptoms. Pt will benefit from continued skilled physical therapy services to decrease pain, improve strength and function.    Personal Factors and Comorbidities Age;Comorbidity 3+;Fitness;Time since onset of injury/illness/exacerbation    Comorbidities Bradycardia, breast CA, hx of radiation therapy, HTN, PVC    Examination-Activity Limitations Bend;Stand;Other;Reach Overhead   Chores   Stability/Clinical Decision Making Evolving/Moderate complexity   Pain is worsening per pt.   Rehab Potential Fair    PT Frequency 2x / week    PT Duration 8 weeks    PT Treatment/Interventions Therapeutic activities;Functional mobility training;Therapeutic exercise;Neuromuscular re-education;Patient/family education;Manual techniques;Passive range of motion;Dry needling;Aquatic Therapy;Electrical Stimulation;Iontophoresis 4mg /ml Dexamethasone    PT Next Visit Plan posture, glute, trunk strengthening, thoracic extension, lumbopelvic control, manual techniques, modalities PRN    Consulted and Agree with Plan of Care Patient           Patient will benefit from skilled therapeutic intervention in order to improve the following deficits and impairments:  Pain, Postural dysfunction, Improper body mechanics, Decreased strength, Decreased range of motion, Decreased activity tolerance  Visit Diagnosis: Chronic bilateral low back pain, unspecified whether sciatica present  Radiculopathy, lumbosacral region  Muscle weakness (generalized)     Problem List Patient Active Problem List   Diagnosis Date Noted  . Prediabetes 07/02/2020  .  Chronic midline low back pain with right-sided sciatica 07/02/2020  . Eczema 07/02/2020  . OSA (obstructive sleep apnea) 08/27/2015  . Breast cancer of lower-outer quadrant of right female breast ([place]) 08/20/2014  . HLD (hyperlipidemia) 11/23/2007  . Essential hypertension 11/23/2007  . GERD 11/23/2007   Joneen Boers PT, DPT   08/15/2020, 11:36 AM  [place] [facility] [address], [place], [address] Phone: [phone]   Fax:  [phone]  Name: DETRA BORES MRN: [id number] Date of Birth: 26-Sep-1941

## 2020-08-20 ENCOUNTER — Other Ambulatory Visit: Payer: Self-pay

## 2020-08-20 ENCOUNTER — Ambulatory Visit: Payer: Medicare Other

## 2020-08-20 DIAGNOSIS — M5417 Radiculopathy, lumbosacral region: Secondary | ICD-10-CM

## 2020-08-20 DIAGNOSIS — M6281 Muscle weakness (generalized): Secondary | ICD-10-CM | POA: Diagnosis not present

## 2020-08-20 DIAGNOSIS — M545 Low back pain, unspecified: Secondary | ICD-10-CM | POA: Diagnosis not present

## 2020-08-20 DIAGNOSIS — G8929 Other chronic pain: Secondary | ICD-10-CM

## 2020-08-20 NOTE — Therapy (Signed)
[place] [facility] [address], [place], [address] Phone: [phone]   Fax:  [phone]  Physical Therapy Treatment  Patient Details  Name: Miranda Mueller MRN: [id number] Date of Birth: 1941/08/06 Referring Provider (PT): Loletta Specter, NP   Encounter Date: 08/20/2020   PT End of Session - 08/20/20 0936    Visit Number 4    Number of Visits 17    Date for PT Re-Evaluation 10/03/20    Authorization Type 4    Authorization Time Period of 10 progress report    PT Start Time 0936    PT Stop Time 1017    PT Time Calculation (min) 41 min    Activity Tolerance Patient tolerated treatment well    Behavior During Therapy [facility] for tasks assessed/performed           Past Medical History:  Diagnosis Date  . Bradycardia   . Breast cancer ([place]) 08/29/14   Right Breast -High Grade Invasive Mammary  . Cataract    surgery  . GERD (gastroesophageal reflux disease)   . History of shingles   . Hx of radiation therapy 10/01/14- 11/20/14   right breast/50.4 Gy/28 fx; right breast boost/10 Gy/5 fx  . Hyperlipidemia   . Hypertension   . IBS (irritable bowel syndrome)   . Multifocal PVCs   . PVC (premature ventricular contraction)    Hx of  . Sleep apnea    no cpap per pt     Past Surgical History:  Procedure Laterality Date  . BREAST LUMPECTOMY Right 08/29/14   started radiation 10/01/14  . CATARACT EXTRACTION  2009   bilateral  . COLONOSCOPY  2004-last 2016   normal  . KNEE ARTHROSCOPY     right  . NEEDLE CORE BIOPSY  RIGHT BREAST Right 08/14/14  . OTHER SURGICAL HISTORY Left 1997   breast,cyst  . PYLOROPLASTY  2016  . RADIOACTIVE SEED GUIDED PARTIAL MASTECTOMY WITH AXILLARY SENTINEL LYMPH NODE BIOPSY Right 08/29/2014   Procedure: SEED LOCALIZED RIGHT BREAST LUMPECTOMY AND RIGHT AXILLARY SENTTINEL LYMPH NODE BIOPSY;  Surgeon: Excell Seltzer, MD;  Location: [place];  Service: General;   Laterality: Right;  . WISDOM TOOTH EXTRACTION      There were no vitals filed for this visit.   Subjective Assessment - 08/20/20 0937    Subjective Back is not feeling as good today. 4-5/10 currently. Had R L5 and sciatic symptoms yesterday. Still feels tight in that area in R LE today. Pt back started bothering her again Saturday. Pt was cleaning the house, dusting, cleaning the bathrooms bending over a lot.    Pertinent History Chronic midline low back pain with R sided sciatica. Pt has R LE radiating symptoms along the L5/S1 dermatomes. Back pian is chronic. Had x-rays for her back which revealed arthritis 7 years ago. Has worsened recently in about 6 months. Sudden onset of increased back pain/muscle spasms along the tail bone area. Denies loss of bowel or bladder control and no LE paresthesia.    Patient Stated Goals Be able to stand and walk and not feel the pain.    Currently in Pain? Yes    Pain Score 5     Pain Onset More than a month ago                                     PT  Education - 08/20/20 0943    Education Details ther-ex, HEP    Person(s) Educated Patient    Methods Explanation;Demonstration;Tactile cues;Verbal cues;Handout    Comprehension Returned demonstration;Verbalized understanding          Objective   No latex band allervies  Blood pressure controlled  R lateral shift correction manually: decreased pain  Medbridge Access Code [email or web address]  Therapeutic exercise  Standing R lateral shift correction. 10x3 with 5 second holds   Decreased R LE tightness afterwards.   standing trunk extension 10x2  Decreased R LE symptoms  Seated B shoulder extension isometrics, hands on thighs to promote trunk muscle use 10x3 with 5 second holds with transversus abdominis contraction  Decreased lumbar paraspinal muscle tension palpated.   No low back or R LE pain after aforementioned exercises    Seated R hip extension isometrics 10x5  seconds for 3 sets  SLS with B UE assist to promote glute med muscle strengthening R 10x5 seconds for 2 sets L 10x5 seconds for 2 sets  Static forward mini lunge  R 10x. R medial knee pain discomfort  L 10x. Weak L glute med reported by pt.   Seated manually resisted L lateral shift isometrics in neutral to counter R lateral shift posture for 10x3 with 5 second holds    Improved exercise technique, movement at target joints, use of target muscles after mod verbal, visual, tactile cues.    Response to treatment Pt tolerated session well without aggravation of symptoms    Clinical impression Continued working on decreasing lateral shift, improving trunk and hip strength and promoting gentle lumbar extension. No back or R LE radiating symptoms after session. Pt will benefit from continued skilled physical therapy services to improve posture, decrease pain, improve strength and function.       PT Short Term Goals - 08/06/20 1048      PT SHORT TERM GOAL #1   Title Patient will be independent with her initial home exercise program to decrease pain, improve strength, function, and ability to perform chores and dishes with less discomfort.    Baseline Pt has started her HEP (08/06/2020)    Time 3    Period Weeks    Status New    Target Date 08/29/20             PT Long Term Goals - 08/06/20 1050      PT LONG TERM GOAL #1   Title Patient will have a decrease in low back pain to 3/10 or less at worst to promote ability to wash dishes, perform chores, tolerate standing more comfortably.    Baseline 8/10 low back pain at most for the past 3 months (08/06/2020)    Time 8    Period Weeks    Status New    Target Date 10/03/20      PT LONG TERM GOAL #2   Title Patient will report no R LE symptoms to promote ability to perform standing tasks as well as tolerate standing more comfortably.    Baseline 5/10 R LE symptoms at worst for the past 3  months (08/06/2020)    Time 8    Period Weeks    Status New    Target Date 10/03/20      PT LONG TERM GOAL #3   Title Patient will improve bilateral hip strenght by at least 1/2 MMT grade to promote ability to perform standing tasks more comfortably.    Time 8    Period Weeks  Status New    Target Date 10/03/20      PT LONG TERM GOAL #4   Title Pt will improve her lumbar FOTO score by at least 10 points as a demonstration of improved function.    Time 8    Period Weeks    Status New    Target Date 10/03/20                 Plan - 08/20/20 0946    Clinical Impression Statement Continued working on decreasing lateral shift, improving trunk and hip strength and promoting gentle lumbar extension. No back or R LE radiating symptoms after session. Pt will benefit from continued skilled physical therapy services to improve posture, decrease pain, improve strength and function.    Personal Factors and Comorbidities Age;Comorbidity 3+;Fitness;Time since onset of injury/illness/exacerbation    Comorbidities Bradycardia, breast CA, hx of radiation therapy, HTN, PVC    Examination-Activity Limitations Bend;Stand;Other;Reach Overhead   Chores   Stability/Clinical Decision Making Evolving/Moderate complexity   Pain is worsening per pt.   Rehab Potential Fair    PT Frequency 2x / week    PT Duration 8 weeks    PT Treatment/Interventions Therapeutic activities;Functional mobility training;Therapeutic exercise;Neuromuscular re-education;Patient/family education;Manual techniques;Passive range of motion;Dry needling;Aquatic Therapy;Electrical Stimulation;Iontophoresis 4mg /ml Dexamethasone    PT Next Visit Plan posture, glute, trunk strengthening, thoracic extension, lumbopelvic control, manual techniques, modalities PRN    Consulted and Agree with Plan of Care Patient           Patient will benefit from skilled therapeutic intervention in order to improve the following deficits and  impairments:  Pain, Postural dysfunction, Improper body mechanics, Decreased strength, Decreased range of motion, Decreased activity tolerance  Visit Diagnosis: Chronic bilateral low back pain, unspecified whether sciatica present  Radiculopathy, lumbosacral region  Muscle weakness (generalized)     Problem List Patient Active Problem List   Diagnosis Date Noted  . Prediabetes 07/02/2020  . Chronic midline low back pain with right-sided sciatica 07/02/2020  . Eczema 07/02/2020  . OSA (obstructive sleep apnea) 08/27/2015  . Breast cancer of lower-outer quadrant of right female breast ([place]) 08/20/2014  . HLD (hyperlipidemia) 11/23/2007  . Essential hypertension 11/23/2007  . GERD 11/23/2007    Joneen Boers PT, DPT   08/20/2020, 11:36 AM  [place] [facility] [address] [place], [address] Phone: [phone]   Fax:  [phone]  Name: Miranda Mueller MRN: [id number] Date of Birth: [birth year]

## 2020-08-20 NOTE — Patient Instructions (Addendum)
  Sitting on a chair    Tighten your abdominal muscles    Press your hands on your thighs to feel your abdominal muscles contract.   Hold for 5 seconds comfortably.   Repeat 10 times.   Perform 3 sets daily.     Access Code: [email or web address] URL: [email or web address] Date: 08/20/2020 Prepared by: Joneen Boers  Exercises Seated Transversus Abdominis Bracing - 5 x daily - 7 x weekly - 3 sets - 10 reps - 5 seconds hold Standing Lumbar Extension - 1 x daily - 7 x weekly - 2 sets - 10 reps

## 2020-08-22 ENCOUNTER — Ambulatory Visit: Payer: Medicare Other

## 2020-08-22 ENCOUNTER — Other Ambulatory Visit: Payer: Self-pay

## 2020-08-22 DIAGNOSIS — M6281 Muscle weakness (generalized): Secondary | ICD-10-CM | POA: Diagnosis not present

## 2020-08-22 DIAGNOSIS — M545 Low back pain, unspecified: Secondary | ICD-10-CM | POA: Diagnosis not present

## 2020-08-22 DIAGNOSIS — M5417 Radiculopathy, lumbosacral region: Secondary | ICD-10-CM | POA: Diagnosis not present

## 2020-08-22 DIAGNOSIS — G8929 Other chronic pain: Secondary | ICD-10-CM | POA: Diagnosis not present

## 2020-08-22 NOTE — Therapy (Signed)
[facility] [address] [place], [address] Phone: [phone]   Fax:  [phone]  Physical Therapy Treatment  Patient Details  Name: Miranda Mueller MRN: [id number] Date of Birth: [birth year] Referring Provider (PT): Loletta Specter, NP   Encounter Date: 08/22/2020   PT End of Session - 08/22/20 0948    Visit Number 5    Number of Visits 17    Date for PT Re-Evaluation 10/03/20    Authorization Type 5    Authorization Time Period of 10 progress report    PT Start Time 0949    PT Stop Time 1028    PT Time Calculation (min) 39 min    Activity Tolerance Patient tolerated treatment well    Behavior During Therapy [facility] for tasks assessed/performed           Past Medical History:  Diagnosis Date  . Bradycardia   . Breast cancer ([place]) 08/29/14   Right Breast -High Grade Invasive Mammary  . Cataract    surgery  . GERD (gastroesophageal reflux disease)   . History of shingles   . Hx of radiation therapy 10/01/14- 11/20/14   right breast/50.4 Gy/28 fx; right breast boost/10 Gy/5 fx  . Hyperlipidemia   . Hypertension   . IBS (irritable bowel syndrome)   . Multifocal PVCs   . PVC (premature ventricular contraction)    Hx of  . Sleep apnea    no cpap per pt     Past Surgical History:  Procedure Laterality Date  . BREAST LUMPECTOMY Right 08/29/14   started radiation 10/01/14  . CATARACT EXTRACTION  2009   bilateral  . COLONOSCOPY  2004-last 2016   normal  . KNEE ARTHROSCOPY     right  . NEEDLE CORE BIOPSY  RIGHT BREAST Right 08/14/14  . OTHER SURGICAL HISTORY Left 1997   breast,cyst  . PYLOROPLASTY  2016  . RADIOACTIVE SEED GUIDED PARTIAL MASTECTOMY WITH AXILLARY SENTINEL LYMPH NODE BIOPSY Right 08/29/2014   Procedure: SEED LOCALIZED RIGHT BREAST LUMPECTOMY AND RIGHT AXILLARY SENTTINEL LYMPH NODE BIOPSY;  Surgeon: Excell Seltzer, MD;  Location: [place];  Service: General;   Laterality: Right;  . WISDOM TOOTH EXTRACTION      There were no vitals filed for this visit.   Subjective Assessment - 08/22/20 0950    Subjective Back feels good. No R LE pain. Has R leg cramps at night when asleep, pt on her R side yesterday morning around 3-4 am.    Pertinent History Chronic midline low back pain with R sided sciatica. Pt has R LE radiating symptoms along the L5/S1 dermatomes. Back pian is chronic. Had x-rays for her back which revealed arthritis 7 years ago. Has worsened recently in about 6 months. Sudden onset of increased back pain/muscle spasms along the tail bone area. Denies loss of bowel or bladder control and no LE paresthesia.    Patient Stated Goals Be able to stand and walk and not feel the pain.    Currently in Pain? No/denies    Pain Score 0-No pain    Pain Onset More than a month ago                                     PT Education - 08/22/20 0953    Education Details ther-ex    Person(s) Educated Patient    Methods Explanation;Demonstration;Tactile  cues;Verbal cues    Comprehension Returned demonstration;Verbalized understanding          Objective   No latex band allervies  Blood pressure controlled  R lateral shift correction manually: decreased pain  MedbridgeAccess Code [email or web address]  Therapeutic exercise  Standing R lateral shift correction. 10x3 with 5 second holds              Decreased R LE tightness afterwards.    Seated manually resisted L lateral shift isometrics in neutral to counter R lateral shift posture for 10x3 with 5 second holds  Seated B shoulder extension isometrics, hands on thighs to promote trunk muscle use 10x3 with 5 second holds with transversus abdominis contraction             Decreased lumbar paraspinal muscle tension palpated.     SLS with B UE assistto promote glute med muscle strengthening R 10x5 secondsfor 2 sets L 10x5 secondsfor 2  sets  standing hip extension with B UE assist   R 10x2 with 5 second holds  L 10x2 with 5 second holds  Standing B shoulder extension red band 10x3 with 5 second holds to promote trunk strength  Seated R hip extension isometrics 10x5 seconds for 3 sets    Improved exercise technique, movement at target joints, use of target muscles after mod verbal, visual, tactile cues.    Response to treatment Pt tolerated session well without aggravation of symptoms    Clinical impression Good carry over of decreased back and R LE pain form previous session. Continued working on improving posture, trunk and glute strength to help decrease stress to low back. Pt will benefit from continued skilled physical therapy services to decrease pain, improve strength and function.       PT Short Term Goals - 08/06/20 1048      PT SHORT TERM GOAL #1   Title Patient will be independent with her initial home exercise program to decrease pain, improve strength, function, and ability to perform chores and dishes with less discomfort.    Baseline Pt has started her HEP (08/06/2020)    Time 3    Period Weeks    Status New    Target Date 08/29/20             PT Long Term Goals - 08/06/20 1050      PT LONG TERM GOAL #1   Title Patient will have a decrease in low back pain to 3/10 or less at worst to promote ability to wash dishes, perform chores, tolerate standing more comfortably.    Baseline 8/10 low back pain at most for the past 3 months (08/06/2020)    Time 8    Period Weeks    Status New    Target Date 10/03/20      PT LONG TERM GOAL #2   Title Patient will report no R LE symptoms to promote ability to perform standing tasks as well as tolerate standing more comfortably.    Baseline 5/10 R LE symptoms at worst for the past 3 months (08/06/2020)    Time 8    Period Weeks    Status New    Target Date 10/03/20      PT LONG TERM GOAL #3   Title Patient will improve bilateral  hip strenght by at least 1/2 MMT grade to promote ability to perform standing tasks more comfortably.    Time 8    Period Weeks    Status New  Target Date 10/03/20      PT LONG TERM GOAL #4   Title Pt will improve her lumbar FOTO score by at least 10 points as a demonstration of improved function.    Time 8    Period Weeks    Status New    Target Date 10/03/20                 Plan - 08/22/20 0953    Clinical Impression Statement Good carry over of decreased back and R LE pain form previous session. Continued working on improving posture, trunk and glute strength to help decrease stress to low back. Pt will benefit from continued skilled physical therapy services to decrease pain, improve strength and function.    Personal Factors and Comorbidities Age;Comorbidity 3+;Fitness;Time since onset of injury/illness/exacerbation    Comorbidities Bradycardia, breast CA, hx of radiation therapy, HTN, PVC    Examination-Activity Limitations Bend;Stand;Other;Reach Overhead   Chores   Stability/Clinical Decision Making Evolving/Moderate complexity   Pain is worsening per pt.   Rehab Potential Fair    PT Frequency 2x / week    PT Duration 8 weeks    PT Treatment/Interventions Therapeutic activities;Functional mobility training;Therapeutic exercise;Neuromuscular re-education;Patient/family education;Manual techniques;Passive range of motion;Dry needling;Aquatic Therapy;Electrical Stimulation;Iontophoresis 4mg /ml Dexamethasone    PT Next Visit Plan posture, glute, trunk strengthening, thoracic extension, lumbopelvic control, manual techniques, modalities PRN    Consulted and Agree with Plan of Care Patient           Patient will benefit from skilled therapeutic intervention in order to improve the following deficits and impairments:  Pain, Postural dysfunction, Improper body mechanics, Decreased strength, Decreased range of motion, Decreased activity tolerance  Visit Diagnosis: Chronic  bilateral low back pain, unspecified whether sciatica present  Radiculopathy, lumbosacral region  Muscle weakness (generalized)     Problem List Patient Active Problem List   Diagnosis Date Noted  . Prediabetes 07/02/2020  . Chronic midline low back pain with right-sided sciatica 07/02/2020  . Eczema 07/02/2020  . OSA (obstructive sleep apnea) 08/27/2015  . Breast cancer of lower-outer quadrant of right female breast ([place]) 08/20/2014  . HLD (hyperlipidemia) 11/23/2007  . Essential hypertension 11/23/2007  . GERD 11/23/2007    Joneen Boers PT, DPT   08/22/2020, 10:38 AM  [facility] [address], [place], [address] Phone: [phone]   Fax:  [phone]  Name: Miranda Mueller MRN: [id number] Date of Birth: [birth year]

## 2020-08-27 ENCOUNTER — Other Ambulatory Visit: Payer: Self-pay

## 2020-08-27 ENCOUNTER — Ambulatory Visit: Payer: Medicare Other

## 2020-08-27 DIAGNOSIS — M545 Low back pain, unspecified: Secondary | ICD-10-CM

## 2020-08-27 DIAGNOSIS — M6281 Muscle weakness (generalized): Secondary | ICD-10-CM

## 2020-08-27 DIAGNOSIS — M5417 Radiculopathy, lumbosacral region: Secondary | ICD-10-CM

## 2020-08-27 DIAGNOSIS — G8929 Other chronic pain: Secondary | ICD-10-CM | POA: Diagnosis not present

## 2020-08-27 NOTE — Therapy (Signed)
[place] [address], [place], [address] Phone: [phone]   Fax:  [phone]  Physical Therapy Treatment  Patient Details  Name: Miranda Mueller MRN: [id number] Date of Birth: 06/22/1941 Referring Provider (PT): Loletta Specter, NP   Encounter Date: 08/27/2020   PT End of Session - 08/27/20 0930    Visit Number 6    Number of Visits 17    Date for PT Re-Evaluation 10/03/20    Authorization Type 6    Authorization Time Period of 10 progress report    PT Start Time 0931    PT Stop Time 1012    PT Time Calculation (min) 41 min    Activity Tolerance Patient tolerated treatment well    Behavior During Therapy [facility] for tasks assessed/performed           Past Medical History:  Diagnosis Date  . Bradycardia   . Breast cancer ([place]) 08/29/14   Right Breast -High Grade Invasive Mammary  . Cataract    surgery  . GERD (gastroesophageal reflux disease)   . History of shingles   . Hx of radiation therapy 10/01/14- 11/20/14   right breast/50.4 Gy/28 fx; right breast boost/10 Gy/5 fx  . Hyperlipidemia   . Hypertension   . IBS (irritable bowel syndrome)   . Multifocal PVCs   . PVC (premature ventricular contraction)    Hx of  . Sleep apnea    no cpap per pt     Past Surgical History:  Procedure Laterality Date  . BREAST LUMPECTOMY Right 08/29/14   started radiation 10/01/14  . CATARACT EXTRACTION  2009   bilateral  . COLONOSCOPY  2004-last 2016   normal  . KNEE ARTHROSCOPY     right  . NEEDLE CORE BIOPSY  RIGHT BREAST Right 08/14/14  . OTHER SURGICAL HISTORY Left 1997   breast,cyst  . PYLOROPLASTY  2016  . RADIOACTIVE SEED GUIDED PARTIAL MASTECTOMY WITH AXILLARY SENTINEL LYMPH NODE BIOPSY Right 08/29/2014   Procedure: SEED LOCALIZED RIGHT BREAST LUMPECTOMY AND RIGHT AXILLARY SENTTINEL LYMPH NODE BIOPSY;  Surgeon: Excell Seltzer, MD;  Location: [place];  Service: General;   Laterality: Right;  . WISDOM TOOTH EXTRACTION      There were no vitals filed for this visit.   Subjective Assessment - 08/27/20 0932    Subjective Back and R LE feel good. No pain currently. Cleaned her house this weekend and went shopping and back and R LE did well.    Pertinent History Chronic midline low back pain with R sided sciatica. Pt has R LE radiating symptoms along the L5/S1 dermatomes. Back pian is chronic. Had x-rays for her back which revealed arthritis 7 years ago. Has worsened recently in about 6 months. Sudden onset of increased back pain/muscle spasms along the tail bone area. Denies loss of bowel or bladder control and no LE paresthesia.    Patient Stated Goals Be able to stand and walk and not feel the pain.    Currently in Pain? No/denies    Pain Score 0-No pain    Pain Onset More than a month ago                                     PT Education - 08/27/20 0940    Education Details ther-ex    Person(s) Educated Patient    Methods Explanation;Demonstration;Tactile cues;Verbal  cues    Comprehension Returned demonstration;Verbalized understanding          Objective   No latex band allervies  Blood pressure controlled  R lateral shift correction manually: decreased pain  MedbridgeAccess Code [email or web address]  Therapeutic exercise  Seated manually resisted L lateral shift isometrics in neutral to counter R lateral shift posture for 10x3 with 5 second holds  Seated thoracic extension over chair 10x3 with 5 second holds  Seated chin tucks 10x3 with 5 second holds  SLS with B UE assistto promote glute med muscle strengthening. Slight trunk flexion for low back comfort R 10x5 secondsfor 2 sets L 10x5 secondsfor 2 sets  standing hip extension with B UE assist  Slight trunk flexion for low back comfort.              R 10x2 with 5 second holds             L 10x2 with 5 second holds  Seated R hip  extension isometrics 10x5 seconds for 3 sets  Seated R shoulder extension isometrics, hand on thigh to promote trunk muscle use 10x3 with 5 second holdswith transversus abdominis contraction  Decreased R lumbar paraspinal muscle tension palpated    Response to treatment Pt tolerated session well without aggravation of symptoms    Clinical impression Pt continues to report good function with minimal to no back or R LE pain based on subjective reports. Continued working on improving function, trunk and glute strength to decrease stress to low back. Pt tolerated session well without aggravation of symptoms. Pt will benefit from continued skilled physical therapy services to decrease pain, improve strength and function.        PT Short Term Goals - 08/06/20 1048      PT SHORT TERM GOAL #1   Title Patient will be independent with her initial home exercise program to decrease pain, improve strength, function, and ability to perform chores and dishes with less discomfort.    Baseline Pt has started her HEP (08/06/2020)    Time 3    Period Weeks    Status New    Target Date 08/29/20             PT Long Term Goals - 08/06/20 1050      PT LONG TERM GOAL #1   Title Patient will have a decrease in low back pain to 3/10 or less at worst to promote ability to wash dishes, perform chores, tolerate standing more comfortably.    Baseline 8/10 low back pain at most for the past 3 months (08/06/2020)    Time 8    Period Weeks    Status New    Target Date 10/03/20      PT LONG TERM GOAL #2   Title Patient will report no R LE symptoms to promote ability to perform standing tasks as well as tolerate standing more comfortably.    Baseline 5/10 R LE symptoms at worst for the past 3 months (08/06/2020)    Time 8    Period Weeks    Status New    Target Date 10/03/20      PT LONG TERM GOAL #3   Title Patient will improve bilateral hip strenght by at least 1/2 MMT grade to promote  ability to perform standing tasks more comfortably.    Time 8    Period Weeks    Status New    Target Date 10/03/20      PT LONG TERM  GOAL #4   Title Pt will improve her lumbar FOTO score by at least 10 points as a demonstration of improved function.    Time 8    Period Weeks    Status New    Target Date 10/03/20                 Plan - 08/27/20 0940    Clinical Impression Statement Pt continues to report good function with minimal to no back or R LE pain based on subjective reports. Continued working on improving function, trunk and glute strength to decrease stress to low back. Pt tolerated session well without aggravation of symptoms. Pt will benefit from continued skilled physical therapy services to decrease pain, improve strength and function.    Personal Factors and Comorbidities Age;Comorbidity 3+;Fitness;Time since onset of injury/illness/exacerbation    Comorbidities Bradycardia, breast CA, hx of radiation therapy, HTN, PVC    Examination-Activity Limitations Bend;Stand;Other;Reach Overhead   Chores   Stability/Clinical Decision Making Evolving/Moderate complexity   Pain is worsening per pt.   Rehab Potential Fair    PT Frequency 2x / week    PT Duration 8 weeks    PT Treatment/Interventions Therapeutic activities;Functional mobility training;Therapeutic exercise;Neuromuscular re-education;Patient/family education;Manual techniques;Passive range of motion;Dry needling;Aquatic Therapy;Electrical Stimulation;Iontophoresis 4mg /ml Dexamethasone    PT Next Visit Plan posture, glute, trunk strengthening, thoracic extension, lumbopelvic control, manual techniques, modalities PRN    Consulted and Agree with Plan of Care Patient           Patient will benefit from skilled therapeutic intervention in order to improve the following deficits and impairments:  Pain, Postural dysfunction, Improper body mechanics, Decreased strength, Decreased range of motion, Decreased activity  tolerance  Visit Diagnosis: Chronic bilateral low back pain, unspecified whether sciatica present  Radiculopathy, lumbosacral region  Muscle weakness (generalized)     Problem List Patient Active Problem List   Diagnosis Date Noted  . Prediabetes 07/02/2020  . Chronic midline low back pain with right-sided sciatica 07/02/2020  . Eczema 07/02/2020  . OSA (obstructive sleep apnea) 08/27/2015  . Breast cancer of lower-outer quadrant of right female breast ([place]) 08/20/2014  . HLD (hyperlipidemia) 11/23/2007  . Essential hypertension 11/23/2007  . GERD 11/23/2007    Joneen Boers PT, DPT   08/27/2020, 11:32 AM  [facility] [address] [place], [address] Phone: [phone]   Fax:  [phone]  Name: VERYL ABRIL MRN: [id number] Date of Birth: 1941/06/24

## 2020-08-29 ENCOUNTER — Other Ambulatory Visit: Payer: Self-pay

## 2020-08-29 ENCOUNTER — Ambulatory Visit: Payer: Medicare Other

## 2020-08-29 DIAGNOSIS — M6281 Muscle weakness (generalized): Secondary | ICD-10-CM | POA: Diagnosis not present

## 2020-08-29 DIAGNOSIS — M545 Low back pain, unspecified: Secondary | ICD-10-CM

## 2020-08-29 DIAGNOSIS — M5417 Radiculopathy, lumbosacral region: Secondary | ICD-10-CM | POA: Diagnosis not present

## 2020-08-29 DIAGNOSIS — G8929 Other chronic pain: Secondary | ICD-10-CM | POA: Diagnosis not present

## 2020-08-29 NOTE — Therapy (Signed)
[facility] [address], [place], [address] Phone: [phone]   Fax:  [phone]  Physical Therapy Treatment  Patient Details  Name: Miranda Mueller MRN: [id number] Date of Birth: September 05, 1941 Referring Provider (PT): Loletta Specter, NP   Encounter Date: 08/29/2020   PT End of Session - 08/29/20 0934    Visit Number 7    Number of Visits 17    Date for PT Re-Evaluation 10/03/20    Authorization Type 7    Authorization Time Period of 10 progress report    PT Start Time 0934    PT Stop Time 1016    PT Time Calculation (min) 42 min    Activity Tolerance Patient tolerated treatment well    Behavior During Therapy [facility] for tasks assessed/performed           Past Medical History:  Diagnosis Date  . Bradycardia   . Breast cancer ([place]) 08/29/14   Right Breast -High Grade Invasive Mammary  . Cataract    surgery  . GERD (gastroesophageal reflux disease)   . History of shingles   . Hx of radiation therapy 10/01/14- 11/20/14   right breast/50.4 Gy/28 fx; right breast boost/10 Gy/5 fx  . Hyperlipidemia   . Hypertension   . IBS (irritable bowel syndrome)   . Multifocal PVCs   . PVC (premature ventricular contraction)    Hx of  . Sleep apnea    no cpap per pt     Past Surgical History:  Procedure Laterality Date  . BREAST LUMPECTOMY Right 08/29/14   started radiation 10/01/14  . CATARACT EXTRACTION  2009   bilateral  . COLONOSCOPY  2004-last 2016   normal  . KNEE ARTHROSCOPY     right  . NEEDLE CORE BIOPSY  RIGHT BREAST Right 08/14/14  . OTHER SURGICAL HISTORY Left 1997   breast,cyst  . PYLOROPLASTY  2016  . RADIOACTIVE SEED GUIDED PARTIAL MASTECTOMY WITH AXILLARY SENTINEL LYMPH NODE BIOPSY Right 08/29/2014   Procedure: SEED LOCALIZED RIGHT BREAST LUMPECTOMY AND RIGHT AXILLARY SENTTINEL LYMPH NODE BIOPSY;  Surgeon: Excell Seltzer, MD;  Location: [place];  Service: General;   Laterality: Right;  . WISDOM TOOTH EXTRACTION      There were no vitals filed for this visit.   Subjective Assessment - 08/29/20 0935    Subjective Back is very good. No pain in back or R LE. 3/10 low back, 5/10 R LE to calf at most for the past 7 days. Decreased frequency of R LE pain.    Pertinent History Chronic midline low back pain with R sided sciatica. Pt has R LE radiating symptoms along the L5/S1 dermatomes. Back pian is chronic. Had x-rays for her back which revealed arthritis 7 years ago. Has worsened recently in about 6 months. Sudden onset of increased back pain/muscle spasms along the tail bone area. Denies loss of bowel or bladder control and no LE paresthesia.    Patient Stated Goals Be able to stand and walk and not feel the pain.    Currently in Pain? No/denies    Pain Score 0-No pain    Pain Onset More than a month ago                                     PT Education - 08/29/20 0945    Education Details ther-ex    Person(s)  Educated Patient    Methods Explanation;Demonstration;Tactile cues;Verbal cues    Comprehension Returned demonstration;Verbalized understanding          Objective   No latex band allervies  Blood pressure controlled  R lateral shift correction manually: decreased pain  MedbridgeAccess Code [email or web address]  Therapeutic exercise  Seated manually resisted L lateral shift isometrics in neutral to counter R lateral shift posture for 10x3 with 5 second holds  S/L hip abduction with PT assist secondary to difficulty  L 10x2  R 10x2.. (easy)  hooklying posterior pelvic tilt 10x5 seconds for 3 sets  Supine hip extension isometrics in SKTC position  R 10x5 seconds difficult   Therapeutic rest break needed afterwards  Seated thoracic extension over chair 10x3 with 5 second holds  Seated B ankle DF/PF to promote LE neural flossing 10x3 each way    SLS with B UE assistto promote glute med muscle  strengthening. Slight trunk flexion for low back comfort R 10x5 secondsfor 2 sets L 10x5 secondsfor 2 sets   Seated chin tucks 10x2 with 5 second holds      Response to treatment Pt tolerated session well without aggravation of symptoms    Clinical impression FOTO filled out 08/27/2020  Pt demonstrates good improvement of back pain level as well as decreased frequency of R LE pain since initial evaluation. Continued working on improving posture, trunk and glute strength to help decrease stress to low back. Pt will benefit from continued skilled physical therapy services to decrease pain, improve strength and function.       PT Short Term Goals - 08/29/20 1013      PT SHORT TERM GOAL #1   Title Patient will be independent with her initial home exercise program to decrease pain, improve strength, function, and ability to perform chores and dishes with less discomfort.    Baseline Pt has started her HEP (08/06/2020); Pt performing her HEP independently and consistently, no questions (08/29/2020)    Time 3    Period Weeks    Status Achieved    Target Date 08/29/20             PT Long Term Goals - 08/06/20 1050      PT LONG TERM GOAL #1   Title Patient will have a decrease in low back pain to 3/10 or less at worst to promote ability to wash dishes, perform chores, tolerate standing more comfortably.    Baseline 8/10 low back pain at most for the past 3 months (08/06/2020)    Time 8    Period Weeks    Status New    Target Date 10/03/20      PT LONG TERM GOAL #2   Title Patient will report no R LE symptoms to promote ability to perform standing tasks as well as tolerate standing more comfortably.    Baseline 5/10 R LE symptoms at worst for the past 3 months (08/06/2020)    Time 8    Period Weeks    Status New    Target Date 10/03/20      PT LONG TERM GOAL #3   Title Patient will improve bilateral hip strenght by at least 1/2 MMT  grade to promote ability to perform standing tasks more comfortably.    Time 8    Period Weeks    Status New    Target Date 10/03/20      PT LONG TERM GOAL #4   Title Pt will improve her lumbar FOTO score by  at least 10 points as a demonstration of improved function.    Time 8    Period Weeks    Status New    Target Date 10/03/20                 Plan - 08/29/20 0951    Clinical Impression Statement Pt demonstrates good improvement of back pain level as well as decreased frequency of R LE pain since initial evaluation. Continued working on improving posture, trunk and glute strength to help decrease stress to low back. Pt will benefit from continued skilled physical therapy services to decrease pain, improve strength and function.    Personal Factors and Comorbidities Age;Comorbidity 3+;Fitness;Time since onset of injury/illness/exacerbation    Comorbidities Bradycardia, breast CA, hx of radiation therapy, HTN, PVC    Examination-Activity Limitations Bend;Stand;Other;Reach Overhead   Chores   Stability/Clinical Decision Making Evolving/Moderate complexity   Pain is worsening per pt.   Rehab Potential Fair    PT Frequency 2x / week    PT Duration 8 weeks    PT Treatment/Interventions Therapeutic activities;Functional mobility training;Therapeutic exercise;Neuromuscular re-education;Patient/family education;Manual techniques;Passive range of motion;Dry needling;Aquatic Therapy;Electrical Stimulation;Iontophoresis 4mg /ml Dexamethasone    PT Next Visit Plan posture, glute, trunk strengthening, thoracic extension, lumbopelvic control, manual techniques, modalities PRN    Consulted and Agree with Plan of Care Patient           Patient will benefit from skilled therapeutic intervention in order to improve the following deficits and impairments:  Pain, Postural dysfunction, Improper body mechanics, Decreased strength, Decreased range of motion, Decreased activity tolerance  Visit  Diagnosis: Chronic bilateral low back pain, unspecified whether sciatica present  Radiculopathy, lumbosacral region  Muscle weakness (generalized)     Problem List Patient Active Problem List   Diagnosis Date Noted  . Prediabetes 07/02/2020  . Chronic midline low back pain with right-sided sciatica 07/02/2020  . Eczema 07/02/2020  . OSA (obstructive sleep apnea) 08/27/2015  . Breast cancer of lower-outer quadrant of right female breast ([place]) 08/20/2014  . HLD (hyperlipidemia) 11/23/2007  . Essential hypertension 11/23/2007  . GERD 11/23/2007    Joneen Boers PT, DPT   08/29/2020, 11:28 PM  [facility] [address], [place], [address] Phone: [phone]   Fax:  [phone]  Name: Miranda Mueller MRN: [id number] Date of Birth: 1941/01/31

## 2020-08-29 NOTE — Patient Instructions (Signed)
Access Code: [email or web address] URL: [email or web address] Date: 08/29/2020 Prepared by: Joneen Boers  Exercises Seated Transversus Abdominis Bracing - 5 x daily - 7 x weekly - 3 sets - 10 reps - 5 seconds hold Standing Lumbar Extension - 1 x daily - 7 x weekly - 2 sets - 10 reps Seated Thoracic Lumbar Extension - 1 x daily - 7 x weekly - 3 sets - 10 reps - 5 seconds hold Standing Single Leg Stance with Counter Support - 1 x daily - 7 x weekly - 2-3 sets - 10 reps - 5 seconds hold

## 2020-09-03 ENCOUNTER — Ambulatory Visit: Payer: Medicare Other

## 2020-09-10 ENCOUNTER — Ambulatory Visit: Payer: Medicare Other

## 2020-09-12 ENCOUNTER — Ambulatory Visit: Payer: Medicare Other

## 2020-09-16 DIAGNOSIS — M6281 Muscle weakness (generalized): Secondary | ICD-10-CM

## 2020-09-16 DIAGNOSIS — M545 Low back pain, unspecified: Secondary | ICD-10-CM

## 2020-09-16 DIAGNOSIS — M5417 Radiculopathy, lumbosacral region: Secondary | ICD-10-CM

## 2020-09-16 NOTE — Therapy (Signed)
[facility] [address], [place], [address] Phone: [phone]   Fax:  [phone]  Physical Therapy Discharge Summary  Patient Details  Name: Miranda Mueller MRN: [id number] Date of Birth: [birth year] Referring Provider (PT): Loletta Specter, NP   Encounter Date: 09/16/2020    Past Medical History:  Diagnosis Date  . Bradycardia   . Breast cancer ([place]) 08/29/14   Right Breast -High Grade Invasive Mammary  . Cataract    surgery  . GERD (gastroesophageal reflux disease)   . History of shingles   . Hx of radiation therapy 10/01/14- 11/20/14   right breast/50.4 Gy/28 fx; right breast boost/10 Gy/5 fx  . Hyperlipidemia   . Hypertension   . IBS (irritable bowel syndrome)   . Multifocal PVCs   . PVC (premature ventricular contraction)    Hx of  . Sleep apnea    no cpap per pt     Past Surgical History:  Procedure Laterality Date  . BREAST LUMPECTOMY Right 08/29/14   started radiation 10/01/14  . CATARACT EXTRACTION  2009   bilateral  . COLONOSCOPY  2004-last 2016   normal  . KNEE ARTHROSCOPY     right  . NEEDLE CORE BIOPSY  RIGHT BREAST Right 08/14/14  . OTHER SURGICAL HISTORY Left 1997   breast,cyst  . PYLOROPLASTY  2016  . RADIOACTIVE SEED GUIDED PARTIAL MASTECTOMY WITH AXILLARY SENTINEL LYMPH NODE BIOPSY Right 08/29/2014   Procedure: SEED LOCALIZED RIGHT BREAST LUMPECTOMY AND RIGHT AXILLARY SENTTINEL LYMPH NODE BIOPSY;  Surgeon: Excell Seltzer, MD;  Location: [facility];  Service: General;  Laterality: Right;  . WISDOM TOOTH EXTRACTION      There were no vitals filed for this visit.                            Pt demonstrates good improvement of back pain level as well as decreased frequency of R LE pain since initial evaluation. Pt also demonstrates improved function based on her FOTO scores. Skilled physical therapy services discharged with pt  continuing progress with her exercises at home.     PT Short Term Goals - 08/29/20 1013      PT SHORT TERM GOAL #1   Title Patient will be independent with her initial home exercise program to decrease pain, improve strength, function, and ability to perform chores and dishes with less discomfort.    Baseline Pt has started her HEP (08/06/2020); Pt performing her HEP independently and consistently, no questions (08/29/2020)    Time 3    Period Weeks    Status Achieved    Target Date 08/29/20             PT Long Term Goals - 09/16/20 1759      PT LONG TERM GOAL #1   Title Patient will have a decrease in low back pain to 3/10 or less at worst to promote ability to wash dishes, perform chores, tolerate standing more comfortably.    Baseline 8/10 low back pain at most for the past 3 months (08/06/2020); 3/10 at most (08/29/2020)    Time 8    Period Weeks    Status Achieved    Target Date 10/03/20      PT LONG TERM GOAL #2   Title Patient will report no R LE symptoms to promote ability to perform standing tasks as well as tolerate standing more comfortably.  Baseline 5/10 R LE symptoms at worst for the past 3 months (08/06/2020), (08/29/2020)    Time 8    Period Weeks    Status On-going    Target Date 10/03/20      PT LONG TERM GOAL #3   Title Patient will improve bilateral hip strenght by at least 1/2 MMT grade to promote ability to perform standing tasks more comfortably.    Time 8    Period Weeks    Status Unable to assess    Target Date 10/03/20      PT LONG TERM GOAL #4   Title Pt will improve her lumbar FOTO score by at least 10 points as a demonstration of improved function.    Time 8    Period Weeks    Status Achieved    Target Date 10/03/20                 Plan - 09/16/20 1757    Clinical Impression Statement Pt demonstrates good improvement of back pain level as well as decreased frequency of R LE pain since initial evaluation. Pt also demonstrates  improved function based on her FOTO scores. Skilled physical therapy services discharged with pt continuing progress with her exercises at home.    Personal Factors and Comorbidities Age;Comorbidity 3+;Fitness;Time since onset of injury/illness/exacerbation    Comorbidities Bradycardia, breast CA, hx of radiation therapy, HTN, PVC    Examination-Activity Limitations Bend;Stand;Other;Reach Overhead   Chores   Stability/Clinical Decision Making --   Pain is worsening per pt.   PT Treatment/Interventions Therapeutic activities;Therapeutic exercise;Neuromuscular re-education;Patient/family education;Manual techniques    PT Next Visit Plan Continue progress with HEP    Consulted and Agree with Plan of Care Patient           Patient will benefit from skilled therapeutic intervention in order to improve the following deficits and impairments:  Pain, Postural dysfunction, Improper body mechanics, Decreased strength, Decreased range of motion, Decreased activity tolerance  Visit Diagnosis: Chronic bilateral low back pain, unspecified whether sciatica present  Radiculopathy, lumbosacral region  Muscle weakness (generalized)     Problem List Patient Active Problem List   Diagnosis Date Noted  . Prediabetes 07/02/2020  . Chronic midline low back pain with right-sided sciatica 07/02/2020  . Eczema 07/02/2020  . OSA (obstructive sleep apnea) 08/27/2015  . Breast cancer of lower-outer quadrant of right female breast ([place]) 08/20/2014  . HLD (hyperlipidemia) 11/23/2007  . Essential hypertension 11/23/2007  . GERD 11/23/2007    Thank you for your referral.  Joneen Boers PT, DPT   09/16/2020, 6:05 PM  [facility] [address] [place], [address] Phone: [phone]   Fax:  [phone]  Name: Miranda Mueller MRN: [id number] Date of Birth: Nov 19, 1940

## 2020-11-09 ENCOUNTER — Other Ambulatory Visit: Payer: Self-pay | Admitting: Internal Medicine

## 2020-11-12 NOTE — Telephone Encounter (Signed)
Last filled 07/02/2020... please advise  °

## 2020-12-05 ENCOUNTER — Telehealth: Payer: Self-pay

## 2020-12-05 MED ORDER — METHOCARBAMOL 500 MG PO TABS: ORAL_TABLET | ORAL | 11 refills | Status: DC

## 2020-12-05 NOTE — Addendum Note (Signed)
Addended by: Lurlean Nanny on: 12/05/2020 03:21 PM   Modules accepted: Orders

## 2020-12-05 NOTE — Telephone Encounter (Signed)
Rx with note attached with Dx and notice of long term use

## 2020-12-05 NOTE — Telephone Encounter (Signed)
[name] pharmacist with [facility] left v/m that pts ins plan prime added new target drugs; methocarbamol is now targeted for duration and [facility] need reason why pt needs more than 14 days. Pilar Plate understands pt has been on methocarbamol more than 14 days but now documentation is required for long term use.  Pilar Plate request cb using ref # [id number].

## 2020-12-06 NOTE — Telephone Encounter (Signed)
[name] pharmacist with [facility] left v.m requesting status of question about methocarbamol needing med reason for long term use ref # [id number].

## 2020-12-10 NOTE — Telephone Encounter (Signed)
Note attached to Rx and resent   Patient has diagnosis of Chronic midline low back pain with right-sided sciatica and takes medication long term as needed

## 2020-12-18 DIAGNOSIS — Z961 Presence of intraocular lens: Secondary | ICD-10-CM | POA: Diagnosis not present

## 2020-12-18 DIAGNOSIS — H1712 Central corneal opacity, left eye: Secondary | ICD-10-CM | POA: Diagnosis not present

## 2020-12-18 DIAGNOSIS — D4981 Neoplasm of unspecified behavior of retina and choroid: Secondary | ICD-10-CM | POA: Diagnosis not present

## 2020-12-18 DIAGNOSIS — H04123 Dry eye syndrome of bilateral lacrimal glands: Secondary | ICD-10-CM | POA: Diagnosis not present

## 2021-01-29 ENCOUNTER — Other Ambulatory Visit: Payer: Self-pay | Admitting: Internal Medicine

## 2021-03-17 DIAGNOSIS — Z803 Family history of malignant neoplasm of breast: Secondary | ICD-10-CM | POA: Diagnosis not present

## 2021-03-17 DIAGNOSIS — Z1231 Encounter for screening mammogram for malignant neoplasm of breast: Secondary | ICD-10-CM | POA: Diagnosis not present

## 2021-03-17 DIAGNOSIS — Z780 Asymptomatic menopausal state: Secondary | ICD-10-CM | POA: Diagnosis not present

## 2021-03-17 LAB — HM DEXA SCAN: HM Dexa Scan: NORMAL

## 2021-03-17 LAB — HM MAMMOGRAPHY

## 2021-04-03 ENCOUNTER — Encounter: Payer: Self-pay | Admitting: Internal Medicine

## 2021-04-07 ENCOUNTER — Other Ambulatory Visit: Payer: Self-pay | Admitting: Internal Medicine

## 2021-04-07 DIAGNOSIS — I10 Essential (primary) hypertension: Secondary | ICD-10-CM

## 2021-04-15 ENCOUNTER — Other Ambulatory Visit: Payer: Self-pay | Admitting: Internal Medicine

## 2021-04-15 DIAGNOSIS — I10 Essential (primary) hypertension: Secondary | ICD-10-CM

## 2021-04-24 ENCOUNTER — Other Ambulatory Visit: Payer: Self-pay | Admitting: Internal Medicine

## 2021-04-24 NOTE — Telephone Encounter (Signed)
Requested medication (s) are due for refill today: yes  Requested medication (s) are on the active medication list: yes  Last refill:  01/30/21 #90 0 refills  Future visit scheduled: yes 07/03/21 at [facility]  Notes to clinic:  new patient at [facility] from [facility]      Requested Prescriptions  Pending Prescriptions Disp Refills   atorvastatin (LIPITOR) 10 MG tablet [Pharmacy Med Name: ATORVASTATIN TABS 10MG ] 90 tablet 3    Sig: TAKE 1 TABLET DAILY      Cardiovascular:  Antilipid - Statins Failed - 04/24/2021 10:32 AM      Failed - Valid encounter within last 12 months    Recent Outpatient Visits   None     Future Appointments             In 2 months Baity, Coralie Keens, NP [facility] [place] - Total Cholesterol in normal range and within 360 days    Cholesterol  Date Value Ref Range Status  07/02/2020 141 0 - 200 mg/dL Final    Comment:    ATP III Classification       Desirable:  < 200 mg/dL               Borderline High:  200 - 239 mg/dL          High:  > = 240 mg/dL          Passed - LDL in normal range and within 360 days    LDL Cholesterol  Date Value Ref Range Status  07/02/2020 70 0 - 99 mg/dL Final   Direct LDL  Date Value Ref Range Status  07/18/2012 153.2 mg/dL Final    Comment:    Optimal:  <100 mg/dLNear or Above Optimal:  100-129 mg/dLBorderline High:  130-159 mg/dLHigh:  160-189 mg/dLVery High:  >190 mg/dL          Passed - HDL in normal range and within 360 days    HDL  Date Value Ref Range Status  07/02/2020 40.90 >39.00 mg/dL Final          Passed - Triglycerides in normal range and within 360 days    Triglycerides  Date Value Ref Range Status  07/02/2020 149.0 0.0 - 149.0 mg/dL Final    Comment:    Normal:  <150 mg/dLBorderline High:  150 - 199 mg/dL          Passed - Patient is not pregnant

## 2021-07-03 ENCOUNTER — Other Ambulatory Visit: Payer: Self-pay

## 2021-07-03 ENCOUNTER — Ambulatory Visit (INDEPENDENT_AMBULATORY_CARE_PROVIDER_SITE_OTHER): Payer: Medicare Other | Admitting: Internal Medicine

## 2021-07-03 ENCOUNTER — Encounter: Payer: Medicare Other | Admitting: Internal Medicine

## 2021-07-03 ENCOUNTER — Encounter: Payer: Self-pay | Admitting: Internal Medicine

## 2021-07-03 VITALS — BP 140/78 | HR 74 | Temp 97.1°F | Resp 17 | Ht 66.0 in | Wt 262.6 lb

## 2021-07-03 DIAGNOSIS — G8929 Other chronic pain: Secondary | ICD-10-CM | POA: Diagnosis not present

## 2021-07-03 DIAGNOSIS — I10 Essential (primary) hypertension: Secondary | ICD-10-CM | POA: Diagnosis not present

## 2021-07-03 DIAGNOSIS — G4733 Obstructive sleep apnea (adult) (pediatric): Secondary | ICD-10-CM

## 2021-07-03 DIAGNOSIS — E782 Mixed hyperlipidemia: Secondary | ICD-10-CM

## 2021-07-03 DIAGNOSIS — N183 Chronic kidney disease, stage 3 unspecified: Secondary | ICD-10-CM | POA: Insufficient documentation

## 2021-07-03 DIAGNOSIS — Z23 Encounter for immunization: Secondary | ICD-10-CM | POA: Diagnosis not present

## 2021-07-03 DIAGNOSIS — Z1159 Encounter for screening for other viral diseases: Secondary | ICD-10-CM

## 2021-07-03 DIAGNOSIS — Z853 Personal history of malignant neoplasm of breast: Secondary | ICD-10-CM | POA: Diagnosis not present

## 2021-07-03 DIAGNOSIS — M5441 Lumbago with sciatica, right side: Secondary | ICD-10-CM | POA: Diagnosis not present

## 2021-07-03 DIAGNOSIS — E6609 Other obesity due to excess calories: Secondary | ICD-10-CM | POA: Insufficient documentation

## 2021-07-03 DIAGNOSIS — R7303 Prediabetes: Secondary | ICD-10-CM

## 2021-07-03 DIAGNOSIS — N1831 Chronic kidney disease, stage 3a: Secondary | ICD-10-CM

## 2021-07-03 DIAGNOSIS — Z0000 Encounter for general adult medical examination without abnormal findings: Secondary | ICD-10-CM

## 2021-07-03 DIAGNOSIS — Z6838 Body mass index (BMI) 38.0-38.9, adult: Secondary | ICD-10-CM | POA: Insufficient documentation

## 2021-07-03 DIAGNOSIS — L308 Other specified dermatitis: Secondary | ICD-10-CM | POA: Diagnosis not present

## 2021-07-03 DIAGNOSIS — K219 Gastro-esophageal reflux disease without esophagitis: Secondary | ICD-10-CM

## 2021-07-03 MED ORDER — FLUTICASONE PROPIONATE 50 MCG/ACT NA SUSP: NASAL | 4 refills | Status: AC

## 2021-07-03 NOTE — Patient Instructions (Signed)

## 2021-07-03 NOTE — Progress Notes (Signed)
HPI:  Patient presents the clinic today for her subsequent annual Medicare wellness exam.  She is also due to follow-up chronic conditions.  History of Breast Cancer: In remission.  She gets yearly mammograms.  GERD: She is not sure what triggers this.  She denies breakthrough on Esomeprazole.  Upper GI from 10/2003 reviewed.  HLD: Her last LDL was 70, triglycerides 149, 06/2020.  She denies myalgias on Atorvastatin.  She consumes a low-fat diet.  HTN: Her BP today is 165/71.  She is taking Olmesartan, HCTZ and a Potassium supplement as prescribed.  ECG from 08/2014 reviewed.  OSA: She is not currently using a CPAP.  There is no sleep study on file.  Chronic Back Pain with Right-Sided Sciatica: Persistent.  Managed with Tylenol and Methocarbamol as needed.  She no longer follows with physiatry.  Eczema: She is using Betamethasone cream as needed.  She is not seeing dermatology.  Prediabetes: Her last A1c was 6.1, 06/2020.  She does not check her sugars.  She is not taking any oral diabetic medication at this time.  CKD 3: Her last creatinine was 0.98, GFR 54.84.  She is on oOmesartan for renal protection.  She does not follow with nephrology  Past Medical History:  Diagnosis Date   Bradycardia    Breast cancer ([place]) 08/29/14   Right Breast -High Grade Invasive Mammary   Cataract    surgery   GERD (gastroesophageal reflux disease)    History of shingles    Hx of radiation therapy 10/01/14- 11/20/14   right breast/50.4 Gy/28 fx; right breast boost/10 Gy/5 fx   Hyperlipidemia    Hypertension    IBS (irritable bowel syndrome)    Multifocal PVCs    PVC (premature ventricular contraction)    Hx of   Sleep apnea    no cpap per pt     Current Outpatient Medications  Medication Sig Dispense Refill   acetaminophen (TYLENOL) 500 MG tablet Take 500 mg by mouth every 6 (six) hours as needed.     aspirin 81 MG tablet Take 81 mg by mouth daily.     atorvastatin (LIPITOR) 10 MG tablet TAKE  1 TABLET DAILY 90 tablet 3   betamethasone dipropionate 0.05 % cream Apply topically as needed. 30 g 1   cycloSPORINE (RESTASIS) 0.05 % ophthalmic emulsion Place 1 drop into both eyes 2 (two) times daily.       esomeprazole (NEXIUM) 40 MG capsule TAKE 1 CAPSULE DAILY AT 12 NOON 90 capsule 0   fluticasone (FLONASE) 50 MCG/ACT nasal spray TAKE 2 SPRAYS INTO BOTH NOSTRILS DAILY 16 g 4   hydrochlorothiazide (HYDRODIURIL) 25 MG tablet TAKE 1 TABLET DAILY 90 tablet 0   methocarbamol (ROBAXIN) 500 MG tablet TAKE 1 TABLET DAILY AS NEEDED FOR MUSCLE SPASMS 30 tablet 11   Multiple Vitamins-Minerals (HAIR/SKIN/NAILS PO) Take 1 tablet by mouth daily.     olmesartan (BENICAR) 20 MG tablet TAKE 1 TABLET DAILY 90 tablet 0   Potassium 99 MG TABS Take 1 tablet by mouth daily.       Zinc 50 MG CAPS Take by mouth.     No current facility-administered medications for this visit.    No Known Allergies  Family History  Problem Relation Age of Onset   Hypertension Mother    Heart disease Mother        CAD   Breast cancer Mother 69   Hypertension Sister    Breast cancer Sister 50   Aneurysm Sister  Hypertension Brother    Prostate cancer Brother 4   Leukemia Maternal Uncle 40   Heart disease Maternal Grandmother    Leukemia Maternal Uncle 45   Breast cancer Cousin        dx under 48   Colon cancer Cousin        dx in her 37s   Colon cancer Cousin    Lymphoma Other 75       NHL   Colon polyps Daughter    Stroke Father    Esophageal cancer Neg Hx    Stomach cancer Neg Hx    Rectal cancer Neg Hx     Social History   Socioeconomic History   Marital status: Married    Spouse name: Homer   Number of children: 1   Years of education: 12   Highest education level: Not on file  Occupational History    Comment: retired  Tobacco Use   Smoking status: Never   Smokeless tobacco: Never  Vaping Use   Vaping Use: Never used  Substance and Sexual Activity   Alcohol use: No    Alcohol/week:  0.0 standard drinks   Drug use: No   Sexual activity: Never  Other Topics Concern   Not on file  Social History Narrative   Consumes one glass of caffeine daily   Social Determinants of Health   Financial Resource Strain: Not on file  Food Insecurity: Not on file  Transportation Needs: Not on file  Physical Activity: Not on file  Stress: Not on file  Social Connections: Not on file  Intimate Partner Violence: Not on file    Hospitiliaztions: None  Health Maintenance:    Flu: 06/2020  Tetanus: 04/2012  Pneumovax: 02/2010  Prevnar: 08/2015  Zostavax: 11/2007  Shingrix: 02/2018, 05/2018  Mammogram: 03/2021  Pap Smear: No longer screening  Bone Density: 03/2021  Colon Screening: 10/2018  Eye Doctor: Annually  Dental Exam: Biannually   Providers:   PCP: Webb Silversmith, NP   I have personally reviewed and have noted:  1. The patient's medical and social history 2. Their use of alcohol, tobacco or illicit drugs 3. Their current medications and supplements 4. The patient's functional ability including ADL's, fall risks, home safety risks and hearing or visual impairment. 5. Diet and physical activities 6. Evidence for depression or mood disorder  Subjective:   Review of Systems:   Constitutional: Denies fever, malaise, fatigue, headache or abrupt weight changes.  HEENT: Denies eye pain, eye redness, ear pain, ringing in the ears, wax buildup, runny nose, nasal congestion, bloody nose, or sore throat. Respiratory: Patient reports intermittent shortness of breath with exertion.  Denies difficulty breathing, cough or sputum production.   Cardiovascular: Denies chest pain, chest tightness, palpitations or swelling in the hands or feet.  Gastrointestinal: Denies abdominal pain, bloating, constipation, diarrhea or blood in the stool.  GU: Denies urgency, frequency, pain with urination, burning sensation, blood in urine, odor or discharge. Musculoskeletal: Patient reports chronic  back pain.  Denies decrease in range of motion, difficulty with gait, or joint swelling.  Skin: Denies redness, rashes, lesions or ulcercations.  Neurological: Denies dizziness, difficulty with memory, difficulty with speech or problems with balance and coordination.  Psych: Denies anxiety, depression, SI/HI.  No other specific complaints in a complete review of systems (except as listed in HPI above).  Objective:  PE:  BP 140/78 (BP Location: Left Arm, Patient Position: Sitting, Cuff Size: Large)   Pulse 74   Temp (!) 97.1  F (36.2 C) (Temporal)   Resp 17   Ht 5' 6"  (1.676 m)   Wt 262 lb 9.6 oz (119.1 kg)   SpO2 100%   BMI 42.38 kg/m   Wt Readings from Last 3 Encounters:  07/02/20 260 lb (117.9 kg)  03/28/19 265 lb (120.2 kg)  11/25/18 269 lb (122 kg)    General: Appears her stated age, obese, in NAD. Skin: Warm, dry and intact. No ulcerations noted. HEENT: Head: normal shape and size; Eyes: sclera white and EOMs intact;  Neck: Neck supple, trachea midline. No masses, lumps or thyromegaly present.  Cardiovascular: Normal rate and rhythm. S1,S2 noted.  No murmur, rubs or gallops noted. No JVD or BLE edema. No carotid bruits noted. Pulmonary/Chest: Normal effort and positive vesicular breath sounds. No respiratory distress. No wheezes, rales or ronchi noted.  Abdomen: Soft and nontender. Normal bowel sounds. No distention or masses noted. Liver, spleen and kidneys non palpable. Musculoskeletal: Strength 5/5 BUE/BLE. No difficulty with gait. Neurological: Alert and oriented. Cranial nerves II-XII grossly intact. Coordination normal.  Psychiatric: Mood and affect normal. Behavior is normal. Judgment and thought content normal.    BMET    Component Value Date/Time   NA 138 07/02/2020 1009   NA 138 08/22/2014 1237   K 3.7 07/02/2020 1009   K 3.8 08/22/2014 1237   CL 100 07/02/2020 1009   CO2 29 07/02/2020 1009   CO2 28 08/22/2014 1237   GLUCOSE 111 (H) 07/02/2020 1009    GLUCOSE 102 08/22/2014 1237   BUN 18 07/02/2020 1009   BUN 16.0 08/22/2014 1237   CREATININE 0.98 07/02/2020 1009   CREATININE 1.1 08/22/2014 1237   CALCIUM 9.6 07/02/2020 1009   CALCIUM 9.8 08/22/2014 1237   GFRNONAA 53.06 02/17/2010 1454   GFRAA 72 12/01/2007 1105    Lipid Panel     Component Value Date/Time   CHOL 141 07/02/2020 1009   TRIG 149.0 07/02/2020 1009   HDL 40.90 07/02/2020 1009   CHOLHDL 3 07/02/2020 1009   VLDL 29.8 07/02/2020 1009   LDLCALC 70 07/02/2020 1009    CBC    Component Value Date/Time   WBC 8.4 07/02/2020 1009   RBC 4.65 07/02/2020 1009   HGB 13.7 07/02/2020 1009   HGB 13.9 08/22/2014 1237   HCT 40.9 07/02/2020 1009   HCT 42.7 08/22/2014 1237   PLT 366.0 07/02/2020 1009   PLT 345 08/22/2014 1237   MCV 88.0 07/02/2020 1009   MCV 88.7 08/22/2014 1237   MCH 29.0 08/22/2014 1237   MCHC 33.5 07/02/2020 1009   RDW 14.1 07/02/2020 1009   RDW 13.8 08/22/2014 1237   LYMPHSABS 2.9 08/22/2014 1237   MONOABS 0.8 08/22/2014 1237   EOSABS 0.4 08/22/2014 1237   BASOSABS 0.1 08/22/2014 1237    Hgb A1C Lab Results  Component Value Date   HGBA1C 6.1 07/02/2020      Assessment and Plan:   Medicare Annual Wellness Visit:  Diet: She does eat meat.  She consumes fruits and veggies daily.  She tries to avoid fried foods.  She drinks mostly water and sweet tea. Physical activity: Sedentary Depression/mood screen: Negative, PHQ 9 score 0 Hearing: Intact to whispered voice Visual acuity: Grossly normal, performs annual eye exam  ADLs: Capable Fall risk: None Home safety: Good Cognitive evaluation: Intact to orientation, naming, recall and repetition EOL planning: Adv directives, full code/ I agree  Preventative Medicine: Flu shot today.  Tetanus UTD.  Pneumovax and Prevnar UTD.  Zostavax and Shingrix  UTD.  Encouraged her to get her COVID booster.  Mammogram and bone density UTD.  She no longer wants to screen for cervical cancer.  Colon screening  UTD.  Encouraged her to consume a balanced diet and exercise regimen.  Advised her to see an eye doctor and dentist annually.  Will check CBC, c-Met, lipid, A1c and hep C today.  Due dates for screening exam given to patient as part of her AVS.   Next appointment: 1 year, Medicare wellness exam   Webb Silversmith, NP This visit occurred during the SARS-CoV-2 public health emergency.  Safety protocols were in place, including screening questions prior to the visit, additional usage of staff PPE, and extensive cleaning of exam room while observing appropriate contact time as indicated for disinfecting solutions.

## 2021-07-03 NOTE — Assessment & Plan Note (Signed)
C-Met and lipid profile today Encouraged her to consume a low-fat diet Continue Atorvastatin

## 2021-07-03 NOTE — Assessment & Plan Note (Signed)
Continue Olmesartan for renal protection C-Met today

## 2021-07-03 NOTE — Assessment & Plan Note (Signed)
Not wearing CPAP Encourage weight loss as this can help reduce sleep apnea symptoms

## 2021-07-03 NOTE — Assessment & Plan Note (Signed)
A1c today Encourage low-carb diet and exercise for weight loss 

## 2021-07-03 NOTE — Assessment & Plan Note (Signed)
In remission Continue yearly mammograms 

## 2021-07-03 NOTE — Assessment & Plan Note (Signed)
Encourage diet and exercise for weight loss 

## 2021-07-03 NOTE — Assessment & Plan Note (Signed)
Slightly elevated but manual repeat improved Continue Olmesartan, HCTZ and Potassium Reinforced DASH diet and exercise for weight loss C-Met today

## 2021-07-03 NOTE — Assessment & Plan Note (Signed)
Try to identify foods that trigger reflux and avoid them Encourage weight loss as this can help reduce reflux symptoms Continue Esomeprazole

## 2021-07-03 NOTE — Assessment & Plan Note (Signed)
Encourage regular stretching Continue Tylenol and Methocarbamol as needed

## 2021-07-03 NOTE — Assessment & Plan Note (Signed)
Continue Betamethasone as needed

## 2021-07-04 LAB — HEMOGLOBIN A1C
Hgb A1c MFr Bld: 5.6 % of total Hgb (ref <5.7)
Mean Plasma Glucose: 114 mg/dL
eAG (mmol/L): 6.3 mmol/L

## 2021-07-04 LAB — CBC
HCT: 42.9 % (ref 35.0–45.0)
Hemoglobin: 14.2 g/dL (ref 11.7–15.5)
MCH: 28.5 pg (ref 27.0–33.0)
MCHC: 33.1 g/dL (ref 32.0–36.0)
MCV: 86.1 fL (ref 80.0–100.0)
MPV: 11.1 fL (ref 7.5–12.5)
Platelets: 404 10*3/uL — ABNORMAL HIGH (ref 140–400)
RBC: 4.98 10*6/uL (ref 3.80–5.10)
RDW: 12.8 % (ref 11.0–15.0)
WBC: 6.6 10*3/uL (ref 3.8–10.8)

## 2021-07-04 LAB — COMPLETE METABOLIC PANEL WITH GFR
AG Ratio: 1.4 (calc) (ref 1.0–2.5)
ALT: 24 U/L (ref 6–29)
AST: 19 U/L (ref 10–35)
Albumin: 4.4 g/dL (ref 3.6–5.1)
Alkaline phosphatase (APISO): 48 U/L (ref 37–153)
BUN: 14 mg/dL (ref 7–25)
CO2: 26 mmol/L (ref 20–32)
Calcium: 10 mg/dL (ref 8.6–10.4)
Chloride: 100 mmol/L (ref 98–110)
Creat: 0.93 mg/dL (ref 0.60–1.00)
Globulin: 3.2 g/dL (calc) (ref 1.9–3.7)
Glucose, Bld: 103 mg/dL — ABNORMAL HIGH (ref 65–99)
Potassium: 4.2 mmol/L (ref 3.5–5.3)
Sodium: 137 mmol/L (ref 135–146)
Total Bilirubin: 0.6 mg/dL (ref 0.2–1.2)
Total Protein: 7.6 g/dL (ref 6.1–8.1)
eGFR: 63 mL/min/{1.73_m2} (ref >=60)

## 2021-07-04 LAB — LIPID PANEL
Cholesterol: 146 mg/dL (ref <200)
HDL: 45 mg/dL — ABNORMAL LOW (ref >=50)
LDL Cholesterol (Calc): 76 mg/dL (calc)
Non-HDL Cholesterol (Calc): 101 mg/dL (calc) (ref <130)
Total CHOL/HDL Ratio: 3.2 (calc) (ref <5.0)
Triglycerides: 146 mg/dL (ref <150)

## 2021-07-04 LAB — HEPATITIS C ANTIBODY
Hepatitis C Ab: NONREACTIVE
SIGNAL TO CUT-OFF: 0.02 (ref <1.00)

## 2021-07-07 ENCOUNTER — Other Ambulatory Visit: Payer: Self-pay | Admitting: Internal Medicine

## 2021-07-07 DIAGNOSIS — I10 Essential (primary) hypertension: Secondary | ICD-10-CM

## 2021-07-07 NOTE — Telephone Encounter (Signed)
Future visit in 1 year

## 2021-07-14 ENCOUNTER — Other Ambulatory Visit: Payer: Self-pay | Admitting: Internal Medicine

## 2021-07-14 DIAGNOSIS — I10 Essential (primary) hypertension: Secondary | ICD-10-CM

## 2021-07-22 ENCOUNTER — Telehealth: Payer: Self-pay

## 2021-07-22 NOTE — Telephone Encounter (Signed)
Copied from [place] [id number]. Topic: General - Other >> Jul 22, 2021 11:11 AM Antonieta Iba C wrote: Reason for CRM: pt is requesting to have a copy of her most recent lab results mailed out to her. Verified mailing address on file.    Please assist pt further.

## 2021-07-22 NOTE — Telephone Encounter (Signed)
Labs mailed out on 07/22/2021.

## 2021-08-30 ENCOUNTER — Other Ambulatory Visit: Payer: Self-pay | Admitting: Internal Medicine

## 2021-08-30 NOTE — Telephone Encounter (Signed)
Requested medication (s) are due for refill today: yes  Requested medication (s) are on the active medication list: yes  Last refill:  07/02/20 30 g  1 RF  Future visit scheduled: yes  Notes to clinic:  med not assigned to a protocol   Requested Prescriptions  Pending Prescriptions Disp Refills   betamethasone dipropionate 0.05 % cream [Pharmacy Med Name: BETAMETHASONE DIP CRM 0.05%] 30 g 11    Sig: Apply topically as needed.     Off-Protocol Failed - 08/30/2021 12:22 PM      Failed - Medication not assigned to a protocol, review manually.      Passed - Valid encounter within last 12 months    Recent Outpatient Visits           1 month ago Medicare annual wellness visit, subsequent   [facility] [place], Coralie Keens, NP       Future Appointments             In 10 months Ba[place], Coralie Keens, NP [facility], [facility]

## 2021-09-02 ENCOUNTER — Telehealth: Payer: Self-pay

## 2021-09-02 NOTE — Telephone Encounter (Signed)
Copied from [place] [id number]. Topic: General - Call Back - No Documentation >> Sep 01, 2021  1:57 PM Erick Blinks wrote: Reason for CRM: [facility] called reporting that they are missing information and need to speak with clinic  Best contact: [phone] Reference: [id number]    I spoke with someone at the [facility] order pharmacy and clarified that diagnoses code.

## 2021-10-21 ENCOUNTER — Telehealth: Payer: Self-pay | Admitting: Internal Medicine

## 2021-10-21 NOTE — Telephone Encounter (Signed)
I called the patient and she informed me that she been having intermittent chronic leg and back pain. She said she think its arthritis. She also stated that she discussed this with you in the past about possibly needing a handicap placard in the future.

## 2021-10-21 NOTE — Telephone Encounter (Signed)
Patient would a handicap sticker and unsure how to go about it, please advise

## 2021-10-21 NOTE — Telephone Encounter (Signed)
What would she be requesting this for?  She does not have a cardiac, pulmonary or orthopedic condition that would apply

## 2021-10-22 NOTE — Telephone Encounter (Signed)
The only way we could justify at this time is if she was using some sort of assistive device such as a cane or a walker.  Is she using anything like this?

## 2021-10-23 ENCOUNTER — Other Ambulatory Visit: Payer: Self-pay

## 2021-10-23 NOTE — Telephone Encounter (Signed)
The pt was notified that the handicap placard was filled out an is available for pick up. She requested that we mail it out to her.

## 2021-10-23 NOTE — Telephone Encounter (Signed)
The pt reports that she does use a cane as an assistive device to get around.

## 2021-10-23 NOTE — Telephone Encounter (Signed)
Form completed, placed in my outbox. Notify pt she can pickup at front desk

## 2021-11-25 ENCOUNTER — Encounter (HOSPITAL_BASED_OUTPATIENT_CLINIC_OR_DEPARTMENT_OTHER): Payer: Self-pay | Admitting: Emergency Medicine

## 2021-11-25 ENCOUNTER — Inpatient Hospital Stay (HOSPITAL_BASED_OUTPATIENT_CLINIC_OR_DEPARTMENT_OTHER)
Admission: EM | Admit: 2021-11-25 | Discharge: 2021-11-29 | DRG: 418 | Disposition: A | Discharge status: Home / Self Care | Payer: Medicare Other | Attending: Internal Medicine | Admitting: Internal Medicine

## 2021-11-25 ENCOUNTER — Other Ambulatory Visit: Payer: Self-pay

## 2021-11-25 DIAGNOSIS — Z6841 Body Mass Index (BMI) 40.0 and over, adult: Secondary | ICD-10-CM | POA: Diagnosis not present

## 2021-11-25 DIAGNOSIS — Z79899 Other long term (current) drug therapy: Secondary | ICD-10-CM | POA: Diagnosis not present

## 2021-11-25 DIAGNOSIS — Z807 Family history of other malignant neoplasms of lymphoid, hematopoietic and related tissues: Secondary | ICD-10-CM

## 2021-11-25 DIAGNOSIS — N289 Disorder of kidney and ureter, unspecified: Secondary | ICD-10-CM | POA: Diagnosis not present

## 2021-11-25 DIAGNOSIS — D72828 Other elevated white blood cell count: Secondary | ICD-10-CM | POA: Diagnosis present

## 2021-11-25 DIAGNOSIS — R109 Unspecified abdominal pain: Secondary | ICD-10-CM

## 2021-11-25 DIAGNOSIS — K589 Irritable bowel syndrome without diarrhea: Secondary | ICD-10-CM | POA: Diagnosis present

## 2021-11-25 DIAGNOSIS — K859 Acute pancreatitis without necrosis or infection, unspecified: Secondary | ICD-10-CM | POA: Diagnosis not present

## 2021-11-25 DIAGNOSIS — Z66 Do not resuscitate: Secondary | ICD-10-CM | POA: Diagnosis present

## 2021-11-25 DIAGNOSIS — E785 Hyperlipidemia, unspecified: Secondary | ICD-10-CM | POA: Diagnosis present

## 2021-11-25 DIAGNOSIS — R1110 Vomiting, unspecified: Secondary | ICD-10-CM | POA: Diagnosis not present

## 2021-11-25 DIAGNOSIS — Z923 Personal history of irradiation: Secondary | ICD-10-CM

## 2021-11-25 DIAGNOSIS — K7581 Nonalcoholic steatohepatitis (NASH): Secondary | ICD-10-CM | POA: Diagnosis present

## 2021-11-25 DIAGNOSIS — Z20822 Contact with and (suspected) exposure to covid-19: Secondary | ICD-10-CM | POA: Diagnosis present

## 2021-11-25 DIAGNOSIS — K8020 Calculus of gallbladder without cholecystitis without obstruction: Secondary | ICD-10-CM

## 2021-11-25 DIAGNOSIS — Z8249 Family history of ischemic heart disease and other diseases of the circulatory system: Secondary | ICD-10-CM

## 2021-11-25 DIAGNOSIS — K8066 Calculus of gallbladder and bile duct with acute and chronic cholecystitis without obstruction: Secondary | ICD-10-CM | POA: Diagnosis not present

## 2021-11-25 DIAGNOSIS — Z806 Family history of leukemia: Secondary | ICD-10-CM | POA: Diagnosis not present

## 2021-11-25 DIAGNOSIS — Z823 Family history of stroke: Secondary | ICD-10-CM | POA: Diagnosis not present

## 2021-11-25 DIAGNOSIS — E876 Hypokalemia: Secondary | ICD-10-CM

## 2021-11-25 DIAGNOSIS — K219 Gastro-esophageal reflux disease without esophagitis: Secondary | ICD-10-CM | POA: Diagnosis present

## 2021-11-25 DIAGNOSIS — K7401 Hepatic fibrosis, early fibrosis: Secondary | ICD-10-CM | POA: Diagnosis not present

## 2021-11-25 DIAGNOSIS — N183 Chronic kidney disease, stage 3 unspecified: Secondary | ICD-10-CM | POA: Diagnosis not present

## 2021-11-25 DIAGNOSIS — R160 Hepatomegaly, not elsewhere classified: Secondary | ICD-10-CM | POA: Diagnosis not present

## 2021-11-25 DIAGNOSIS — Z800 Family history of malignant neoplasm of digestive organs: Secondary | ICD-10-CM | POA: Diagnosis not present

## 2021-11-25 DIAGNOSIS — K828 Other specified diseases of gallbladder: Secondary | ICD-10-CM | POA: Diagnosis present

## 2021-11-25 DIAGNOSIS — Z853 Personal history of malignant neoplasm of breast: Secondary | ICD-10-CM | POA: Diagnosis not present

## 2021-11-25 DIAGNOSIS — K760 Fatty (change of) liver, not elsewhere classified: Secondary | ICD-10-CM | POA: Diagnosis not present

## 2021-11-25 DIAGNOSIS — I10 Essential (primary) hypertension: Secondary | ICD-10-CM | POA: Diagnosis not present

## 2021-11-25 DIAGNOSIS — R1011 Right upper quadrant pain: Secondary | ICD-10-CM | POA: Diagnosis not present

## 2021-11-25 DIAGNOSIS — Z419 Encounter for procedure for purposes other than remedying health state, unspecified: Secondary | ICD-10-CM

## 2021-11-25 DIAGNOSIS — Z803 Family history of malignant neoplasm of breast: Secondary | ICD-10-CM

## 2021-11-25 DIAGNOSIS — Z7982 Long term (current) use of aspirin: Secondary | ICD-10-CM

## 2021-11-25 DIAGNOSIS — K851 Biliary acute pancreatitis without necrosis or infection: Principal | ICD-10-CM | POA: Diagnosis present

## 2021-11-25 DIAGNOSIS — K8012 Calculus of gallbladder with acute and chronic cholecystitis without obstruction: Secondary | ICD-10-CM | POA: Diagnosis not present

## 2021-11-25 LAB — COMPREHENSIVE METABOLIC PANEL
ALT: 122 U/L — ABNORMAL HIGH (ref 0–44)
AST: 143 U/L — ABNORMAL HIGH (ref 15–41)
Albumin: 4 g/dL (ref 3.5–5.0)
Alkaline Phosphatase: 64 U/L (ref 38–126)
Anion gap: 15 (ref 5–15)
BUN: 17 mg/dL (ref 8–23)
CO2: 21 mmol/L — ABNORMAL LOW (ref 22–32)
Calcium: 9.3 mg/dL (ref 8.9–10.3)
Chloride: 99 mmol/L (ref 98–111)
Creatinine, Ser: 0.85 mg/dL (ref 0.44–1.00)
GFR, Estimated: >60 mL/min (ref >60)
Glucose, Bld: 138 mg/dL — ABNORMAL HIGH (ref 70–99)
Potassium: 2.9 mmol/L — ABNORMAL LOW (ref 3.5–5.1)
Sodium: 135 mmol/L (ref 135–145)
Total Bilirubin: 1.3 mg/dL — ABNORMAL HIGH (ref 0.3–1.2)
Total Protein: 7.4 g/dL (ref 6.5–8.1)

## 2021-11-25 LAB — CBC
HCT: 40.3 % (ref 36.0–46.0)
Hemoglobin: 13.4 g/dL (ref 12.0–15.0)
MCH: 29.2 pg (ref 26.0–34.0)
MCHC: 33.3 g/dL (ref 30.0–36.0)
MCV: 87.8 fL (ref 80.0–100.0)
Platelets: 421 10*3/uL — ABNORMAL HIGH (ref 150–400)
RBC: 4.59 MIL/uL (ref 3.87–5.11)
RDW: 13.2 % (ref 11.5–15.5)
WBC: 17.7 10*3/uL — ABNORMAL HIGH (ref 4.0–10.5)
nRBC: 0 % (ref 0.0–0.2)

## 2021-11-25 LAB — TROPONIN I (HIGH SENSITIVITY): Troponin I (High Sensitivity): 2 ng/L (ref <18)

## 2021-11-25 LAB — LIPASE, BLOOD: Lipase: 1238 U/L — ABNORMAL HIGH (ref 11–51)

## 2021-11-25 MED ORDER — SODIUM CHLORIDE 0.9 % IV BOLUS (SEPSIS)
1000.0000 mL | Freq: Once | INTRAVENOUS | Status: AC
  Administered 2021-11-26: 1000 mL via INTRAVENOUS

## 2021-11-25 MED ORDER — SODIUM CHLORIDE 0.9 % IV SOLN
1000.0000 mL | INTRAVENOUS | Status: DC
  Administered 2021-11-26 – 2021-11-28 (×5): 1000 mL via INTRAVENOUS

## 2021-11-25 MED ORDER — HYDROMORPHONE HCL 1 MG/ML IJ SOLN
0.5000 mg | Freq: Once | INTRAMUSCULAR | Status: AC
Start: 2021-11-26 — End: 2021-11-26
  Administered 2021-11-26: 0.5 mg via INTRAVENOUS
  Filled 2021-11-25: qty 1

## 2021-11-25 NOTE — ED Triage Notes (Signed)
Pt c/o acute onset of right upper abdominal pain starting today at 4 pm, constant since. 1 Episode of vomiting PTA. Abdomen tender on palpation, excessive gas.  Diverticulitis on last colonoscopy and HX IBS. Has strawberries earlier today that typical causes IBS flare up.   While in triage pt began to experience epigastric pain and SOB, EKG obtained.

## 2021-11-25 NOTE — ED Notes (Signed)
Shon Millet RN informed request for room. No room available at this time.

## 2021-11-26 ENCOUNTER — Emergency Department (HOSPITAL_BASED_OUTPATIENT_CLINIC_OR_DEPARTMENT_OTHER): Payer: Medicare Other

## 2021-11-26 DIAGNOSIS — Z20822 Contact with and (suspected) exposure to covid-19: Secondary | ICD-10-CM | POA: Diagnosis present

## 2021-11-26 DIAGNOSIS — K7401 Hepatic fibrosis, early fibrosis: Secondary | ICD-10-CM | POA: Diagnosis not present

## 2021-11-26 DIAGNOSIS — R160 Hepatomegaly, not elsewhere classified: Secondary | ICD-10-CM | POA: Diagnosis not present

## 2021-11-26 DIAGNOSIS — K8066 Calculus of gallbladder and bile duct with acute and chronic cholecystitis without obstruction: Secondary | ICD-10-CM | POA: Diagnosis not present

## 2021-11-26 DIAGNOSIS — K828 Other specified diseases of gallbladder: Secondary | ICD-10-CM | POA: Diagnosis present

## 2021-11-26 DIAGNOSIS — Z8249 Family history of ischemic heart disease and other diseases of the circulatory system: Secondary | ICD-10-CM | POA: Diagnosis not present

## 2021-11-26 DIAGNOSIS — K7581 Nonalcoholic steatohepatitis (NASH): Secondary | ICD-10-CM | POA: Diagnosis present

## 2021-11-26 DIAGNOSIS — K8020 Calculus of gallbladder without cholecystitis without obstruction: Secondary | ICD-10-CM | POA: Diagnosis not present

## 2021-11-26 DIAGNOSIS — Z823 Family history of stroke: Secondary | ICD-10-CM | POA: Diagnosis not present

## 2021-11-26 DIAGNOSIS — Z807 Family history of other malignant neoplasms of lymphoid, hematopoietic and related tissues: Secondary | ICD-10-CM | POA: Diagnosis not present

## 2021-11-26 DIAGNOSIS — K760 Fatty (change of) liver, not elsewhere classified: Secondary | ICD-10-CM | POA: Diagnosis not present

## 2021-11-26 DIAGNOSIS — D72828 Other elevated white blood cell count: Secondary | ICD-10-CM | POA: Diagnosis present

## 2021-11-26 DIAGNOSIS — R1011 Right upper quadrant pain: Secondary | ICD-10-CM | POA: Diagnosis not present

## 2021-11-26 DIAGNOSIS — K589 Irritable bowel syndrome without diarrhea: Secondary | ICD-10-CM | POA: Diagnosis present

## 2021-11-26 DIAGNOSIS — I10 Essential (primary) hypertension: Secondary | ICD-10-CM

## 2021-11-26 DIAGNOSIS — R1110 Vomiting, unspecified: Secondary | ICD-10-CM | POA: Diagnosis not present

## 2021-11-26 DIAGNOSIS — K851 Biliary acute pancreatitis without necrosis or infection: Secondary | ICD-10-CM | POA: Diagnosis not present

## 2021-11-26 DIAGNOSIS — Z803 Family history of malignant neoplasm of breast: Secondary | ICD-10-CM | POA: Diagnosis not present

## 2021-11-26 DIAGNOSIS — E785 Hyperlipidemia, unspecified: Secondary | ICD-10-CM | POA: Diagnosis present

## 2021-11-26 DIAGNOSIS — K219 Gastro-esophageal reflux disease without esophagitis: Secondary | ICD-10-CM | POA: Diagnosis present

## 2021-11-26 DIAGNOSIS — K859 Acute pancreatitis without necrosis or infection, unspecified: Secondary | ICD-10-CM | POA: Diagnosis present

## 2021-11-26 DIAGNOSIS — Z806 Family history of leukemia: Secondary | ICD-10-CM | POA: Diagnosis not present

## 2021-11-26 DIAGNOSIS — Z853 Personal history of malignant neoplasm of breast: Secondary | ICD-10-CM | POA: Diagnosis not present

## 2021-11-26 DIAGNOSIS — N289 Disorder of kidney and ureter, unspecified: Secondary | ICD-10-CM | POA: Diagnosis not present

## 2021-11-26 DIAGNOSIS — Z7982 Long term (current) use of aspirin: Secondary | ICD-10-CM | POA: Diagnosis not present

## 2021-11-26 DIAGNOSIS — Z66 Do not resuscitate: Secondary | ICD-10-CM | POA: Diagnosis present

## 2021-11-26 DIAGNOSIS — E876 Hypokalemia: Secondary | ICD-10-CM | POA: Diagnosis not present

## 2021-11-26 DIAGNOSIS — Z79899 Other long term (current) drug therapy: Secondary | ICD-10-CM | POA: Diagnosis not present

## 2021-11-26 DIAGNOSIS — Z800 Family history of malignant neoplasm of digestive organs: Secondary | ICD-10-CM | POA: Diagnosis not present

## 2021-11-26 DIAGNOSIS — N183 Chronic kidney disease, stage 3 unspecified: Secondary | ICD-10-CM | POA: Diagnosis not present

## 2021-11-26 DIAGNOSIS — Z923 Personal history of irradiation: Secondary | ICD-10-CM | POA: Diagnosis not present

## 2021-11-26 DIAGNOSIS — Z6841 Body Mass Index (BMI) 40.0 and over, adult: Secondary | ICD-10-CM | POA: Diagnosis not present

## 2021-11-26 DIAGNOSIS — K8012 Calculus of gallbladder with acute and chronic cholecystitis without obstruction: Secondary | ICD-10-CM | POA: Diagnosis present

## 2021-11-26 LAB — URINALYSIS, ROUTINE W REFLEX MICROSCOPIC
Bilirubin Urine: NEGATIVE
Glucose, UA: NEGATIVE mg/dL
Hgb urine dipstick: NEGATIVE
Ketones, ur: NEGATIVE mg/dL
Leukocytes,Ua: NEGATIVE
Nitrite: NEGATIVE
Specific Gravity, Urine: 1.03 (ref 1.005–1.030)
pH: 7 (ref 5.0–8.0)

## 2021-11-26 LAB — COMPREHENSIVE METABOLIC PANEL
ALT: 134 U/L — ABNORMAL HIGH (ref 0–44)
AST: 91 U/L — ABNORMAL HIGH (ref 15–41)
Albumin: 3.3 g/dL — ABNORMAL LOW (ref 3.5–5.0)
Alkaline Phosphatase: 69 U/L (ref 38–126)
Anion gap: 8 (ref 5–15)
BUN: 16 mg/dL (ref 8–23)
CO2: 25 mmol/L (ref 22–32)
Calcium: 8.3 mg/dL — ABNORMAL LOW (ref 8.9–10.3)
Chloride: 103 mmol/L (ref 98–111)
Creatinine, Ser: 0.77 mg/dL (ref 0.44–1.00)
GFR, Estimated: >60 mL/min (ref >60)
Glucose, Bld: 100 mg/dL — ABNORMAL HIGH (ref 70–99)
Potassium: 3.2 mmol/L — ABNORMAL LOW (ref 3.5–5.1)
Sodium: 136 mmol/L (ref 135–145)
Total Bilirubin: 3.7 mg/dL — ABNORMAL HIGH (ref 0.3–1.2)
Total Protein: 7.1 g/dL (ref 6.5–8.1)

## 2021-11-26 LAB — LIPASE, BLOOD: Lipase: 1069 U/L — ABNORMAL HIGH (ref 11–51)

## 2021-11-26 LAB — RESP PANEL BY RT-PCR (FLU A&B, COVID) ARPGX2
Influenza A by PCR: NEGATIVE
Influenza B by PCR: NEGATIVE
SARS Coronavirus 2 by RT PCR: NEGATIVE

## 2021-11-26 LAB — MAGNESIUM: Magnesium: 1.8 mg/dL (ref 1.7–2.4)

## 2021-11-26 LAB — TRIGLYCERIDES: Triglycerides: 85 mg/dL (ref <150)

## 2021-11-26 LAB — TROPONIN I (HIGH SENSITIVITY): Troponin I (High Sensitivity): 4 ng/L (ref <18)

## 2021-11-26 MED ORDER — DIPHENHYDRAMINE HCL 25 MG PO CAPS
25.0000 mg | ORAL_CAPSULE | Freq: Once | ORAL | Status: AC
  Administered 2021-11-26: 25 mg via ORAL
  Filled 2021-11-26: qty 1

## 2021-11-26 MED ORDER — FLUTICASONE PROPIONATE 50 MCG/ACT NA SUSP
1.0000 | Freq: Every day | NASAL | Status: DC | PRN
  Filled 2021-11-26: qty 16

## 2021-11-26 MED ORDER — LORATADINE 10 MG PO TABS
10.0000 mg | ORAL_TABLET | Freq: Every day | ORAL | Status: DC
  Administered 2021-11-26 – 2021-11-29 (×4): 10 mg via ORAL
  Filled 2021-11-26 (×4): qty 1

## 2021-11-26 MED ORDER — CYCLOSPORINE 0.05 % OP EMUL
1.0000 [drp] | Freq: Two times a day (BID) | OPHTHALMIC | Status: DC
  Administered 2021-11-26 – 2021-11-29 (×6): 1 [drp] via OPHTHALMIC
  Filled 2021-11-26 (×6): qty 30

## 2021-11-26 MED ORDER — POTASSIUM CHLORIDE 10 MEQ/100ML IV SOLN
10.0000 meq | INTRAVENOUS | Status: AC
  Administered 2021-11-26 (×2): 10 meq via INTRAVENOUS
  Filled 2021-11-26 (×2): qty 100

## 2021-11-26 MED ORDER — METHOCARBAMOL 500 MG PO TABS: 500.0000 mg | ORAL_TABLET | Freq: Three times a day (TID) | ORAL | Status: DC | PRN

## 2021-11-26 MED ORDER — HYDROMORPHONE HCL 1 MG/ML IJ SOLN
0.5000 mg | INTRAMUSCULAR | Status: DC | PRN
  Administered 2021-11-26 (×2): 0.5 mg via INTRAVENOUS
  Filled 2021-11-26 (×2): qty 1

## 2021-11-26 MED ORDER — PANTOPRAZOLE SODIUM 40 MG PO TBEC
40.0000 mg | DELAYED_RELEASE_TABLET | Freq: Every day | ORAL | Status: DC
  Administered 2021-11-26 – 2021-11-29 (×4): 40 mg via ORAL
  Filled 2021-11-26 (×4): qty 1

## 2021-11-26 MED ORDER — ZINC 50 MG PO CAPS: 50.0000 mg | ORAL_CAPSULE | Freq: Every day | ORAL | Status: DC

## 2021-11-26 MED ORDER — IOHEXOL 300 MG/ML  SOLN
100.0000 mL | Freq: Once | INTRAMUSCULAR | Status: AC | PRN
  Administered 2021-11-26: 100 mL via INTRAVENOUS

## 2021-11-26 MED ORDER — ZINC SULFATE 220 (50 ZN) MG PO CAPS
220.0000 mg | ORAL_CAPSULE | Freq: Every day | ORAL | Status: DC
  Administered 2021-11-27 – 2021-11-29 (×3): 220 mg via ORAL
  Filled 2021-11-26 (×3): qty 1

## 2021-11-26 MED ORDER — HYDROMORPHONE HCL 1 MG/ML IJ SOLN
0.5000 mg | INTRAMUSCULAR | Status: DC | PRN
  Administered 2021-11-27: 0.5 mg via INTRAVENOUS
  Filled 2021-11-26: qty 0.5

## 2021-11-26 MED ORDER — LABETALOL HCL 5 MG/ML IV SOLN
5.0000 mg | INTRAVENOUS | Status: DC | PRN
  Administered 2021-11-26: 5 mg via INTRAVENOUS
  Filled 2021-11-26: qty 4

## 2021-11-26 MED ORDER — ONDANSETRON HCL 4 MG/2ML IJ SOLN: 4.0000 mg | Freq: Four times a day (QID) | INTRAMUSCULAR | Status: DC | PRN

## 2021-11-26 NOTE — ED Provider Notes (Signed)
[facility] EMERGENCY DEPT Provider Note  CSN: [id number] Arrival date & time: 11/25/21 2136  Chief Complaint(s) Abdominal Pain  HPI Miranda Mueller is a 81 y.o. female with a past medical history listed below who presents to the emergency department with sudden onset right upper quadrant and epigastric abdominal pain that began earlier this afternoon and gradually worsening.  Associated with nausea and nonbloody nonbilious emesis.  Pain began after having late lunch/early dinner.  No fevers or chills. No diarrhea. No chest pain. She is endorsing shortness of breath related to the pain  Denies any alcohol use.  The history is provided by the patient.   Past Medical History Past Medical History:  Diagnosis Date   Bradycardia    Breast cancer ([place]) 08/29/14   Right Breast -High Grade Invasive Mammary   Cataract    surgery   GERD (gastroesophageal reflux disease)    History of shingles    Hx of radiation therapy 10/01/14- 11/20/14   right breast/50.4 Gy/28 fx; right breast boost/10 Gy/5 fx   Hyperlipidemia    Hypertension    IBS (irritable bowel syndrome)    Multifocal PVCs    PVC (premature ventricular contraction)    Hx of   Sleep apnea    no cpap per pt    Patient Active Problem List   Diagnosis Date Noted   Acute pancreatitis 11/26/2021   CKD (chronic kidney disease) stage 3, GFR 30-59 ml/min ([place]) 07/03/2021   Morbid obesity ([place]) 07/03/2021   Prediabetes 07/02/2020   Chronic midline low back pain with right-sided sciatica 07/02/2020   Eczema 07/02/2020   OSA (obstructive sleep apnea) 08/27/2015   History of breast cancer 08/20/2014   HLD (hyperlipidemia) 11/23/2007   Essential hypertension 11/23/2007   GERD 11/23/2007   Home Medication(s) Prior to Admission medications   Medication Sig Start Date End Date Taking? Authorizing Provider  acetaminophen (TYLENOL) 500 MG tablet Take 500 mg by mouth every 6 (six) hours as needed.    [provider]  aspirin 81 MG tablet Take 81 mg by mouth daily.    [provider]  atorvastatin (LIPITOR) 10 MG tablet TAKE 1 TABLET DAILY 04/24/21   Jearld Fenton, NP  betamethasone dipropionate 0.05 % cream APPLY TOPICALLY AS NEEDED 09/01/21   Jearld Fenton, NP  cycloSPORINE (RESTASIS) 0.05 % ophthalmic emulsion Place 1 drop into both eyes 2 (two) times daily.      [provider]  esomeprazole (NEXIUM) 40 MG capsule TAKE 1 CAPSULE DAILY AT 12 NOON 07/14/21   Baity, Coralie Keens, NP  fluticasone [facility]) 50 MCG/ACT nasal spray TAKE 2 SPRAYS INTO BOTH NOSTRILS DAILY 07/03/21   Jearld Fenton, NP  hydrochlorothiazide (HYDRODIURIL) 25 MG tablet TAKE 1 TABLET DAILY 07/07/21   Jearld Fenton, NP  methocarbamol (ROBAXIN) 500 MG tablet TAKE 1 TABLET DAILY AS NEEDED FOR MUSCLE SPASMS 12/05/20   Jearld Fenton, NP  Multiple Vitamins-Minerals (HAIR/SKIN/NAILS PO) Take 1 tablet by mouth daily.    [provider]  olmesartan (BENICAR) 20 MG tablet TAKE 1 TABLET DAILY 07/14/21   Jearld Fenton, NP  Potassium 99 MG TABS Take 1 tablet by mouth daily.      [provider]  Zinc 50 MG CAPS Take by mouth.    [provider]  Allergies Patient has no known allergies.  Review of Systems Review of Systems As noted in HPI  Physical Exam Vital Signs  I have reviewed the triage vital signs BP (!) 148/68    Pulse 98    Temp 98.6 F (37 C)    Resp (!) 23    Ht 5\' 6"  (1.676 m)    Wt 118.8 kg    SpO2 95%    BMI 42.29 kg/m   Physical Exam Vitals reviewed.  Constitutional:      General: She is not in acute distress.    Appearance: She is well-developed. She is not diaphoretic.  HENT:     Head: Normocephalic and atraumatic.     Right Ear: External ear normal.     Left Ear: External ear normal.     Nose: Nose normal.  Eyes:     General: No scleral  icterus.    Conjunctiva/sclera: Conjunctivae normal.  Neck:     Trachea: Phonation normal.  Cardiovascular:     Rate and Rhythm: Normal rate and regular rhythm.  Pulmonary:     Effort: Pulmonary effort is normal. No respiratory distress.     Breath sounds: No stridor.  Abdominal:     General: There is no distension.     Tenderness: There is abdominal tenderness in the right upper quadrant, right lower quadrant and epigastric area. There is guarding. There is no rebound.  Musculoskeletal:        General: Normal range of motion.     Cervical back: Normal range of motion.  Neurological:     Mental Status: She is alert and oriented to person, place, and time.  Psychiatric:        Behavior: Behavior normal.    ED Results and Treatments Labs (all labs ordered are listed, but only abnormal results are displayed) Labs Reviewed  LIPASE, BLOOD - Abnormal; Notable for the following components:      Result Value   Lipase 1,238 (*)    All other components within normal limits  COMPREHENSIVE METABOLIC PANEL - Abnormal; Notable for the following components:   Potassium 2.9 (*)    CO2 21 (*)    Glucose, Bld 138 (*)    AST 143 (*)    ALT 122 (*)    Total Bilirubin 1.3 (*)    All other components within normal limits  CBC - Abnormal; Notable for the following components:   WBC 17.7 (*)    Platelets 421 (*)    All other components within normal limits  URINALYSIS, ROUTINE W REFLEX MICROSCOPIC - Abnormal; Notable for the following components:   Protein, ur TRACE (*)    All other components within normal limits  RESP PANEL BY RT-PCR (FLU A&B, COVID) ARPGX2  TRIGLYCERIDES  TROPONIN I (HIGH SENSITIVITY)  TROPONIN I (HIGH SENSITIVITY)  EKG  EKG Interpretation  Date/Time:  Tuesday November 25 2021 21:57:17 EST Ventricular Rate:  86 PR Interval:  200 QRS Duration: 96 QT  Interval:  378 QTC Calculation: 452 R Axis:   53 Text Interpretation: Normal sinus rhythm Low voltage QRS No acute changes When compared with ECG of 27-Aug-2014 12:13, Confirmed by Addison Lank [id number]) on 11/26/2021 3:22:17 AM       Radiology CT ABDOMEN PELVIS W CONTRAST  Result Date: 11/26/2021 CLINICAL DATA:  Pancreatitis, acute, severe EXAM: CT ABDOMEN AND PELVIS WITH CONTRAST TECHNIQUE: Multidetector CT imaging of the abdomen and pelvis was performed using the standard protocol following bolus administration of intravenous contrast. RADIATION DOSE REDUCTION: This exam was performed according to the departmental dose-optimization program which includes automated exposure control, adjustment of the mA and/or kV according to patient size and/or use of iterative reconstruction technique. CONTRAST:  156mL OMNIPAQUE IOHEXOL 300 MG/ML  SOLN COMPARISON:  MRI breast 08/21/2014 FINDINGS: Lower chest: Coronary artery calcifications. No acute abnormality. Slightly patulous distal esophagus versus tiny hiatal hernia. Hepatobiliary: The liver is enlarged measuring up to 24 cm. The hepatic parenchyma is slightly diffusely hypodense compared to the splenic parenchyma consistent with fatty infiltration. No focal liver abnormality. Multiple calcified gallstone within the gallbladder lumen. No gallbladder wall thickening or pericholecystic fluid. No biliary dilatation. No CT findings suggest choledocholithiasis. Pancreas: Diffusely atrophic. No focal lesion. Diffuse peripancreatic fat stranding and trace free fluid. No main pancreatic ductal dilatation. No pseudocyst formation. Spleen: Normal in size without focal abnormality. Adrenals/Urinary Tract: No adrenal nodule bilaterally. Bilateral kidneys enhance symmetrically. No hydronephrosis. No hydroureter. The urinary bladder is unremarkable. On delayed imaging, there is no urothelial wall thickening and there are no filling defects in the opacified portions of the  bilateral collecting systems or ureters. Stomach/Bowel: Stomach is within normal limits. No evidence of bowel wall thickening or dilatation. Scattered colonic diverticulosis. Appendix appears normal. Vascular/Lymphatic: No abdominal aorta or iliac aneurysm. Moderate to severe atherosclerotic plaque of the aorta and its branches. No abdominal, pelvic, or inguinal lymphadenopathy. Reproductive: Uterus and bilateral adnexa are unremarkable. Other: No intraperitoneal free fluid. No intraperitoneal free gas. No organized fluid collection. Musculoskeletal: No abdominal wall hernia or abnormality. There is a right breast 4.9 x 2.9 cm fluid dense lesion with associated surgical clips. No suspicious lytic or blastic osseous lesions. No acute displaced fracture. Multilevel degenerative changes of the spine with intervertebral disc space vacuum phenomenon at the L1 through L5 levels. IMPRESSION: 1. Acute pancreatitis.  No pseudocyst formation. 2. Cholelithiasis. 3. Hepatomegaly and hepatic steatosis. 4.  Aortic Atherosclerosis (ICD10-I70.0). 5. A right breast 4.9 x 2.9 cm fluid dense lesion with associated surgical clips. Correlate with mammography. Electronically Signed   By: Iven Finn M.D.   On: 11/26/2021 00:38   US Abdomen Limited RUQ (LIVER/GB)  Result Date: 11/26/2021 CLINICAL DATA:  Right upper quadrant pain, vomiting EXAM: ULTRASOUND ABDOMEN LIMITED RIGHT UPPER QUADRANT COMPARISON:  None. FINDINGS: Gallbladder: Gallstones within the gallbladder measuring up to 1.3 cm. No wall thickening. Common bile duct: Diameter: Normal caliber, 4-5 mm Liver: Mildly increased echotexture throughout the liver suggesting fatty infiltration. No focal hepatic abnormality. Portal vein is patent on color Doppler imaging with normal direction of blood flow towards the liver. Other: None. IMPRESSION: Cholelithiasis. Hepatic steatosis. Electronically Signed   By: Rolm Baptise M.D.   On: 11/26/2021 01:13    Pertinent labs & imaging  results that were available during my care of the patient were reviewed by me and considered  in my medical decision making (see MDM for details).  Medications Ordered in ED Medications  sodium chloride 0.9 % bolus 1,000 mL (1,000 mLs Intravenous New Bag/Given 11/26/21 0150)    Followed by  sodium chloride 0.9 % bolus 1,000 mL (0 mLs Intravenous Stopped 11/26/21 0150)    Followed by  0.9 %  sodium chloride infusion (has no administration in time range)  HYDROmorphone (DILAUDID) injection 0.5 mg (0.5 mg Intravenous Given 11/26/21 0202)  HYDROmorphone (DILAUDID) injection 0.5 mg (0.5 mg Intravenous Given 11/26/21 0011)  iohexol (OMNIPAQUE) 300 MG/ML solution 100 mL (100 mLs Intravenous Contrast Given 11/26/21 0029)  potassium chloride 10 mEq in 100 mL IVPB (10 mEq Intravenous New Bag/Given 11/26/21 0223)                                                                                                                                     Procedures Procedures  (including critical care time)  Medical Decision Making / ED Course        Epigastric abdominal pain We will obtain work-up to assess for pancreatitis and biliary disease, which are most concerning. We will expand if needed after initial work-up.  Work-up ordered to assess concerns above.  Labs and imaging independently interpreted by me and noted below: CBC with leukocytosis. Metabolic panel with mild hypokalemia.  No renal insufficiency. Mildly elevated LFTs and elevated lipase Suspicious for gallstone pancreatitis. CT of the abdomen consistent with pancreatitis.  Also noted gallstones. Right upper quadrant ultrasound confirmed gallstones without evidence of acute cholecystitis or dilated common bile duct concerning for obstruction.  Management: IV Dilaudid High-volume IV fluids IV potassium  Reassessment: Pain improved        Assessment/Plan:                                                                                                                                               Acute pancreatitis Likely from gallstones. We will continue IV hydration. Admitted to medicine for further work-up and management   Final Clinical Impression(s) / ED Diagnoses Final diagnoses:  Abdominal pain  Acute pancreatitis without infection or necrosis, unspecified pancreatitis type  Gallstones  Hypokalemia           This chart was dictated using voice recognition software.  Despite best  efforts to proofread,  errors can occur which can change the documentation meaning.    Fatima Blank, MD 11/26/21 [phone]

## 2021-11-26 NOTE — Plan of Care (Signed)

## 2021-11-26 NOTE — H&P (Addendum)
History and Physical    Miranda Mueller TIR:[id number] DOB: 11-05-1940 DOA: 11/25/2021  PCP: Jearld Fenton, NP Patient coming from:   I have personally briefly reviewed patient's old medical records in [place]  Chief Complaint: stomach pain, n/v  HPI: Miranda Mueller is a 81 y.o. female with medical history significant of remote h/o breast CA s/p lumpectomy and XRT in remission, h/o HTN, HLD, presented to drop her H ED due to abdominal pain nausea and vomiting.  ED Course: Blood pressure (!) 174/87, pulse 85, temperature 98.7 F (37.1 C), temperature source Oral, resp. rate 16, height 5\' 6"  (1.676 m), weight 118.8 kg, SpO2 98 %. Labs showed WBC 17.7, potassium 2.9, elevated lipase 1238, elevated AST and ALT, total bilirubin 1.3, triglyceride 85 High sensitive troponin negative EKG personally reviewed sinus rhythm ,no acute ST-T changes UA no sign of infection Nasal swab negative for flu , negative for COVID  CT ab/pel: 1. Acute pancreatitis.  No pseudocyst formation. 2. Cholelithiasis. 3. Hepatomegaly and hepatic steatosis. 4.  Aortic Atherosclerosis (ICD10-I70.0). 5. A right breast 4.9 x 2.9 cm fluid dense lesion with associated surgical clips. Correlate with mammography.  Ab [place]: Gallstones within the gallbladder measuring up to 1.3 cm. No wall thickening.  Common bile duct: Diameter: Normal caliber, 4-5 mm Hepatic steatosis  She was given IV hydration, antiemetics, analgesics, she is transferred to [facility], she report her pain has much improved, nausea and vomiting has resolved upon arrival to [facility], there is no fever  Review of Systems: As per HPI otherwise all other systems reviewed and are negative.   Past Medical History:  Diagnosis Date   Bradycardia    Breast cancer ([place]) 08/29/14   Right Breast -High Grade Invasive Mammary   Cataract    surgery   GERD (gastroesophageal reflux disease)    History of shingles    Hx of radiation therapy  10/01/14- 11/20/14   right breast/50.4 Gy/28 fx; right breast boost/10 Gy/5 fx   Hyperlipidemia    Hypertension    IBS (irritable bowel syndrome)    Multifocal PVCs    PVC (premature ventricular contraction)    Hx of   Sleep apnea    no cpap per pt     Past Surgical History:  Procedure Laterality Date   BREAST LUMPECTOMY Right 08/29/14   started radiation 10/01/14   CATARACT EXTRACTION  2009   bilateral   COLONOSCOPY  2004-last 2016   normal   KNEE ARTHROSCOPY     right   NEEDLE CORE BIOPSY  RIGHT BREAST Right 08/14/14   OTHER SURGICAL HISTORY Left 1997   breast,cyst   PYLOROPLASTY  2016   RADIOACTIVE SEED GUIDED PARTIAL MASTECTOMY WITH AXILLARY SENTINEL LYMPH NODE BIOPSY Right 08/29/2014   Procedure: SEED LOCALIZED RIGHT BREAST LUMPECTOMY AND RIGHT AXILLARY SENTTINEL LYMPH NODE BIOPSY;  Surgeon: Excell Seltzer, MD;  Location: [place];  Service: General;  Laterality: Right;   WISDOM TOOTH EXTRACTION      Social History  reports that she has never smoked. She has never used smokeless tobacco. She reports that she does not drink alcohol and does not use drugs.  No Known Allergies  Family History  Problem Relation Age of Onset   Hypertension Mother    Heart disease Mother        CAD   Breast cancer Mother 30   Hypertension Sister    Breast cancer Sister 52   Aneurysm Sister  Hypertension Brother    Prostate cancer Brother 55   Leukemia Maternal Uncle 46   Heart disease Maternal Grandmother    Leukemia Maternal Uncle 22   Breast cancer Cousin        dx under 81   Colon cancer Cousin        dx in her 6s   Colon cancer Cousin    Lymphoma Other 9       NHL   Colon polyps Daughter    Stroke Father    Esophageal cancer Neg Hx    Stomach cancer Neg Hx    Rectal cancer Neg Hx     Prior to Admission medications   Medication Sig Start Date End Date Taking? Authorizing Provider  acetaminophen (TYLENOL) 500 MG tablet Take 500 mg by mouth every  6 (six) hours as needed.   Yes [provider]  aspirin 81 MG tablet Take 81 mg by mouth daily.   Yes [provider]  atorvastatin (LIPITOR) 10 MG tablet TAKE 1 TABLET DAILY 04/24/21  Yes Baity, Coralie Keens, NP  cycloSPORINE (RESTASIS) 0.05 % ophthalmic emulsion Place 1 drop into both eyes 2 (two) times daily.     Yes [provider]  esomeprazole (NEXIUM) 40 MG capsule TAKE 1 CAPSULE DAILY AT 12 NOON 07/14/21  Yes Baity, Coralie Keens, NP  fexofenadine (ALLEGRA) 180 MG tablet Take 180 mg by mouth daily.   Yes [provider]  hydrochlorothiazide (HYDRODIURIL) 25 MG tablet TAKE 1 TABLET DAILY 07/07/21  Yes Baity, Coralie Keens, NP  Multiple Vitamins-Minerals (HAIR/SKIN/NAILS PO) Take 1 tablet by mouth daily.   Yes [provider]  olmesartan (BENICAR) 20 MG tablet TAKE 1 TABLET DAILY 07/14/21  Yes Baity, Coralie Keens, NP  Potassium 99 MG TABS Take 1 tablet by mouth daily.     Yes [provider]  Zinc 50 MG CAPS Take by mouth.   Yes [provider]  betamethasone dipropionate 0.05 % cream APPLY TOPICALLY AS NEEDED 09/01/21   Jearld Fenton, NP  fluticasone [facility]) 50 MCG/ACT nasal spray TAKE 2 SPRAYS INTO BOTH NOSTRILS DAILY 07/03/21   Jearld Fenton, NP  methocarbamol (ROBAXIN) 500 MG tablet TAKE 1 TABLET DAILY AS NEEDED FOR MUSCLE SPASMS 12/05/20   Jearld Fenton, NP    Physical Exam: Vitals:   11/26/21 1400 11/26/21 1500 11/26/21 1549 11/26/21 1600  BP: 122/69 124/72 124/72 (!) 142/66  Pulse: 74 75 90 82  Resp: 16 17 (!) 23 (!) 21  Temp:   98.2 F (36.8 C)   TempSrc:   Oral   SpO2: 95% 95% 98% 94%  Weight:      Height:        Constitutional: NAD, calm, comfortable Eyes: PERRL, lids and conjunctivae normal ENMT: Mucous membranes are moist.  Respiratory: clear to auscultation bilaterally, no wheezing, no crackles. Normal respiratory effort. No accessory muscle use.  Cardiovascular: Regular rate and rhythm,  No extremity edema. 2+ pedal  pulses. No carotid bruits.  Abdomen: Mild right upper quadrant tenderness , no rebound , no guarding , positive bowel sounds .  Musculoskeletal: no clubbing / cyanosis. No joint deformity upper and lower extremities. Good ROM, no contractures. Normal muscle tone.  Skin: no rashes, lesions, ulcers. No induration Neurologic: CN 2-12 grossly intact. Sensation intact, Strength 5/5 in all 4.  Psychiatric: Normal judgment and insight. Alert and oriented x 3. Normal mood.    Labs on Admission: I have personally reviewed following labs and imaging studies  CBC: Recent Labs  Lab 11/25/21 2209  WBC 17.7*  HGB 13.4  HCT 40.3  MCV 87.8  PLT 421*    Basic Metabolic Panel: Recent Labs  Lab 11/25/21 2209  NA 135  K 2.9*  CL 99  CO2 21*  GLUCOSE 138*  BUN 17  CREATININE 0.85  CALCIUM 9.3    GFR: Estimated Creatinine Clearance: 70.4 mL/min (by C-G formula based on SCr of 0.85 mg/dL).  Liver Function Tests: Recent Labs  Lab 11/25/21 2209  AST 143*  ALT 122*  ALKPHOS 64  BILITOT 1.3*  PROT 7.4  ALBUMIN 4.0    Urine analysis:    Component Value Date/Time   COLORURINE YELLOW 11/26/2021 0120   APPEARANCEUR CLEAR 11/26/2021 0120   LABSPEC 1.030 11/26/2021 0120   PHURINE 7.0 11/26/2021 0120   GLUCOSEU NEGATIVE 11/26/2021 0120   HGBUR NEGATIVE 11/26/2021 0120   BILIRUBINUR NEGATIVE 11/26/2021 0120   KETONESUR NEGATIVE 11/26/2021 0120   PROTEINUR TRACE (A) 11/26/2021 0120   NITRITE NEGATIVE 11/26/2021 0120   LEUKOCYTESUR NEGATIVE 11/26/2021 0120    Radiological Exams on Admission: CT ABDOMEN PELVIS W CONTRAST  Result Date: 11/26/2021 CLINICAL DATA:  Pancreatitis, acute, severe EXAM: CT ABDOMEN AND PELVIS WITH CONTRAST TECHNIQUE: Multidetector CT imaging of the abdomen and pelvis was performed using the standard protocol following bolus administration of intravenous contrast. RADIATION DOSE REDUCTION: This exam was performed according to the departmental dose-optimization  program which includes automated exposure control, adjustment of the mA and/or kV according to patient size and/or use of iterative reconstruction technique. CONTRAST:  136mL OMNIPAQUE IOHEXOL 300 MG/ML  SOLN COMPARISON:  MRI breast 08/21/2014 FINDINGS: Lower chest: Coronary artery calcifications. No acute abnormality. Slightly patulous distal esophagus versus tiny hiatal hernia. Hepatobiliary: The liver is enlarged measuring up to 24 cm. The hepatic parenchyma is slightly diffusely hypodense compared to the splenic parenchyma consistent with fatty infiltration. No focal liver abnormality. Multiple calcified gallstone within the gallbladder lumen. No gallbladder wall thickening or pericholecystic fluid. No biliary dilatation. No CT findings suggest choledocholithiasis. Pancreas: Diffusely atrophic. No focal lesion. Diffuse peripancreatic fat stranding and trace free fluid. No main pancreatic ductal dilatation. No pseudocyst formation. Spleen: Normal in size without focal abnormality. Adrenals/Urinary Tract: No adrenal nodule bilaterally. Bilateral kidneys enhance symmetrically. No hydronephrosis. No hydroureter. The urinary bladder is unremarkable. On delayed imaging, there is no urothelial wall thickening and there are no filling defects in the opacified portions of the bilateral collecting systems or ureters. Stomach/Bowel: Stomach is within normal limits. No evidence of bowel wall thickening or dilatation. Scattered colonic diverticulosis. Appendix appears normal. Vascular/Lymphatic: No abdominal aorta or iliac aneurysm. Moderate to severe atherosclerotic plaque of the aorta and its branches. No abdominal, pelvic, or inguinal lymphadenopathy. Reproductive: Uterus and bilateral adnexa are unremarkable. Other: No intraperitoneal free fluid. No intraperitoneal free gas. No organized fluid collection. Musculoskeletal: No abdominal wall hernia or abnormality. There is a right breast 4.9 x 2.9 cm fluid dense lesion  with associated surgical clips. No suspicious lytic or blastic osseous lesions. No acute displaced fracture. Multilevel degenerative changes of the spine with intervertebral disc space vacuum phenomenon at the L1 through L5 levels. IMPRESSION: 1. Acute pancreatitis.  No pseudocyst formation. 2. Cholelithiasis. 3. Hepatomegaly and hepatic steatosis. 4.  Aortic Atherosclerosis (ICD10-I70.0). 5. A right breast 4.9 x 2.9 cm fluid dense lesion with associated surgical clips. Correlate with mammography. Electronically Signed   By: Iven Finn M.D.   On: 11/26/2021 00:38   US Abdomen Limited RUQ (LIVER/GB)  Result Date: 11/26/2021 CLINICAL DATA:  Right upper quadrant pain, vomiting EXAM: ULTRASOUND ABDOMEN LIMITED RIGHT UPPER QUADRANT COMPARISON:  None. FINDINGS: Gallbladder: Gallstones within the gallbladder measuring up to 1.3 cm. No wall thickening. Common bile duct: Diameter: Normal caliber, 4-5 mm Liver: Mildly increased echotexture throughout the liver suggesting fatty infiltration. No focal hepatic abnormality. Portal vein is patent on color Doppler imaging with normal direction of blood flow towards the liver. Other: None. IMPRESSION: Cholelithiasis. Hepatic steatosis. Electronically Signed   By: Rolm Baptise M.D.   On: 11/26/2021 01:13      Assessment/Plan Principal Problem:   Acute pancreatitis    Gallstone pancreatitis -Symptoms improving, will recheck lipase and CMP -She desire to have some Jell-O today, will order clear liquid as tolerated tonight -As needed analgesic and antiemetics -Case discussed with general surgery Dr Georgette Dover who will see in the morning, will keep npo after midnight  Hypokalemia Hold HCTZ, received supplement in the ED recheck K and check mag  Hypertension Hold Benicar perioperatively and in the setting of acute pancreatitis Hold HCTZ due to hypokalemia and n.p.o. status Started on as needed labetalol  Hyperlipidemia Hold statin due to abnormal LFT  A  right breast 4.9 x 2.9 cm fluid dense lesion with associated surgical clips.  Seen on CT this time She denies any discomfort, does not aware of any new lump on breast exam H/o right breast CA s/p lumpectomy and radiation, in remission, she was discharged from oncology clinic 5 years ago, last mammogram in June 2022 unremarkable, she reports have a mammogram scheduled in June this year Please contact oncology Dr. Lindi Adie tomorrow to see is anything need to be done while she is in the hospital  Body mass index is 42.29 kg/m.  Meet class III obesity criteria Denies OSA history     DVT prophylaxis:   SCDs due to anticipating surgery   Code Status:   DNR confirmed with patient Family Communication:  Patient  Patient is from: Home    Anticipated DC to: Home   Anticipated DC date: Greater than 48 hours    Consults called:  General surgery Admission status:  Inpatient  Severity of Illness:   The appropriate patient status for this patient is INPATIENT due to history and comorbidities, severity of illness, required intensity of service to ensure the patient's safety and to avoid risk of adverse events/further clinical deterioration.  Severity of illness/comorbidities: Gallstone pancreatitis Intensity of service: tests, high frequency of surveillance, interventions It is not anticipated that the patient will be medically stable for discharge from the hospital within 2 midnights of admission.    Voice Recognition Viviann Spare dictation system was used to create this note, attempts have been made to correct errors. Please contact the author with questions and/or clarifications.  Florencia Reasons MD PhD FACP [facility]  How to contact the [facility] Attending or Consulting provider [place] or covering provider during after hours [place], for this patient?   Check the care team in [facility] and look for a) attending/consulting [facility] provider listed and b) the [facility] team listed Log into [email or web address] and use [facility]'s universal password to access. If you do not have the password, please contact the hospital operator. Locate the [facility] provider you are looking for under [facility] and page to a number that you can be directly reached. If you still have difficulty reaching the provider, please page the [facility] (Director on Call) for the Hospitalists listed on amion for assistance.  11/26/2021,  5:02 PM

## 2021-11-26 NOTE — ED Notes (Signed)
Handoff given to care link.  Attempt report to floor RN no answer.  Will attempt again

## 2021-11-26 NOTE — ED Notes (Signed)
Patient transported to CT via stretcher with tech

## 2021-11-26 NOTE — Plan of Care (Signed)
°  Problem: Education: °Goal: Knowledge of General Education information will improve °Description: Including pain rating scale, medication(s)/side effects and non-pharmacologic comfort measures °Outcome: Progressing °  °Problem: Health Behavior/Discharge Planning: °Goal: Ability to manage health-related needs will improve °Outcome: Progressing °  °Problem: Clinical Measurements: °Goal: Ability to maintain clinical measurements within normal limits will improve °Outcome: Progressing °Goal: Respiratory complications will improve °Outcome: Progressing °  °Problem: Activity: °Goal: Risk for activity intolerance will decrease °Outcome: Progressing °  °Problem: Pain Managment: °Goal: General experience of comfort will improve °Outcome: Progressing °  °Problem: Safety: °Goal: Ability to remain free from injury will improve °Outcome: Progressing °  °Problem: Skin Integrity: °Goal: Risk for impaired skin integrity will decrease °Outcome: Progressing °  °

## 2021-11-26 NOTE — Progress Notes (Signed)
Pt arrived to room 1517 via [facility] from [place]. Pt ambulated unassisted to bed. Received report from [place], Therapist, sports. See assessment. Will continue to monitor.

## 2021-11-26 NOTE — ED Notes (Signed)
Handoff report given to Lindajo Royal on 5E at [facility]

## 2021-11-27 ENCOUNTER — Encounter (HOSPITAL_COMMUNITY): Payer: Self-pay | Admitting: Internal Medicine

## 2021-11-27 DIAGNOSIS — Z853 Personal history of malignant neoplasm of breast: Secondary | ICD-10-CM | POA: Diagnosis not present

## 2021-11-27 DIAGNOSIS — K851 Biliary acute pancreatitis without necrosis or infection: Secondary | ICD-10-CM | POA: Diagnosis not present

## 2021-11-27 DIAGNOSIS — E876 Hypokalemia: Secondary | ICD-10-CM | POA: Diagnosis not present

## 2021-11-27 LAB — COMPREHENSIVE METABOLIC PANEL
ALT: 97 U/L — ABNORMAL HIGH (ref 0–44)
AST: 58 U/L — ABNORMAL HIGH (ref 15–41)
Albumin: 3.1 g/dL — ABNORMAL LOW (ref 3.5–5.0)
Alkaline Phosphatase: 67 U/L (ref 38–126)
Anion gap: 6 (ref 5–15)
BUN: 13 mg/dL (ref 8–23)
CO2: 24 mmol/L (ref 22–32)
Calcium: 7.9 mg/dL — ABNORMAL LOW (ref 8.9–10.3)
Chloride: 107 mmol/L (ref 98–111)
Creatinine, Ser: 0.7 mg/dL (ref 0.44–1.00)
GFR, Estimated: >60 mL/min (ref >60)
Glucose, Bld: 95 mg/dL (ref 70–99)
Potassium: 2.8 mmol/L — ABNORMAL LOW (ref 3.5–5.1)
Sodium: 137 mmol/L (ref 135–145)
Total Bilirubin: 3.3 mg/dL — ABNORMAL HIGH (ref 0.3–1.2)
Total Protein: 6.1 g/dL — ABNORMAL LOW (ref 6.5–8.1)

## 2021-11-27 LAB — CBC
HCT: 32.1 % — ABNORMAL LOW (ref 36.0–46.0)
Hemoglobin: 10.9 g/dL — ABNORMAL LOW (ref 12.0–15.0)
MCH: 29.8 pg (ref 26.0–34.0)
MCHC: 34 g/dL (ref 30.0–36.0)
MCV: 87.7 fL (ref 80.0–100.0)
Platelets: 276 10*3/uL (ref 150–400)
RBC: 3.66 MIL/uL — ABNORMAL LOW (ref 3.87–5.11)
RDW: 13.5 % (ref 11.5–15.5)
WBC: 10.7 10*3/uL — ABNORMAL HIGH (ref 4.0–10.5)
nRBC: 0 % (ref 0.0–0.2)

## 2021-11-27 LAB — PROTIME-INR
INR: 1.2 (ref 0.8–1.2)
Prothrombin Time: 15 seconds (ref 11.4–15.2)

## 2021-11-27 LAB — MAGNESIUM: Magnesium: 1.8 mg/dL (ref 1.7–2.4)

## 2021-11-27 LAB — LIPASE, BLOOD: Lipase: 496 U/L — ABNORMAL HIGH (ref 11–51)

## 2021-11-27 MED ORDER — CHLORHEXIDINE GLUCONATE CLOTH 2 % EX PADS: 6.0000 | MEDICATED_PAD | Freq: Once | CUTANEOUS | Status: DC

## 2021-11-27 MED ORDER — BUPIVACAINE LIPOSOME 1.3 % IJ SUSP: 20.0000 mL | Freq: Once | INTRAMUSCULAR | Status: DC

## 2021-11-27 MED ORDER — BUPIVACAINE LIPOSOME 1.3 % IJ SUSP: 20.0000 mL | INTRAMUSCULAR | Status: DC

## 2021-11-27 MED ORDER — ACETAMINOPHEN 500 MG PO TABS
1000.0000 mg | ORAL_TABLET | ORAL | Status: AC
  Administered 2021-11-28: 1000 mg via ORAL
  Filled 2021-11-27: qty 2

## 2021-11-27 MED ORDER — POTASSIUM CHLORIDE 10 MEQ/100ML IV SOLN
INTRAVENOUS | Status: AC
  Filled 2021-11-27: qty 100

## 2021-11-27 MED ORDER — POTASSIUM CHLORIDE 10 MEQ/100ML IV SOLN
10.0000 meq | INTRAVENOUS | Status: AC
  Administered 2021-11-27 (×6): 10 meq via INTRAVENOUS
  Filled 2021-11-27 (×5): qty 100

## 2021-11-27 MED ORDER — CHLORHEXIDINE GLUCONATE CLOTH 2 % EX PADS
6.0000 | MEDICATED_PAD | Freq: Once | CUTANEOUS | Status: AC
  Administered 2021-11-27: 6 via TOPICAL

## 2021-11-27 MED ORDER — SODIUM CHLORIDE 0.9 % IV SOLN
2.0000 g | INTRAVENOUS | Status: AC
  Administered 2021-11-28: 2 g via INTRAVENOUS
  Filled 2021-11-27: qty 20

## 2021-11-27 MED ORDER — GABAPENTIN 300 MG PO CAPS
300.0000 mg | ORAL_CAPSULE | ORAL | Status: AC
  Administered 2021-11-28: 300 mg via ORAL
  Filled 2021-11-27: qty 1

## 2021-11-27 MED ORDER — POTASSIUM CHLORIDE 20 MEQ PO PACK
40.0000 meq | PACK | Freq: Once | ORAL | Status: AC
  Administered 2021-11-27: 40 meq via ORAL
  Filled 2021-11-27: qty 2

## 2021-11-27 MED ORDER — ENSURE PRE-SURGERY PO LIQD
296.0000 mL | Freq: Once | ORAL | Status: DC
  Filled 2021-11-27: qty 296

## 2021-11-27 MED ORDER — ACETAMINOPHEN 500 MG PO TABS: 1000.0000 mg | ORAL_TABLET | ORAL | Status: DC

## 2021-11-27 MED ORDER — MAGNESIUM SULFATE IN D5W 1-5 GM/100ML-% IV SOLN
1.0000 g | Freq: Once | INTRAVENOUS | Status: AC
  Administered 2021-11-27: 1 g via INTRAVENOUS
  Filled 2021-11-27: qty 100

## 2021-11-27 MED ORDER — ENSURE PRE-SURGERY PO LIQD
296.0000 mL | Freq: Once | ORAL | Status: AC
  Administered 2021-11-28: 296 mL via ORAL
  Filled 2021-11-27: qty 296

## 2021-11-27 NOTE — Plan of Care (Signed)

## 2021-11-27 NOTE — Progress Notes (Signed)
PROGRESS NOTE    Miranda Mueller  PIR:[id number] DOB: [birth year] DOA: 11/25/2021 PCP: Jearld Fenton, NP     Brief Narrative:   Miranda Mueller is a 81 y.o. female with medical history significant of remote h/o breast CA s/p lumpectomy and XRT in remission, h/o HTN, HLD, presented to drop her H ED due to abdominal pain nausea and vomiting, found to have gallstone pancreatitis  Subjective: I have Reviewed nursing notes, Vitals, and Lab results since pt's last encounter. Pertinent lab results CBC, CMP,   She is feeling better this morning, pain is much improved, no nausea, no vomiting, no fever Family at bedside  Assessment & Plan:  Principal Problem:   Acute pancreatitis Active Problems:   Essential hypertension   History of breast cancer   Gallstone pancreatitis   Hypokalemia   Gallstone pancreatitis -Symptoms improving,  lipase and CMP improving --As needed analgesic and antiemetics -Management per general surgery ,appreciate general surgery input   Hypokalemia/hypomagnesemia Hold HCTZ, received supplement in the ED Remain low, continue replace, recheck in the morning    Hypertension Hold Benicar perioperatively and in the setting of acute pancreatitis Hold HCTZ due to hypokalemia  Continue as needed labetalol Blood pressure stable so far without BP meds, monitor   Hyperlipidemia Hold statin due to abnormal LFT   A right breast 4.9 x 2.9 cm fluid dense lesion with associated surgical clips.  Seen on CT this time She denies any discomfort, does not aware of any new lump on breast exam H/o right breast CA s/p lumpectomy and radiation, in remission, she was discharged from oncology clinic 5 years ago, last mammogram in June 2022 unremarkable, she reports have a mammogram scheduled in June this year Case discussed with oncology Dr. Lindi Adie over the phone who will arrange outpatient mammogram after she is discharged from the hospital   Body mass index is 42.29 kg/m.   Meet class III obesity criteria Denies OSA history     I ordered the following labs:  Unresulted Labs (From admission, onward)     Start     Ordered   11/28/21 0500  Comprehensive metabolic panel  Tomorrow morning,   R        11/27/21 0954   11/28/21 0500  CBC  Tomorrow morning,   R        11/27/21 0954   11/28/21 0500  Lipase, blood  Tomorrow morning,   R        11/27/21 1244   11/28/21 0500  Magnesium  Tomorrow morning,   R        11/27/21 1840             DVT prophylaxis: SCD's Start: 11/27/21 0955 SCDs Start: 11/26/21 1824   Code Status:   Code Status: DNR  Family Communication: husband and daughter at bedside on 2/16 Disposition:   Status is: Inpatient   Dispo: The patient is from: Home              Anticipated d/c is to: Home              Anticipated d/c date is: Greater than 24 hours  Antimicrobials:   Anti-infectives (From admission, onward)    Start     Dose/Rate Route Frequency Ordered Stop   11/28/21 0600  cefTRIAXone (ROCEPHIN) 2 g in sodium chloride 0.9 % 100 mL IVPB        2 g 200 mL/hr over 30 Minutes Intravenous On call to O.R. 11/27/21  [id number] 11/29/21 0559           Objective: Vitals:   11/26/21 2039 11/27/21 0102 11/27/21 0433 11/27/21 1527  BP: (!) 144/70 (!) 147/72 136/63 (!) 130/51  Pulse: 80 81 85 84  Resp: 16 16 16 16   Temp: 98.9 F (37.2 C) 98.1 F (36.7 C) 99.4 F (37.4 C) 98.2 F (36.8 C)  TempSrc: Oral Oral Oral Oral  SpO2: 95% 95% 96% 97%  Weight:      Height:        Intake/Output Summary (Last 24 hours) at 11/27/2021 1844 Last data filed at 11/27/2021 1111 Gross per 24 hour  Intake 2694.46 ml  Output --  Net 2694.46 ml   Filed Weights   11/25/21 2149  Weight: 118.8 kg    Examination:  General exam: alert, awake, communicative,calm, NAD Respiratory system: Clear to auscultation. Respiratory effort normal. Cardiovascular system:  RRR.  Gastrointestinal system: Abdomen is nondistended, soft and nontender.   Normal bowel sounds heard. Central nervous system: Alert and oriented. No focal neurological deficits. Extremities:  no edema Skin: No rashes, lesions or ulcers Psychiatry: Judgement and insight appear normal. Mood & affect appropriate.     Data Reviewed: I have personally reviewed  labs and visualized  imaging studies since the last encounter and formulate the plan        Scheduled Meds:  [START ON 11/28/2021] acetaminophen  1,000 mg Oral On Call to OR   [START ON 11/28/2021] bupivacaine liposome  20 mL Infiltration On Call to OR   Chlorhexidine Gluconate Cloth  6 each Topical Once   And   Chlorhexidine Gluconate Cloth  6 each Topical Once   [START ON 11/28/2021] Chlorhexidine Gluconate Cloth  6 each Topical Once   cycloSPORINE  1 drop Both Eyes BID   [START ON 11/28/2021] feeding supplement  296 mL Oral Once   [START ON 11/28/2021] gabapentin  300 mg Oral On Call to OR   loratadine  10 mg Oral Daily   pantoprazole  40 mg Oral Daily   zinc sulfate  220 mg Oral Daily   Continuous Infusions:  sodium chloride 1,000 mL (11/27/21 0434)   [START ON 11/28/2021] cefTRIAXone (ROCEPHIN)  IV     potassium chloride       LOS: 1 day      Florencia Reasons, MD PhD FACP [facility]  Available via Epic secure chat 7am-7pm for nonurgent issues Please page for urgent issues To page the attending provider between 7A-7P or the covering provider during after hours 7P-7A, please log into the web site [email or web address] and access using universal [place] password for that web site. If you do not have the password, please call the hospital operator.    11/27/2021, 6:44 PM

## 2021-11-27 NOTE — Progress Notes (Signed)
°   11/27/21 1100  Mobility  Activity Ambulated with assistance in hallway  Level of Assistance Standby assist, set-up cues, supervision of patient - no hands on  Assistive Device Other (Comment) (IV pole)  Distance Ambulated (ft) 300 ft  Activity Response Tolerated well  $Mobility charge 1 Mobility   Pt agreeable to mobilize this morning. Ambulated about 363ft in hall with RW, tolerated well. She did note some SOB on exertion, otherwise no complaints. Left pt in bed, call bell at side. RN/NT notified of session.    [place] Specialist Acute Rehab Services Office: [phone]

## 2021-11-27 NOTE — H&P (View-Only) (Signed)
Consult Note  Miranda Mueller Jun 27, 1941  [id number].    Requesting MD: Dr. Erlinda Hong Chief Complaint/Reason for Consult: gallstone pancreatitis  HPI:  81 year old female with medical history significant for HTN, HLD, GERD, IBS, breast cancer s/p lumpectomy and XRT in remission who presented to [facility] ED due to acute severe abdominal pain, nausea, and emesis beginning the day of presentation. She notes having increasing abdominal pain in epigastrium and RUQ and nausea/emesis after eating over the last several weeks but had attributed her symptoms to IBS and decided to come to the ED due to increased severity of abdominal pain. She has had some associated chills. ROS otherwise as below.  Work up in ED significant for: WBC 17.7 (now 10.7), elevated LFTs including t bili 1.3 (now 3.3), lipase 1238 (now 496). US showing cholelithiasis, CT scan showing acute pancreatitis, hepatomegaly and hepatic steatosis. She was transferred to [facility] and admitted to [facility] service for further evaluation and treatment. General surgery asked to see in regard to gallstone pancreatitis.  Yesterday her pain improved and she drank clear liquids without abdominal pain. No further nausea or emesis. She has not needed pain medications this morning so far.  Substance use: none Allergies: none Blood thinners: none Past Surgeries: no prior abdominal surgeries   ROS: Review of Systems  Constitutional:  Positive for chills. Negative for fever.  Respiratory:  Negative for cough, shortness of breath and wheezing.   Cardiovascular:  Negative for chest pain, palpitations and leg swelling.  Gastrointestinal:  Positive for abdominal pain, nausea and vomiting. Negative for blood in stool, constipation, diarrhea and melena.  Genitourinary: Negative.    Family History  Problem Relation Age of Onset   Hypertension Mother    Heart disease Mother        CAD   Breast cancer Mother 35   Hypertension Sister    Breast  cancer Sister 22   Aneurysm Sister    Hypertension Brother    Prostate cancer Brother 3   Leukemia Maternal Uncle 43   Heart disease Maternal Grandmother    Leukemia Maternal Uncle 66   Breast cancer Cousin        dx under 44   Colon cancer Cousin        dx in her 58s   Colon cancer Cousin    Lymphoma Other 15       NHL   Colon polyps Daughter    Stroke Father    Esophageal cancer Neg Hx    Stomach cancer Neg Hx    Rectal cancer Neg Hx     Past Medical History:  Diagnosis Date   Bradycardia    Breast cancer ([place]) 08/29/14   Right Breast -High Grade Invasive Mammary   Cataract    surgery   GERD (gastroesophageal reflux disease)    History of shingles    Hx of radiation therapy 10/01/14- 11/20/14   right breast/50.4 Gy/28 fx; right breast boost/10 Gy/5 fx   Hyperlipidemia    Hypertension    IBS (irritable bowel syndrome)    Multifocal PVCs    PVC (premature ventricular contraction)    Hx of   Sleep apnea    no cpap per pt     Past Surgical History:  Procedure Laterality Date   BREAST LUMPECTOMY Right 08/29/14   started radiation 10/01/14   CATARACT EXTRACTION  2009   bilateral   COLONOSCOPY  2004-last 2016   normal   KNEE ARTHROSCOPY  right   NEEDLE CORE BIOPSY  RIGHT BREAST Right 08/14/14   OTHER SURGICAL HISTORY Left 1997   breast,cyst   PYLOROPLASTY  2016   RADIOACTIVE SEED GUIDED PARTIAL MASTECTOMY WITH AXILLARY SENTINEL LYMPH NODE BIOPSY Right 08/29/2014   Procedure: SEED LOCALIZED RIGHT BREAST LUMPECTOMY AND RIGHT AXILLARY SENTTINEL LYMPH NODE BIOPSY;  Surgeon: Excell Seltzer, MD;  Location: [place];  Service: General;  Laterality: Right;   WISDOM TOOTH EXTRACTION      Social History:  reports that she has never smoked. She has never used smokeless tobacco. She reports that she does not drink alcohol and does not use drugs.  Allergies: No Known Allergies  Medications Prior to Admission  Medication Sig Dispense Refill    acetaminophen (TYLENOL) 500 MG tablet Take 500 mg by mouth every 6 (six) hours as needed.     aspirin 81 MG tablet Take 81 mg by mouth daily.     atorvastatin (LIPITOR) 10 MG tablet TAKE 1 TABLET DAILY 90 tablet 3   cycloSPORINE (RESTASIS) 0.05 % ophthalmic emulsion Place 1 drop into both eyes 2 (two) times daily.       esomeprazole (NEXIUM) 40 MG capsule TAKE 1 CAPSULE DAILY AT 12 NOON 90 capsule 2   fexofenadine (ALLEGRA) 180 MG tablet Take 180 mg by mouth daily.     hydrochlorothiazide (HYDRODIURIL) 25 MG tablet TAKE 1 TABLET DAILY 90 tablet 2   Multiple Vitamins-Minerals (HAIR/SKIN/NAILS PO) Take 1 tablet by mouth daily.     olmesartan (BENICAR) 20 MG tablet TAKE 1 TABLET DAILY 90 tablet 1   Potassium 99 MG TABS Take 1 tablet by mouth daily.       Zinc 50 MG CAPS Take by mouth.     betamethasone dipropionate 0.05 % cream APPLY TOPICALLY AS NEEDED 30 g 11   fluticasone (FLONASE) 50 MCG/ACT nasal spray TAKE 2 SPRAYS INTO BOTH NOSTRILS DAILY 16 g 4   methocarbamol (ROBAXIN) 500 MG tablet TAKE 1 TABLET DAILY AS NEEDED FOR MUSCLE SPASMS 30 tablet 11    Blood pressure 136/63, pulse 85, temperature 99.4 F (37.4 C), temperature source Oral, resp. rate 16, height 5\' 6"  (1.676 m), weight 118.8 kg, SpO2 96 %. Physical Exam: General: pleasant, WD, female who is laying in bed in NAD HEENT: head is normocephalic, atraumatic.  Sclera are noninjected.  Pupils equal and round. EOMs intact.  Ears and nose without any masses or lesions.  Mouth is pink and moist Heart: regular, rate, and rhythm.  Normal s1,s2. No obvious murmurs, gallops, or rubs noted.  Palpable radial and pedal pulses bilaterally Lungs: CTAB, no wheezes, rhonchi, or rales noted.  Respiratory effort nonlabored Abd: soft, ND, +BS, no masses, hernias, or organomegaly. Very mild TTP in epigastrium, RUQ, RLQ without rebound or guarding MSK: all 4 extremities are symmetrical with no cyanosis, clubbing, or edema. Skin: warm and dry with no  masses, lesions, or rashes Neuro: Cranial nerves 2-12 grossly intact, sensation is normal throughout Psych: A&Ox3 with an appropriate affect.    Results for orders placed or performed during the hospital encounter of 11/25/21 (from the past 48 hour(s))  Lipase, blood     Status: Abnormal   Collection Time: 11/25/21 10:09 PM  Result Value Ref Range   Lipase 1,238 (H) 11 - 51 U/L    Comment: RESULTS CONFIRMED BY MANUAL DILUTION REPEATED TO VERIFY Performed at [facility], [address] [place], [place] [address]   Comprehensive metabolic panel     Status:  Abnormal   Collection Time: 11/25/21 10:09 PM  Result Value Ref Range   Sodium 135 135 - 145 mmol/L   Potassium 2.9 (L) 3.5 - 5.1 mmol/L   Chloride 99 98 - 111 mmol/L   CO2 21 (L) 22 - 32 mmol/L   Glucose, Bld 138 (H) 70 - 99 mg/dL    Comment: Glucose reference range applies only to samples taken after fasting for at least 8 hours.   BUN 17 8 - 23 mg/dL   Creatinine, Ser 0.85 0.44 - 1.00 mg/dL   Calcium 9.3 8.9 - 10.3 mg/dL   Total Protein 7.4 6.5 - 8.1 g/dL   Albumin 4.0 3.5 - 5.0 g/dL   AST 143 (H) 15 - 41 U/L   ALT 122 (H) 0 - 44 U/L   Alkaline Phosphatase 64 38 - 126 U/L   Total Bilirubin 1.3 (H) 0.3 - 1.2 mg/dL   GFR, Estimated >60 >60 mL/min    Comment: (NOTE) Calculated using the CKD-EPI Creatinine Equation (2021)    Anion gap 15 5 - 15    Comment: Performed at [facility], [address], [place], [place] [address]  CBC     Status: Abnormal   Collection Time: 11/25/21 10:09 PM  Result Value Ref Range   WBC 17.7 (H) 4.0 - 10.5 K/uL   RBC 4.59 3.87 - 5.11 MIL/uL   Hemoglobin 13.4 12.0 - 15.0 g/dL   HCT 40.3 36.0 - 46.0 %   MCV 87.8 80.0 - 100.0 fL   MCH 29.2 26.0 - 34.0 pg   MCHC 33.3 30.0 - 36.0 g/dL   RDW 13.2 11.5 - 15.5 %   Platelets 421 (H) 150 - 400 K/uL   nRBC 0.0 0.0 - 0.2 %    Comment: Performed at [facility], [place], [place] [address]  Troponin I (High Sensitivity)     Status: None   Collection Time: 11/25/21 10:09 PM  Result Value Ref Range   Troponin I (High Sensitivity) 2 <18 ng/L    Comment: (NOTE) Elevated high sensitivity troponin I (hsTnI) values and significant  changes across serial measurements may suggest ACS but many other  chronic and acute conditions are known to elevate hsTnI results.  Refer to the "Links" section for chest pain algorithms and additional  guidance. Performed at [facility], [address], [place], [place] [address]   Troponin I (High Sensitivity)     Status: None   Collection Time: 11/26/21 12:13 AM  Result Value Ref Range   Troponin I (High Sensitivity) 4 <18 ng/L    Comment: (NOTE) Elevated high sensitivity troponin I (hsTnI) values and significant  changes across serial measurements may suggest ACS but many other  chronic and acute conditions are known to elevate hsTnI results.  Refer to the "Links" section for chest pain algorithms and additional  guidance. Performed at [facility], [address], [place], [place] [address]   Urinalysis, Routine w reflex microscopic Urine, Clean Catch     Status: Abnormal   Collection Time: 11/26/21  1:20 AM  Result Value Ref Range   Color, Urine YELLOW YELLOW   APPearance CLEAR CLEAR   Specific Gravity, Urine 1.030 1.005 - 1.030   pH 7.0 5.0 - 8.0   Glucose, UA NEGATIVE NEGATIVE mg/dL   Hgb urine dipstick NEGATIVE NEGATIVE   Bilirubin Urine NEGATIVE NEGATIVE   Ketones, ur NEGATIVE NEGATIVE mg/dL   Protein, ur TRACE (A) NEGATIVE mg/dL  Nitrite NEGATIVE NEGATIVE   Leukocytes,Ua NEGATIVE NEGATIVE    Comment: Performed at [facility], [address] [place], [place] [address]  Resp Panel by RT-PCR (Flu A&B, Covid) Nasopharyngeal Swab     Status: None   Collection Time: 11/26/21  1:22 AM   Specimen: Nasopharyngeal Swab; Nasopharyngeal(NP) swabs in vial  transport medium  Result Value Ref Range   SARS Coronavirus 2 by RT PCR NEGATIVE NEGATIVE    Comment: (NOTE) SARS-CoV-2 target nucleic acids are NOT DETECTED.  The SARS-CoV-2 RNA is generally detectable in upper respiratory specimens during the acute phase of infection. The lowest concentration of SARS-CoV-2 viral copies this assay can detect is 138 copies/mL. A negative result does not preclude SARS-Cov-2 infection and should not be used as the sole basis for treatment or other patient management decisions. A negative result may occur with  improper specimen collection/handling, submission of specimen other than nasopharyngeal swab, presence of viral mutation(s) within the areas targeted by this assay, and inadequate number of viral copies(<138 copies/mL). A negative result must be combined with clinical observations, patient history, and epidemiological information. The expected result is Negative.  Fact Sheet for Patients:  EntrepreneurPulse.com.au  Fact Sheet for Healthcare Providers:  IncredibleEmployment.be  This test is no t yet approved or cleared by the [place] FDA and  has been authorized for detection and/or diagnosis of SARS-CoV-2 by FDA under an Emergency Use Authorization (EUA). This EUA will remain  in effect (meaning this test can be used) for the duration of the COVID-19 declaration under Section 564(b)(1) of the Act, 21 U.S.C.section 360bbb-3(b)(1), unless the authorization is terminated  or revoked sooner.       Influenza A by PCR NEGATIVE NEGATIVE   Influenza B by PCR NEGATIVE NEGATIVE    Comment: (NOTE) The Xpert Xpress SARS-CoV-2/FLU/RSV plus assay is intended as an aid in the diagnosis of influenza from Nasopharyngeal swab specimens and should not be used as a sole basis for treatment. Nasal washings and aspirates are unacceptable for Xpert Xpress SARS-CoV-2/FLU/RSV testing.  Fact Sheet for  Patients: EntrepreneurPulse.com.au  Fact Sheet for Healthcare Providers: IncredibleEmployment.be  This test is not yet approved or cleared by the [place] FDA and has been authorized for detection and/or diagnosis of SARS-CoV-2 by FDA under an Emergency Use Authorization (EUA). This EUA will remain in effect (meaning this test can be used) for the duration of the COVID-19 declaration under Section 564(b)(1) of the Act, 21 U.S.C. section 360bbb-3(b)(1), unless the authorization is terminated or revoked.  Performed at [facility], [address] [place], [place] [address]   Comprehensive metabolic panel     Status: Abnormal   Collection Time: 11/26/21  5:36 PM  Result Value Ref Range   Sodium 136 135 - 145 mmol/L   Potassium 3.2 (L) 3.5 - 5.1 mmol/L   Chloride 103 98 - 111 mmol/L   CO2 25 22 - 32 mmol/L   Glucose, Bld 100 (H) 70 - 99 mg/dL    Comment: Glucose reference range applies only to samples taken after fasting for at least 8 hours.   BUN 16 8 - 23 mg/dL   Creatinine, Ser 0.77 0.44 - 1.00 mg/dL   Calcium 8.3 (L) 8.9 - 10.3 mg/dL   Total Protein 7.1 6.5 - 8.1 g/dL   Albumin 3.3 (L) 3.5 - 5.0 g/dL   AST 91 (H) 15 - 41 U/L   ALT 134 (H) 0 - 44 U/L   Alkaline Phosphatase 69 38 -  126 U/L   Total Bilirubin 3.7 (H) 0.3 - 1.2 mg/dL   GFR, Estimated >60 >60 mL/min    Comment: (NOTE) Calculated using the CKD-EPI Creatinine Equation (2021)    Anion gap 8 5 - 15    Comment: Performed at [facility], [place] [address], [place], [place] [address]  Lipase, blood     Status: Abnormal   Collection Time: 11/26/21  5:36 PM  Result Value Ref Range   Lipase 1,069 (H) 11 - 51 U/L    Comment: RESULTS CONFIRMED BY MANUAL DILUTION Performed at [facility], [place] [address], [place], [place] [address]   Magnesium     Status: None   Collection Time: 11/26/21  5:36 PM  Result Value Ref Range    Magnesium 1.8 1.7 - 2.4 mg/dL    Comment: Performed at [facility], [place] [address] [place], [place] [address]  Magnesium     Status: None   Collection Time: 11/27/21  4:46 AM  Result Value Ref Range   Magnesium 1.8 1.7 - 2.4 mg/dL    Comment: Performed at [facility], [place] [address], [place], [place] [address]  CBC     Status: Abnormal   Collection Time: 11/27/21  4:46 AM  Result Value Ref Range   WBC 10.7 (H) 4.0 - 10.5 K/uL   RBC 3.66 (L) 3.87 - 5.11 MIL/uL   Hemoglobin 10.9 (L) 12.0 - 15.0 g/dL   HCT 32.1 (L) 36.0 - 46.0 %   MCV 87.7 80.0 - 100.0 fL   MCH 29.8 26.0 - 34.0 pg   MCHC 34.0 30.0 - 36.0 g/dL   RDW 13.5 11.5 - 15.5 %   Platelets 276 150 - 400 K/uL   nRBC 0.0 0.0 - 0.2 %    Comment: Performed at [facility], [place] [address] [place], [place] [address]  Comprehensive metabolic panel     Status: Abnormal   Collection Time: 11/27/21  4:46 AM  Result Value Ref Range   Sodium 137 135 - 145 mmol/L   Potassium 2.8 (L) 3.5 - 5.1 mmol/L   Chloride 107 98 - 111 mmol/L   CO2 24 22 - 32 mmol/L   Glucose, Bld 95 70 - 99 mg/dL    Comment: Glucose reference range applies only to samples taken after fasting for at least 8 hours.   BUN 13 8 - 23 mg/dL   Creatinine, Ser 0.70 0.44 - 1.00 mg/dL   Calcium 7.9 (L) 8.9 - 10.3 mg/dL   Total Protein 6.1 (L) 6.5 - 8.1 g/dL   Albumin 3.1 (L) 3.5 - 5.0 g/dL   AST 58 (H) 15 - 41 U/L   ALT 97 (H) 0 - 44 U/L   Alkaline Phosphatase 67 38 - 126 U/L   Total Bilirubin 3.3 (H) 0.3 - 1.2 mg/dL   GFR, Estimated >60 >60 mL/min    Comment: (NOTE) Calculated using the CKD-EPI Creatinine Equation (2021)    Anion gap 6 5 - 15    Comment: Performed at [facility], [place] [address] [place], [place] [address]  Protime-INR     Status: None   Collection Time: 11/27/21  4:46 AM  Result Value Ref Range   Prothrombin Time 15.0 11.4 - 15.2 seconds   INR 1.2 0.8 - 1.2     Comment: (NOTE) INR goal varies based on device and disease states. Performed at [facility], [place] [address] [place],  [place] [address]   Lipase, blood     Status: Abnormal   Collection Time: 11/27/21  4:46 AM  Result Value Ref Range   Lipase 496 (H) 11 - 51 U/L    Comment: RESULTS CONFIRMED BY MANUAL DILUTION Performed at [facility], [place] [address] [place],  [address]    CT ABDOMEN PELVIS W CONTRAST  Result Date: 11/26/2021 CLINICAL DATA:  Pancreatitis, acute, severe EXAM: CT ABDOMEN AND PELVIS WITH CONTRAST TECHNIQUE: Multidetector CT imaging of the abdomen and pelvis was performed using the standard protocol following bolus administration of intravenous contrast. RADIATION DOSE REDUCTION: This exam was performed according to the departmental dose-optimization program which includes automated exposure control, adjustment of the mA and/or kV according to patient size and/or use of iterative reconstruction technique. CONTRAST:  132mL OMNIPAQUE IOHEXOL 300 MG/ML  SOLN COMPARISON:  MRI breast 08/21/2014 FINDINGS: Lower chest: Coronary artery calcifications. No acute abnormality. Slightly patulous distal esophagus versus tiny hiatal hernia. Hepatobiliary: The liver is enlarged measuring up to 24 cm. The hepatic parenchyma is slightly diffusely hypodense compared to the splenic parenchyma consistent with fatty infiltration. No focal liver abnormality. Multiple calcified gallstone within the gallbladder lumen. No gallbladder wall thickening or pericholecystic fluid. No biliary dilatation. No CT findings suggest choledocholithiasis. Pancreas: Diffusely atrophic. No focal lesion. Diffuse peripancreatic fat stranding and trace free fluid. No main pancreatic ductal dilatation. No pseudocyst formation. Spleen: Normal in size without focal abnormality. Adrenals/Urinary Tract: No adrenal nodule bilaterally. Bilateral kidneys enhance symmetrically. No  hydronephrosis. No hydroureter. The urinary bladder is unremarkable. On delayed imaging, there is no urothelial wall thickening and there are no filling defects in the opacified portions of the bilateral collecting systems or ureters. Stomach/Bowel: Stomach is within normal limits. No evidence of bowel wall thickening or dilatation. Scattered colonic diverticulosis. Appendix appears normal. Vascular/Lymphatic: No abdominal aorta or iliac aneurysm. Moderate to severe atherosclerotic plaque of the aorta and its branches. No abdominal, pelvic, or inguinal lymphadenopathy. Reproductive: Uterus and bilateral adnexa are unremarkable. Other: No intraperitoneal free fluid. No intraperitoneal free gas. No organized fluid collection. Musculoskeletal: No abdominal wall hernia or abnormality. There is a right breast 4.9 x 2.9 cm fluid dense lesion with associated surgical clips. No suspicious lytic or blastic osseous lesions. No acute displaced fracture. Multilevel degenerative changes of the spine with intervertebral disc space vacuum phenomenon at the L1 through L5 levels. IMPRESSION: 1. Acute pancreatitis.  No pseudocyst formation. 2. Cholelithiasis. 3. Hepatomegaly and hepatic steatosis. 4.  Aortic Atherosclerosis (ICD10-I70.0). 5. A right breast 4.9 x 2.9 cm fluid dense lesion with associated surgical clips. Correlate with mammography. Electronically Signed   By: Iven Finn M.D.   On: 11/26/2021 00:38   US Abdomen Limited RUQ (LIVER/GB)  Result Date: 11/26/2021 CLINICAL DATA:  Right upper quadrant pain, vomiting EXAM: ULTRASOUND ABDOMEN LIMITED RIGHT UPPER QUADRANT COMPARISON:  None. FINDINGS: Gallbladder: Gallstones within the gallbladder measuring up to 1.3 cm. No wall thickening. Common bile duct: Diameter: Normal caliber, 4-5 mm Liver: Mildly increased echotexture throughout the liver suggesting fatty infiltration. No focal hepatic abnormality. Portal vein is patent on color Doppler imaging with normal  direction of blood flow towards the liver. Other: None. IMPRESSION: Cholelithiasis. Hepatic steatosis. Electronically Signed   By: Rolm Baptise M.D.   On: 11/26/2021 01:13      Assessment/Plan Acute pancreatitis, likely gallstone etiology - CT with acute pancreatitis and cholelithiasis. [place] with cholelithiasis and CBD 4-5 mm - elevated LFTs (AST/ALT improving), t bili 1.3 > 3.7 >  3.3 - from history sounds likely patient has had symptomatic cholelithiasis over the past couple weeks prior to this episode. I think she would benefit from laparoscopic cholecystectomy this admission if possible to prevent recurrence. Pain is improving and lipase downtrending. She does have elevated LFTs/T bili - will continue to monitor and if they do not improve she may need GI consult for possible ERCP. Recheck labs in am including lipase and if stable to improved then plan for laparoscopic cholecystectomy with IOC tomorrow. I have explained the procedure, risks, and aftercare of cholecystectomy.  Risks include but are not limited to bleeding, infection, wound problems, anesthesia, diarrhea, bile leak, injury to common bile duct/liver/intestine.  She seems to understand and agrees to proceed.  - she has been tolerating clears. Can continue clears today from surgical standpoint  FEN: CLD, NPO MN ID: none currently, rocephin periop VTE: okay for chemical prophylaxis from surgical standpoint  HLD HTN Hypokalemia - repletion ordered H/o breast cancer - s/p lumpectomy and xrt. Lesion on CT this admission. F/u with Dr. Lindi Adie outpatient  I reviewed ED provider notes, hospitalist notes, last 24 h vitals and pain scores, last 48 h intake and output, last 24 h labs and trends, and last 24 h imaging results.  This care required high  level of medical decision making.   Winferd Humphrey, [facility] 11/27/2021, 7:21 AM Please see Amion for pager number during day hours 7:00am-4:30pm

## 2021-11-27 NOTE — Consult Note (Signed)
Consult Note  Miranda Mueller 06/02/1941  [id number].    Requesting MD: Dr. Erlinda Hong Chief Complaint/Reason for Consult: gallstone pancreatitis  HPI:  81 year old female with medical history significant for HTN, HLD, GERD, IBS, breast cancer s/p lumpectomy and XRT in remission who presented to [facility] ED due to acute severe abdominal pain, nausea, and emesis beginning the day of presentation. She notes having increasing abdominal pain in epigastrium and RUQ and nausea/emesis after eating over the last several weeks but had attributed her symptoms to IBS and decided to come to the ED due to increased severity of abdominal pain. She has had some associated chills. ROS otherwise as below.  Work up in ED significant for: WBC 17.7 (now 10.7), elevated LFTs including t bili 1.3 (now 3.3), lipase 1238 (now 496). US showing cholelithiasis, CT scan showing acute pancreatitis, hepatomegaly and hepatic steatosis. She was transferred to [facility] and admitted to [facility] service for further evaluation and treatment. General surgery asked to see in regard to gallstone pancreatitis.  Yesterday her pain improved and she drank clear liquids without abdominal pain. No further nausea or emesis. She has not needed pain medications this morning so far.  Substance use: none Allergies: none Blood thinners: none Past Surgeries: no prior abdominal surgeries   ROS: Review of Systems  Constitutional:  Positive for chills. Negative for fever.  Respiratory:  Negative for cough, shortness of breath and wheezing.   Cardiovascular:  Negative for chest pain, palpitations and leg swelling.  Gastrointestinal:  Positive for abdominal pain, nausea and vomiting. Negative for blood in stool, constipation, diarrhea and melena.  Genitourinary: Negative.    Family History  Problem Relation Age of Onset   Hypertension Mother    Heart disease Mother        CAD   Breast cancer Mother 51   Hypertension Sister    Breast  cancer Sister 15   Aneurysm Sister    Hypertension Brother    Prostate cancer Brother 65   Leukemia Maternal Uncle 34   Heart disease Maternal Grandmother    Leukemia Maternal Uncle 66   Breast cancer Cousin        dx under 77   Colon cancer Cousin        dx in her 21s   Colon cancer Cousin    Lymphoma Other 77       NHL   Colon polyps Daughter    Stroke Father    Esophageal cancer Neg Hx    Stomach cancer Neg Hx    Rectal cancer Neg Hx     Past Medical History:  Diagnosis Date   Bradycardia    Breast cancer ([place]) 08/29/14   Right Breast -High Grade Invasive Mammary   Cataract    surgery   GERD (gastroesophageal reflux disease)    History of shingles    Hx of radiation therapy 10/01/14- 11/20/14   right breast/50.4 Gy/28 fx; right breast boost/10 Gy/5 fx   Hyperlipidemia    Hypertension    IBS (irritable bowel syndrome)    Multifocal PVCs    PVC (premature ventricular contraction)    Hx of   Sleep apnea    no cpap per pt     Past Surgical History:  Procedure Laterality Date   BREAST LUMPECTOMY Right 08/29/14   started radiation 10/01/14   CATARACT EXTRACTION  2009   bilateral   COLONOSCOPY  2004-last 2016   normal   KNEE ARTHROSCOPY  right   NEEDLE CORE BIOPSY  RIGHT BREAST Right 08/14/14   OTHER SURGICAL HISTORY Left 1997   breast,cyst   PYLOROPLASTY  2016   RADIOACTIVE SEED GUIDED PARTIAL MASTECTOMY WITH AXILLARY SENTINEL LYMPH NODE BIOPSY Right 08/29/2014   Procedure: SEED LOCALIZED RIGHT BREAST LUMPECTOMY AND RIGHT AXILLARY SENTTINEL LYMPH NODE BIOPSY;  Surgeon: Excell Seltzer, MD;  Location: [place];  Service: General;  Laterality: Right;   WISDOM TOOTH EXTRACTION      Social History:  reports that she has never smoked. She has never used smokeless tobacco. She reports that she does not drink alcohol and does not use drugs.  Allergies: No Known Allergies  Medications Prior to Admission  Medication Sig Dispense Refill    acetaminophen (TYLENOL) 500 MG tablet Take 500 mg by mouth every 6 (six) hours as needed.     aspirin 81 MG tablet Take 81 mg by mouth daily.     atorvastatin (LIPITOR) 10 MG tablet TAKE 1 TABLET DAILY 90 tablet 3   cycloSPORINE (RESTASIS) 0.05 % ophthalmic emulsion Place 1 drop into both eyes 2 (two) times daily.       esomeprazole (NEXIUM) 40 MG capsule TAKE 1 CAPSULE DAILY AT 12 NOON 90 capsule 2   fexofenadine (ALLEGRA) 180 MG tablet Take 180 mg by mouth daily.     hydrochlorothiazide (HYDRODIURIL) 25 MG tablet TAKE 1 TABLET DAILY 90 tablet 2   Multiple Vitamins-Minerals (HAIR/SKIN/NAILS PO) Take 1 tablet by mouth daily.     olmesartan (BENICAR) 20 MG tablet TAKE 1 TABLET DAILY 90 tablet 1   Potassium 99 MG TABS Take 1 tablet by mouth daily.       Zinc 50 MG CAPS Take by mouth.     betamethasone dipropionate 0.05 % cream APPLY TOPICALLY AS NEEDED 30 g 11   fluticasone (FLONASE) 50 MCG/ACT nasal spray TAKE 2 SPRAYS INTO BOTH NOSTRILS DAILY 16 g 4   methocarbamol (ROBAXIN) 500 MG tablet TAKE 1 TABLET DAILY AS NEEDED FOR MUSCLE SPASMS 30 tablet 11    Blood pressure 136/63, pulse 85, temperature 99.4 F (37.4 C), temperature source Oral, resp. rate 16, height 5\' 6"  (1.676 m), weight 118.8 kg, SpO2 96 %. Physical Exam: General: pleasant, WD, female who is laying in bed in NAD HEENT: head is normocephalic, atraumatic.  Sclera are noninjected.  Pupils equal and round. EOMs intact.  Ears and nose without any masses or lesions.  Mouth is pink and moist Heart: regular, rate, and rhythm.  Normal s1,s2. No obvious murmurs, gallops, or rubs noted.  Palpable radial and pedal pulses bilaterally Lungs: CTAB, no wheezes, rhonchi, or rales noted.  Respiratory effort nonlabored Abd: soft, ND, +BS, no masses, hernias, or organomegaly. Very mild TTP in epigastrium, RUQ, RLQ without rebound or guarding MSK: all 4 extremities are symmetrical with no cyanosis, clubbing, or edema. Skin: warm and dry with no  masses, lesions, or rashes Neuro: Cranial nerves 2-12 grossly intact, sensation is normal throughout Psych: A&Ox3 with an appropriate affect.    Results for orders placed or performed during the hospital encounter of 11/25/21 (from the past 48 hour(s))  Lipase, blood     Status: Abnormal   Collection Time: 11/25/21 10:09 PM  Result Value Ref Range   Lipase 1,238 (H) 11 - 51 U/L    Comment: RESULTS CONFIRMED BY MANUAL DILUTION REPEATED TO VERIFY Performed at [facility], [address] [place], [place] [address]   Comprehensive metabolic panel     Status:  Abnormal   Collection Time: 11/25/21 10:09 PM  Result Value Ref Range   Sodium 135 135 - 145 mmol/L   Potassium 2.9 (L) 3.5 - 5.1 mmol/L   Chloride 99 98 - 111 mmol/L   CO2 21 (L) 22 - 32 mmol/L   Glucose, Bld 138 (H) 70 - 99 mg/dL    Comment: Glucose reference range applies only to samples taken after fasting for at least 8 hours.   BUN 17 8 - 23 mg/dL   Creatinine, Ser 0.85 0.44 - 1.00 mg/dL   Calcium 9.3 8.9 - 10.3 mg/dL   Total Protein 7.4 6.5 - 8.1 g/dL   Albumin 4.0 3.5 - 5.0 g/dL   AST 143 (H) 15 - 41 U/L   ALT 122 (H) 0 - 44 U/L   Alkaline Phosphatase 64 38 - 126 U/L   Total Bilirubin 1.3 (H) 0.3 - 1.2 mg/dL   GFR, Estimated >60 >60 mL/min    Comment: (NOTE) Calculated using the CKD-EPI Creatinine Equation (2021)    Anion gap 15 5 - 15    Comment: Performed at [facility], [address], [place], [place] [address]  CBC     Status: Abnormal   Collection Time: 11/25/21 10:09 PM  Result Value Ref Range   WBC 17.7 (H) 4.0 - 10.5 K/uL   RBC 4.59 3.87 - 5.11 MIL/uL   Hemoglobin 13.4 12.0 - 15.0 g/dL   HCT 40.3 36.0 - 46.0 %   MCV 87.8 80.0 - 100.0 fL   MCH 29.2 26.0 - 34.0 pg   MCHC 33.3 30.0 - 36.0 g/dL   RDW 13.2 11.5 - 15.5 %   Platelets 421 (H) 150 - 400 K/uL   nRBC 0.0 0.0 - 0.2 %    Comment: Performed at [facility], [place], [place] [address]  Troponin I (High Sensitivity)     Status: None   Collection Time: 11/25/21 10:09 PM  Result Value Ref Range   Troponin I (High Sensitivity) 2 <18 ng/L    Comment: (NOTE) Elevated high sensitivity troponin I (hsTnI) values and significant  changes across serial measurements may suggest ACS but many other  chronic and acute conditions are known to elevate hsTnI results.  Refer to the "Links" section for chest pain algorithms and additional  guidance. Performed at [facility], [address] [place], [place] [address]   Troponin I (High Sensitivity)     Status: None   Collection Time: 11/26/21 12:13 AM  Result Value Ref Range   Troponin I (High Sensitivity) 4 <18 ng/L    Comment: (NOTE) Elevated high sensitivity troponin I (hsTnI) values and significant  changes across serial measurements may suggest ACS but many other  chronic and acute conditions are known to elevate hsTnI results.  Refer to the "Links" section for chest pain algorithms and additional  guidance. Performed at [facility], [address] [place], [place] [address]   Urinalysis, Routine w reflex microscopic Urine, Clean Catch     Status: Abnormal   Collection Time: 11/26/21  1:20 AM  Result Value Ref Range   Color, Urine YELLOW YELLOW   APPearance CLEAR CLEAR   Specific Gravity, Urine 1.030 1.005 - 1.030   pH 7.0 5.0 - 8.0   Glucose, UA NEGATIVE NEGATIVE mg/dL   Hgb urine dipstick NEGATIVE NEGATIVE   Bilirubin Urine NEGATIVE NEGATIVE   Ketones, ur NEGATIVE NEGATIVE mg/dL   Protein, ur TRACE (A) NEGATIVE mg/dL  Nitrite NEGATIVE NEGATIVE   Leukocytes,Ua NEGATIVE NEGATIVE    Comment: Performed at [facility], [address], [place], [place] [address]  Resp Panel by RT-PCR (Flu A&B, Covid) Nasopharyngeal Swab     Status: None   Collection Time: 11/26/21  1:22 AM   Specimen: Nasopharyngeal Swab; Nasopharyngeal(NP) swabs in vial  transport medium  Result Value Ref Range   SARS Coronavirus 2 by RT PCR NEGATIVE NEGATIVE    Comment: (NOTE) SARS-CoV-2 target nucleic acids are NOT DETECTED.  The SARS-CoV-2 RNA is generally detectable in upper respiratory specimens during the acute phase of infection. The lowest concentration of SARS-CoV-2 viral copies this assay can detect is 138 copies/mL. A negative result does not preclude SARS-Cov-2 infection and should not be used as the sole basis for treatment or other patient management decisions. A negative result may occur with  improper specimen collection/handling, submission of specimen other than nasopharyngeal swab, presence of viral mutation(s) within the areas targeted by this assay, and inadequate number of viral copies(<138 copies/mL). A negative result must be combined with clinical observations, patient history, and epidemiological information. The expected result is Negative.  Fact Sheet for Patients:  EntrepreneurPulse.com.au  Fact Sheet for Healthcare Providers:  IncredibleEmployment.be  This test is no t yet approved or cleared by the [place] FDA and  has been authorized for detection and/or diagnosis of SARS-CoV-2 by FDA under an Emergency Use Authorization (EUA). This EUA will remain  in effect (meaning this test can be used) for the duration of the COVID-19 declaration under Section 564(b)(1) of the Act, 21 U.S.C.section 360bbb-3(b)(1), unless the authorization is terminated  or revoked sooner.       Influenza A by PCR NEGATIVE NEGATIVE   Influenza B by PCR NEGATIVE NEGATIVE    Comment: (NOTE) The Xpert Xpress SARS-CoV-2/FLU/RSV plus assay is intended as an aid in the diagnosis of influenza from Nasopharyngeal swab specimens and should not be used as a sole basis for treatment. Nasal washings and aspirates are unacceptable for Xpert Xpress SARS-CoV-2/FLU/RSV testing.  Fact Sheet for  Patients: EntrepreneurPulse.com.au  Fact Sheet for Healthcare Providers: IncredibleEmployment.be  This test is not yet approved or cleared by the [place] FDA and has been authorized for detection and/or diagnosis of SARS-CoV-2 by FDA under an Emergency Use Authorization (EUA). This EUA will remain in effect (meaning this test can be used) for the duration of the COVID-19 declaration under Section 564(b)(1) of the Act, 21 U.S.C. section 360bbb-3(b)(1), unless the authorization is terminated or revoked.  Performed at [facility], [address] [place],  [address]   Comprehensive metabolic panel     Status: Abnormal   Collection Time: 11/26/21  5:36 PM  Result Value Ref Range   Sodium 136 135 - 145 mmol/L   Potassium 3.2 (L) 3.5 - 5.1 mmol/L   Chloride 103 98 - 111 mmol/L   CO2 25 22 - 32 mmol/L   Glucose, Bld 100 (H) 70 - 99 mg/dL    Comment: Glucose reference range applies only to samples taken after fasting for at least 8 hours.   BUN 16 8 - 23 mg/dL   Creatinine, Ser 0.77 0.44 - 1.00 mg/dL   Calcium 8.3 (L) 8.9 - 10.3 mg/dL   Total Protein 7.1 6.5 - 8.1 g/dL   Albumin 3.3 (L) 3.5 - 5.0 g/dL   AST 91 (H) 15 - 41 U/L   ALT 134 (H) 0 - 44 U/L   Alkaline Phosphatase 69 38 -  126 U/L   Total Bilirubin 3.7 (H) 0.3 - 1.2 mg/dL   GFR, Estimated >60 >60 mL/min    Comment: (NOTE) Calculated using the CKD-EPI Creatinine Equation (2021)    Anion gap 8 5 - 15    Comment: Performed at [facility], [place] [address] [place], [place] [address]  Lipase, blood     Status: Abnormal   Collection Time: 11/26/21  5:36 PM  Result Value Ref Range   Lipase 1,069 (H) 11 - 51 U/L    Comment: RESULTS CONFIRMED BY MANUAL DILUTION Performed at [facility], [place] [address] [place], [place] [address]   Magnesium     Status: None   Collection Time: 11/26/21  5:36 PM  Result Value Ref Range    Magnesium 1.8 1.7 - 2.4 mg/dL    Comment: Performed at [facility], [place] [address] [place], [place] [address]  Magnesium     Status: None   Collection Time: 11/27/21  4:46 AM  Result Value Ref Range   Magnesium 1.8 1.7 - 2.4 mg/dL    Comment: Performed at [facility], [place] [address] [place], [place] [address]  CBC     Status: Abnormal   Collection Time: 11/27/21  4:46 AM  Result Value Ref Range   WBC 10.7 (H) 4.0 - 10.5 K/uL   RBC 3.66 (L) 3.87 - 5.11 MIL/uL   Hemoglobin 10.9 (L) 12.0 - 15.0 g/dL   HCT 32.1 (L) 36.0 - 46.0 %   MCV 87.7 80.0 - 100.0 fL   MCH 29.8 26.0 - 34.0 pg   MCHC 34.0 30.0 - 36.0 g/dL   RDW 13.5 11.5 - 15.5 %   Platelets 276 150 - 400 K/uL   nRBC 0.0 0.0 - 0.2 %    Comment: Performed at [facility], [place] [address], [place], [place] [address]  Comprehensive metabolic panel     Status: Abnormal   Collection Time: 11/27/21  4:46 AM  Result Value Ref Range   Sodium 137 135 - 145 mmol/L   Potassium 2.8 (L) 3.5 - 5.1 mmol/L   Chloride 107 98 - 111 mmol/L   CO2 24 22 - 32 mmol/L   Glucose, Bld 95 70 - 99 mg/dL    Comment: Glucose reference range applies only to samples taken after fasting for at least 8 hours.   BUN 13 8 - 23 mg/dL   Creatinine, Ser 0.70 0.44 - 1.00 mg/dL   Calcium 7.9 (L) 8.9 - 10.3 mg/dL   Total Protein 6.1 (L) 6.5 - 8.1 g/dL   Albumin 3.1 (L) 3.5 - 5.0 g/dL   AST 58 (H) 15 - 41 U/L   ALT 97 (H) 0 - 44 U/L   Alkaline Phosphatase 67 38 - 126 U/L   Total Bilirubin 3.3 (H) 0.3 - 1.2 mg/dL   GFR, Estimated >60 >60 mL/min    Comment: (NOTE) Calculated using the CKD-EPI Creatinine Equation (2021)    Anion gap 6 5 - 15    Comment: Performed at [facility], [place] [address] [place], [place] [address]  Protime-INR     Status: None   Collection Time: 11/27/21  4:46 AM  Result Value Ref Range   Prothrombin Time 15.0 11.4 - 15.2 seconds   INR 1.2 0.8 - 1.2     Comment: (NOTE) INR goal varies based on device and disease states. Performed at [facility], [place] [address] [place],  [place] [address]   Lipase, blood     Status: Abnormal   Collection Time: 11/27/21  4:46 AM  Result Value Ref Range   Lipase 496 (H) 11 - 51 U/L    Comment: RESULTS CONFIRMED BY MANUAL DILUTION Performed at [facility], [place] [address] [place], [place] [address]    CT ABDOMEN PELVIS W CONTRAST  Result Date: 11/26/2021 CLINICAL DATA:  Pancreatitis, acute, severe EXAM: CT ABDOMEN AND PELVIS WITH CONTRAST TECHNIQUE: Multidetector CT imaging of the abdomen and pelvis was performed using the standard protocol following bolus administration of intravenous contrast. RADIATION DOSE REDUCTION: This exam was performed according to the departmental dose-optimization program which includes automated exposure control, adjustment of the mA and/or kV according to patient size and/or use of iterative reconstruction technique. CONTRAST:  167mL OMNIPAQUE IOHEXOL 300 MG/ML  SOLN COMPARISON:  MRI breast 08/21/2014 FINDINGS: Lower chest: Coronary artery calcifications. No acute abnormality. Slightly patulous distal esophagus versus tiny hiatal hernia. Hepatobiliary: The liver is enlarged measuring up to 24 cm. The hepatic parenchyma is slightly diffusely hypodense compared to the splenic parenchyma consistent with fatty infiltration. No focal liver abnormality. Multiple calcified gallstone within the gallbladder lumen. No gallbladder wall thickening or pericholecystic fluid. No biliary dilatation. No CT findings suggest choledocholithiasis. Pancreas: Diffusely atrophic. No focal lesion. Diffuse peripancreatic fat stranding and trace free fluid. No main pancreatic ductal dilatation. No pseudocyst formation. Spleen: Normal in size without focal abnormality. Adrenals/Urinary Tract: No adrenal nodule bilaterally. Bilateral kidneys enhance symmetrically. No  hydronephrosis. No hydroureter. The urinary bladder is unremarkable. On delayed imaging, there is no urothelial wall thickening and there are no filling defects in the opacified portions of the bilateral collecting systems or ureters. Stomach/Bowel: Stomach is within normal limits. No evidence of bowel wall thickening or dilatation. Scattered colonic diverticulosis. Appendix appears normal. Vascular/Lymphatic: No abdominal aorta or iliac aneurysm. Moderate to severe atherosclerotic plaque of the aorta and its branches. No abdominal, pelvic, or inguinal lymphadenopathy. Reproductive: Uterus and bilateral adnexa are unremarkable. Other: No intraperitoneal free fluid. No intraperitoneal free gas. No organized fluid collection. Musculoskeletal: No abdominal wall hernia or abnormality. There is a right breast 4.9 x 2.9 cm fluid dense lesion with associated surgical clips. No suspicious lytic or blastic osseous lesions. No acute displaced fracture. Multilevel degenerative changes of the spine with intervertebral disc space vacuum phenomenon at the L1 through L5 levels. IMPRESSION: 1. Acute pancreatitis.  No pseudocyst formation. 2. Cholelithiasis. 3. Hepatomegaly and hepatic steatosis. 4.  Aortic Atherosclerosis (ICD10-I70.0). 5. A right breast 4.9 x 2.9 cm fluid dense lesion with associated surgical clips. Correlate with mammography. Electronically Signed   By: Iven Finn M.D.   On: 11/26/2021 00:38   US Abdomen Limited RUQ (LIVER/GB)  Result Date: 11/26/2021 CLINICAL DATA:  Right upper quadrant pain, vomiting EXAM: ULTRASOUND ABDOMEN LIMITED RIGHT UPPER QUADRANT COMPARISON:  None. FINDINGS: Gallbladder: Gallstones within the gallbladder measuring up to 1.3 cm. No wall thickening. Common bile duct: Diameter: Normal caliber, 4-5 mm Liver: Mildly increased echotexture throughout the liver suggesting fatty infiltration. No focal hepatic abnormality. Portal vein is patent on color Doppler imaging with normal  direction of blood flow towards the liver. Other: None. IMPRESSION: Cholelithiasis. Hepatic steatosis. Electronically Signed   By: Rolm Baptise M.D.   On: 11/26/2021 01:13      Assessment/Plan Acute pancreatitis, likely gallstone etiology - CT with acute pancreatitis and cholelithiasis. [place] with cholelithiasis and CBD 4-5 mm - elevated LFTs (AST/ALT improving), t bili 1.3 > 3.7 >  3.3 - from history sounds likely patient has had symptomatic cholelithiasis over the past couple weeks prior to this episode. I think she would benefit from laparoscopic cholecystectomy this admission if possible to prevent recurrence. Pain is improving and lipase downtrending. She does have elevated LFTs/T bili - will continue to monitor and if they do not improve she may need GI consult for possible ERCP. Recheck labs in am including lipase and if stable to improved then plan for laparoscopic cholecystectomy with IOC tomorrow. I have explained the procedure, risks, and aftercare of cholecystectomy.  Risks include but are not limited to bleeding, infection, wound problems, anesthesia, diarrhea, bile leak, injury to common bile duct/liver/intestine.  She seems to understand and agrees to proceed.  - she has been tolerating clears. Can continue clears today from surgical standpoint  FEN: CLD, NPO MN ID: none currently, rocephin periop VTE: okay for chemical prophylaxis from surgical standpoint  HLD HTN Hypokalemia - repletion ordered H/o breast cancer - s/p lumpectomy and xrt. Lesion on CT this admission. F/u with Dr. Lindi Adie outpatient  I reviewed ED provider notes, hospitalist notes, last 24 h vitals and pain scores, last 48 h intake and output, last 24 h labs and trends, and last 24 h imaging results.  This care required high  level of medical decision making.   Winferd Humphrey, [facility] 11/27/2021, 7:21 AM Please see Amion for pager number during day hours 7:00am-4:30pm

## 2021-11-28 ENCOUNTER — Other Ambulatory Visit: Payer: Self-pay

## 2021-11-28 ENCOUNTER — Encounter (HOSPITAL_COMMUNITY)
Admission: EM | Disposition: A | Discharge status: Home / Self Care | Payer: Self-pay | Source: Home / Self Care | Attending: Internal Medicine

## 2021-11-28 ENCOUNTER — Inpatient Hospital Stay (HOSPITAL_COMMUNITY): Payer: Medicare Other | Admitting: Certified Registered Nurse Anesthetist

## 2021-11-28 ENCOUNTER — Encounter (HOSPITAL_COMMUNITY): Payer: Self-pay | Admitting: Internal Medicine

## 2021-11-28 ENCOUNTER — Inpatient Hospital Stay (HOSPITAL_COMMUNITY): Payer: Medicare Other

## 2021-11-28 DIAGNOSIS — Z853 Personal history of malignant neoplasm of breast: Secondary | ICD-10-CM | POA: Diagnosis not present

## 2021-11-28 DIAGNOSIS — E876 Hypokalemia: Secondary | ICD-10-CM | POA: Diagnosis not present

## 2021-11-28 DIAGNOSIS — K219 Gastro-esophageal reflux disease without esophagitis: Secondary | ICD-10-CM

## 2021-11-28 DIAGNOSIS — K851 Biliary acute pancreatitis without necrosis or infection: Secondary | ICD-10-CM | POA: Diagnosis not present

## 2021-11-28 DIAGNOSIS — I10 Essential (primary) hypertension: Secondary | ICD-10-CM

## 2021-11-28 DIAGNOSIS — N289 Disorder of kidney and ureter, unspecified: Secondary | ICD-10-CM

## 2021-11-28 HISTORY — PX: CHOLECYSTECTOMY: SHX55

## 2021-11-28 LAB — COMPREHENSIVE METABOLIC PANEL
ALT: 76 U/L — ABNORMAL HIGH (ref 0–44)
AST: 43 U/L — ABNORMAL HIGH (ref 15–41)
Albumin: 3.2 g/dL — ABNORMAL LOW (ref 3.5–5.0)
Alkaline Phosphatase: 77 U/L (ref 38–126)
Anion gap: 8 (ref 5–15)
BUN: 10 mg/dL (ref 8–23)
CO2: 20 mmol/L — ABNORMAL LOW (ref 22–32)
Calcium: 8.3 mg/dL — ABNORMAL LOW (ref 8.9–10.3)
Chloride: 107 mmol/L (ref 98–111)
Creatinine, Ser: 0.77 mg/dL (ref 0.44–1.00)
GFR, Estimated: >60 mL/min (ref >60)
Glucose, Bld: 133 mg/dL — ABNORMAL HIGH (ref 70–99)
Potassium: 3.3 mmol/L — ABNORMAL LOW (ref 3.5–5.1)
Sodium: 135 mmol/L (ref 135–145)
Total Bilirubin: 2.3 mg/dL — ABNORMAL HIGH (ref 0.3–1.2)
Total Protein: 6.6 g/dL (ref 6.5–8.1)

## 2021-11-28 LAB — CBC
HCT: 33.9 % — ABNORMAL LOW (ref 36.0–46.0)
Hemoglobin: 11.1 g/dL — ABNORMAL LOW (ref 12.0–15.0)
MCH: 29 pg (ref 26.0–34.0)
MCHC: 32.7 g/dL (ref 30.0–36.0)
MCV: 88.5 fL (ref 80.0–100.0)
Platelets: 273 10*3/uL (ref 150–400)
RBC: 3.83 MIL/uL — ABNORMAL LOW (ref 3.87–5.11)
RDW: 13.4 % (ref 11.5–15.5)
WBC: 7.2 10*3/uL (ref 4.0–10.5)
nRBC: 0 % (ref 0.0–0.2)

## 2021-11-28 LAB — MAGNESIUM: Magnesium: 2.1 mg/dL (ref 1.7–2.4)

## 2021-11-28 LAB — LIPASE, BLOOD: Lipase: 183 U/L — ABNORMAL HIGH (ref 11–51)

## 2021-11-28 SURGERY — LAPAROSCOPIC CHOLECYSTECTOMY WITH INTRAOPERATIVE CHOLANGIOGRAM
Anesthesia: General

## 2021-11-28 MED ORDER — FENTANYL CITRATE PF 50 MCG/ML IJ SOSY
PREFILLED_SYRINGE | INTRAMUSCULAR | Status: AC
  Filled 2021-11-28: qty 2

## 2021-11-28 MED ORDER — LIP MEDEX EX OINT
1.0000 "application " | TOPICAL_OINTMENT | Freq: Two times a day (BID) | CUTANEOUS | Status: DC
  Administered 2021-11-28 – 2021-11-29 (×3): 1 via TOPICAL
  Filled 2021-11-28: qty 7

## 2021-11-28 MED ORDER — ACETAMINOPHEN 500 MG PO TABS
500.0000 mg | ORAL_TABLET | Freq: Three times a day (TID) | ORAL | Status: DC
  Administered 2021-11-28 – 2021-11-29 (×4): 500 mg via ORAL
  Filled 2021-11-28 (×4): qty 1

## 2021-11-28 MED ORDER — ROCURONIUM BROMIDE 10 MG/ML (PF) SYRINGE
PREFILLED_SYRINGE | INTRAVENOUS | Status: AC
  Filled 2021-11-28: qty 10

## 2021-11-28 MED ORDER — PHENYLEPHRINE HCL (PRESSORS) 10 MG/ML IV SOLN
INTRAVENOUS | Status: AC
  Filled 2021-11-28: qty 1

## 2021-11-28 MED ORDER — FENTANYL CITRATE PF 50 MCG/ML IJ SOSY
25.0000 ug | PREFILLED_SYRINGE | INTRAMUSCULAR | Status: DC | PRN
  Administered 2021-11-28 (×2): 25 ug via INTRAVENOUS

## 2021-11-28 MED ORDER — PHENYLEPHRINE HCL-NACL 20-0.9 MG/250ML-% IV SOLN
INTRAVENOUS | Status: DC | PRN
  Administered 2021-11-28: 40 ug/min via INTRAVENOUS

## 2021-11-28 MED ORDER — LACTATED RINGERS IR SOLN
Status: DC | PRN
  Administered 2021-11-28: 1000 mL

## 2021-11-28 MED ORDER — BISACODYL 10 MG RE SUPP: 10.0000 mg | Freq: Two times a day (BID) | RECTAL | Status: DC | PRN

## 2021-11-28 MED ORDER — FENTANYL CITRATE (PF) 100 MCG/2ML IJ SOLN
INTRAMUSCULAR | Status: DC | PRN
  Administered 2021-11-28: 50 ug via INTRAVENOUS
  Administered 2021-11-28: 100 ug via INTRAVENOUS
  Administered 2021-11-28: 50 ug via INTRAVENOUS

## 2021-11-28 MED ORDER — SODIUM CHLORIDE 0.9% FLUSH: 3.0000 mL | INTRAVENOUS | Status: DC | PRN

## 2021-11-28 MED ORDER — OXYCODONE HCL 5 MG/5ML PO SOLN: 5.0000 mg | Freq: Once | ORAL | Status: DC | PRN

## 2021-11-28 MED ORDER — FENTANYL CITRATE (PF) 100 MCG/2ML IJ SOLN
INTRAMUSCULAR | Status: AC
  Filled 2021-11-28: qty 2

## 2021-11-28 MED ORDER — GABAPENTIN 100 MG PO CAPS
200.0000 mg | ORAL_CAPSULE | Freq: Three times a day (TID) | ORAL | Status: DC
  Administered 2021-11-28 – 2021-11-29 (×3): 200 mg via ORAL
  Filled 2021-11-28 (×3): qty 2

## 2021-11-28 MED ORDER — CALCIUM POLYCARBOPHIL 625 MG PO TABS
625.0000 mg | ORAL_TABLET | Freq: Two times a day (BID) | ORAL | Status: DC
Start: 2021-11-28 — End: 2021-11-29
  Administered 2021-11-28 – 2021-11-29 (×3): 625 mg via ORAL
  Filled 2021-11-28 (×3): qty 1

## 2021-11-28 MED ORDER — TRAMADOL HCL 50 MG PO TABS
50.0000 mg | ORAL_TABLET | Freq: Four times a day (QID) | ORAL | Status: DC | PRN
  Administered 2021-11-28 – 2021-11-29 (×2): 50 mg via ORAL
  Filled 2021-11-28 (×2): qty 1

## 2021-11-28 MED ORDER — SUGAMMADEX SODIUM 200 MG/2ML IV SOLN
INTRAVENOUS | Status: DC | PRN
Start: 2021-11-28 — End: 2021-11-28
  Administered 2021-11-28: 200 mg via INTRAVENOUS

## 2021-11-28 MED ORDER — POTASSIUM CHLORIDE 20 MEQ PO PACK
40.0000 meq | PACK | Freq: Once | ORAL | Status: AC
  Administered 2021-11-28: 40 meq via ORAL
  Filled 2021-11-28: qty 2

## 2021-11-28 MED ORDER — ACETAMINOPHEN 500 MG PO TABS: 1000.0000 mg | ORAL_TABLET | Freq: Once | ORAL | Status: DC | PRN

## 2021-11-28 MED ORDER — PROCHLORPERAZINE EDISYLATE 10 MG/2ML IJ SOLN: 5.0000 mg | INTRAMUSCULAR | Status: DC | PRN

## 2021-11-28 MED ORDER — LACTATED RINGERS IV SOLN: INTRAVENOUS | Status: DC

## 2021-11-28 MED ORDER — DEXAMETHASONE SODIUM PHOSPHATE 10 MG/ML IJ SOLN
INTRAMUSCULAR | Status: DC | PRN
  Administered 2021-11-28: 5 mg via INTRAVENOUS

## 2021-11-28 MED ORDER — SODIUM CHLORIDE 0.9% FLUSH
3.0000 mL | Freq: Two times a day (BID) | INTRAVENOUS | Status: DC
  Administered 2021-11-28 – 2021-11-29 (×3): 3 mL via INTRAVENOUS

## 2021-11-28 MED ORDER — PHENYLEPHRINE 40 MCG/ML (10ML) SYRINGE FOR IV PUSH (FOR BLOOD PRESSURE SUPPORT)
PREFILLED_SYRINGE | INTRAVENOUS | Status: AC
  Filled 2021-11-28: qty 20

## 2021-11-28 MED ORDER — PHENAZOPYRIDINE HCL 200 MG PO TABS
ORAL_TABLET | ORAL | Status: AC
  Filled 2021-11-28: qty 1

## 2021-11-28 MED ORDER — PHENOL 1.4 % MT LIQD
2.0000 | OROMUCOSAL | Status: DC | PRN
Start: 2021-11-28 — End: 2021-11-29
  Filled 2021-11-28: qty 177

## 2021-11-28 MED ORDER — LIDOCAINE HCL (CARDIAC) PF 100 MG/5ML IV SOSY
PREFILLED_SYRINGE | INTRAVENOUS | Status: DC | PRN
  Administered 2021-11-28: 100 mg via INTRAVENOUS

## 2021-11-28 MED ORDER — SODIUM CHLORIDE 0.9 % IR SOLN
Status: DC | PRN
  Administered 2021-11-28: 1000 mL

## 2021-11-28 MED ORDER — METHOCARBAMOL 1000 MG/10ML IJ SOLN
1000.0000 mg | Freq: Four times a day (QID) | INTRAMUSCULAR | Status: DC | PRN
  Filled 2021-11-28: qty 10

## 2021-11-28 MED ORDER — ONDANSETRON HCL 4 MG/2ML IJ SOLN
INTRAMUSCULAR | Status: DC | PRN
  Administered 2021-11-28: 4 mg via INTRAVENOUS

## 2021-11-28 MED ORDER — ALUM & MAG HYDROXIDE-SIMETH 200-200-20 MG/5ML PO SUSP: 30.0000 mL | Freq: Four times a day (QID) | ORAL | Status: DC | PRN

## 2021-11-28 MED ORDER — OXYCODONE HCL 5 MG PO TABS: 5.0000 mg | ORAL_TABLET | Freq: Once | ORAL | Status: DC | PRN

## 2021-11-28 MED ORDER — BUPIVACAINE LIPOSOME 1.3 % IJ SUSP
INTRAMUSCULAR | Status: AC
  Filled 2021-11-28: qty 20

## 2021-11-28 MED ORDER — LACTATED RINGERS IV BOLUS: 1000.0000 mL | Freq: Three times a day (TID) | INTRAVENOUS | Status: DC | PRN

## 2021-11-28 MED ORDER — MAGIC MOUTHWASH
15.0000 mL | Freq: Four times a day (QID) | ORAL | Status: DC | PRN
Start: 2021-11-28 — End: 2021-11-29
  Filled 2021-11-28: qty 15

## 2021-11-28 MED ORDER — PROPOFOL 10 MG/ML IV BOLUS
INTRAVENOUS | Status: DC | PRN
  Administered 2021-11-28: 150 mg via INTRAVENOUS

## 2021-11-28 MED ORDER — SODIUM CHLORIDE 0.9 % IV SOLN
1000.0000 mL | INTRAVENOUS | Status: DC
  Administered 2021-11-28: 1000 mL via INTRAVENOUS

## 2021-11-28 MED ORDER — ACETAMINOPHEN 160 MG/5ML PO SOLN: 1000.0000 mg | Freq: Once | ORAL | Status: DC | PRN

## 2021-11-28 MED ORDER — DEXAMETHASONE SODIUM PHOSPHATE 10 MG/ML IJ SOLN
INTRAMUSCULAR | Status: AC
  Filled 2021-11-28: qty 1

## 2021-11-28 MED ORDER — MENTHOL 3 MG MT LOZG
1.0000 | LOZENGE | OROMUCOSAL | Status: DC | PRN
Start: 2021-11-28 — End: 2021-11-29

## 2021-11-28 MED ORDER — DIPHENHYDRAMINE HCL 50 MG/ML IJ SOLN: 12.5000 mg | Freq: Four times a day (QID) | INTRAMUSCULAR | Status: DC | PRN

## 2021-11-28 MED ORDER — BUPIVACAINE-EPINEPHRINE (PF) 0.25% -1:200000 IJ SOLN
INTRAMUSCULAR | Status: AC
  Filled 2021-11-28: qty 30

## 2021-11-28 MED ORDER — BUPIVACAINE-EPINEPHRINE 0.25% -1:200000 IJ SOLN
INTRAMUSCULAR | Status: DC | PRN
Start: 2021-11-28 — End: 2021-11-28
  Administered 2021-11-28: 30 mL

## 2021-11-28 MED ORDER — LIDOCAINE HCL (PF) 2 % IJ SOLN
INTRAMUSCULAR | Status: AC
  Filled 2021-11-28: qty 5

## 2021-11-28 MED ORDER — IOPAMIDOL (ISOVUE-300) INJECTION 61%
INTRAVENOUS | Status: DC | PRN
Start: 2021-11-28 — End: 2021-11-28
  Administered 2021-11-28: 10 mL

## 2021-11-28 MED ORDER — SODIUM CHLORIDE 0.9 % IV SOLN
250.0000 mL | INTRAVENOUS | Status: DC | PRN
Start: 2021-11-28 — End: 2021-11-29

## 2021-11-28 MED ORDER — PHENYLEPHRINE 40 MCG/ML (10ML) SYRINGE FOR IV PUSH (FOR BLOOD PRESSURE SUPPORT)
PREFILLED_SYRINGE | INTRAVENOUS | Status: DC | PRN
  Administered 2021-11-28 (×2): 120 ug via INTRAVENOUS
  Administered 2021-11-28 (×2): 80 ug via INTRAVENOUS
  Administered 2021-11-28: 120 ug via INTRAVENOUS

## 2021-11-28 MED ORDER — ONDANSETRON HCL 4 MG/2ML IJ SOLN
INTRAMUSCULAR | Status: AC
  Filled 2021-11-28: qty 2

## 2021-11-28 MED ORDER — BUPIVACAINE LIPOSOME 1.3 % IJ SUSP
INTRAMUSCULAR | Status: DC | PRN
  Administered 2021-11-28: 20 mL

## 2021-11-28 MED ORDER — HYDROMORPHONE HCL 1 MG/ML IJ SOLN
0.5000 mg | INTRAMUSCULAR | Status: DC | PRN
  Administered 2021-11-28: 1 mg via INTRAVENOUS
  Administered 2021-11-28: 0.5 mg via INTRAVENOUS
  Filled 2021-11-28 (×2): qty 1

## 2021-11-28 MED ORDER — PROPOFOL 10 MG/ML IV BOLUS
INTRAVENOUS | Status: AC
  Filled 2021-11-28: qty 20

## 2021-11-28 MED ORDER — ROCURONIUM BROMIDE 10 MG/ML (PF) SYRINGE
PREFILLED_SYRINGE | INTRAVENOUS | Status: DC | PRN
  Administered 2021-11-28: 80 mg via INTRAVENOUS

## 2021-11-28 SURGICAL SUPPLY — 52 items
APPLIER CLIP 5 13 M/L LIGAMAX5 (MISCELLANEOUS) ×2
APPLIER CLIP ROT 10 11.4 M/L (STAPLE)
APR CLP MED LRG 11.4X10 (STAPLE)
APR CLP MED LRG 5 ANG JAW (MISCELLANEOUS) ×1
BAG COUNTER SPONGE SURGICOUNT (BAG) ×3 IMPLANT
BAG SPEC RTRVL 10 TROC 200 (ENDOMECHANICALS) ×1
BAG SPNG CNTER NS LX DISP (BAG) ×1
CABLE HIGH FREQUENCY MONO STRZ (ELECTRODE) ×1 IMPLANT
CLIP APPLIE 5 13 M/L LIGAMAX5 (MISCELLANEOUS) IMPLANT
CLIP APPLIE ROT 10 11.4 M/L (STAPLE) IMPLANT
COVER SURGICAL LIGHT HANDLE (MISCELLANEOUS) ×3 IMPLANT
DEVICE TROCAR PUNCTURE CLOSURE (ENDOMECHANICALS) ×1 IMPLANT
DRAPE 3/4 80X56 (DRAPES) ×3 IMPLANT
DRAPE C-ARM 42X120 X-RAY (DRAPES) ×3 IMPLANT
DRAPE UTILITY XL STRL (DRAPES) ×3 IMPLANT
DRAPE WARM FLUID 44X44 (DRAPES) ×3 IMPLANT
DRSG IV TEGADERM 3.5X4.5 STRL (GAUZE/BANDAGES/DRESSINGS) ×1 IMPLANT
DRSG TEGADERM 2-3/8X2-3/4 SM (GAUZE/BANDAGES/DRESSINGS) ×6 IMPLANT
DRSG TEGADERM 6X8 (GAUZE/BANDAGES/DRESSINGS) ×3 IMPLANT
DRSG TELFA 3X8 NADH (GAUZE/BANDAGES/DRESSINGS) ×2 IMPLANT
ELECT REM PT RETURN 15FT ADLT (MISCELLANEOUS) ×3 IMPLANT
ENDOLOOP SUT PDS II  0 18 (SUTURE) ×2
ENDOLOOP SUT PDS II 0 18 (SUTURE) IMPLANT
GAUZE SPONGE 2X2 8PLY STRL LF (GAUZE/BANDAGES/DRESSINGS) IMPLANT
GLOVE SURG NEOPR MICRO LF SZ8 (GLOVE) ×3 IMPLANT
GLOVE SURG UNDER LTX SZ8 (GLOVE) ×3 IMPLANT
GOWN STRL REUS W/TWL XL LVL3 (GOWN DISPOSABLE) ×6 IMPLANT
IRRIG SUCT STRYKERFLOW 2 WTIP (MISCELLANEOUS) ×2
IRRIGATION SUCT STRKRFLW 2 WTP (MISCELLANEOUS) ×2 IMPLANT
KIT BASIN OR (CUSTOM PROCEDURE TRAY) ×3 IMPLANT
KIT TURNOVER KIT A (KITS) IMPLANT
NDL BIOPSY 14X6 SOFT TISS (NEEDLE) IMPLANT
NEEDLE BIOPSY 14X6 SOFT TISS (NEEDLE) ×2 IMPLANT
PAD DRESSING TELFA 3X8 NADH (GAUZE/BANDAGES/DRESSINGS) IMPLANT
PENCIL SMOKE EVACUATOR (MISCELLANEOUS) IMPLANT
POUCH RETRIEVAL ECOSAC 10 (ENDOMECHANICALS) ×2 IMPLANT
POUCH RETRIEVAL ECOSAC 10MM (ENDOMECHANICALS) ×2
SCISSORS LAP 5X35 DISP (ENDOMECHANICALS) ×3 IMPLANT
SET CHOLANGIOGRAPH MIX (MISCELLANEOUS) ×3 IMPLANT
SET TUBE SMOKE EVAC HIGH FLOW (TUBING) ×3 IMPLANT
SLEEVE XCEL OPT CAN 5 100 (ENDOMECHANICALS) ×1 IMPLANT
SPIKE FLUID TRANSFER (MISCELLANEOUS) ×3 IMPLANT
SPONGE GAUZE 2X2 STER 10/PKG (GAUZE/BANDAGES/DRESSINGS) ×1
SUT MNCRL AB 4-0 PS2 18 (SUTURE) ×3 IMPLANT
SUT PDS AB 1 CT  36 (SUTURE) ×2
SUT PDS AB 1 CT 36 (SUTURE) IMPLANT
SYR 20ML LL LF (SYRINGE) ×3 IMPLANT
TOWEL OR 17X26 10 PK STRL BLUE (TOWEL DISPOSABLE) ×3 IMPLANT
TOWEL OR NON WOVEN STRL DISP B (DISPOSABLE) ×3 IMPLANT
TRAY LAPAROSCOPIC (CUSTOM PROCEDURE TRAY) ×3 IMPLANT
TROCAR BLADELESS OPT 5 100 (ENDOMECHANICALS) ×3 IMPLANT
TROCAR XCEL NON-BLD 11X100MML (ENDOMECHANICALS) ×3 IMPLANT

## 2021-11-28 NOTE — Progress Notes (Signed)
PROGRESS NOTE    Miranda Mueller  KZS:[id number] DOB: 1941-09-18 DOA: 11/25/2021 PCP: Jearld Fenton, NP     Brief Narrative:   Miranda Mueller is a 81 y.o. female with medical history significant of remote h/o breast CA s/p lumpectomy and XRT in remission, h/o HTN, HLD, presented to drop her H ED due to abdominal pain nausea and vomiting, found to have gallstone pancreatitis  Subjective:  She is seen after return from surgery, reports some right lower ab pain and left lower ab pain   no nausea, no vomiting, no fever   Assessment & Plan:  Principal Problem:   Acute pancreatitis Active Problems:   Essential hypertension   History of breast cancer   Gallstone pancreatitis   Hypokalemia   Gallstone pancreatitis -on 2/17 s/p lap chole, IOC no obstruction, no leak, no stricture --Management per general surgery ,appreciate general surgery input   Hypokalemia/hypomagnesemia Hold HCTZ Remain low, continue replace, recheck in the morning    Hypertension Hold Benicar perioperatively and in the setting of acute pancreatitis Hold HCTZ due to hypokalemia  Continue as needed labetalol Blood pressure stable so far without BP meds, monitor Plan to resume Benicar tomorrow   Hyperlipidemia Hold statin due to abnormal LFT, plan to resume statin at discharge   A right breast 4.9 x 2.9 cm fluid dense lesion with associated surgical clips.  Seen on CT this time She denies any discomfort, does not aware of any new lump on breast exam H/o right breast CA s/p lumpectomy and radiation, in remission, she was discharged from oncology clinic 5 years ago, last mammogram in June 2022 unremarkable, she reports have a mammogram scheduled in June this year Case discussed with oncology Dr. Lindi Adie over the phone who will arrange outpatient mammogram after she is discharged from the hospital   Body mass index is 42.29 kg/m.  Meet class III obesity criteria Denies OSA history   I have Reviewed  nursing notes, Vitals, and Lab results since pt's last encounter.  Detail please refer to discussion under assessment and plan  I ordered the following labs:  Unresulted Labs (From admission, onward)     Start     Ordered   11/29/21 0500  Comprehensive metabolic panel  Tomorrow morning,   R       Question:  Specimen collection method  Answer:  IV Team   11/28/21 0951   11/29/21 0500  CBC  Tomorrow morning,   R       Question:  Specimen collection method  Answer:  IV Team   11/28/21 0951   11/29/21 0500  Lipase, blood  Tomorrow morning,   R       Question:  Specimen collection method  Answer:  IV Team   11/28/21 0951             DVT prophylaxis: SCDs Start: 11/26/21 1824   Code Status:   Code Status: DNR  Family Communication: husband and daughter at bedside on 2/16 Disposition:   Status is: Inpatient   Dispo: The patient is from: Home              Anticipated d/c is to: Home              Anticipated d/c date is: Possible tomorrow if cleared by general surgery  Antimicrobials:   Anti-infectives (From admission, onward)    Start     Dose/Rate Route Frequency Ordered Stop   11/28/21 0600  cefTRIAXone (ROCEPHIN) 2 g  in sodium chloride 0.9 % 100 mL IVPB        2 g 200 mL/hr over 30 Minutes Intravenous On call to O.R. 11/27/21 0955 11/28/21 0754           Objective: Vitals:   11/28/21 0945 11/28/21 1000 11/28/21 1015 11/28/21 1102  BP: (!) 155/78 (!) 156/73  (!) 145/69  Pulse: 87 77 81 78  Resp: 18 16 18 20   Temp:    98 F (36.7 C)  TempSrc:      SpO2: 97% 97% 97% 90%  Weight:      Height:        Intake/Output Summary (Last 24 hours) at 11/28/2021 1516 Last data filed at 11/28/2021 0932 Gross per 24 hour  Intake 2076 ml  Output 250 ml  Net 1826 ml   Filed Weights   11/25/21 2149  Weight: 118.8 kg    Examination:  General exam: alert, awake, communicative,calm, NAD Respiratory system: Clear to auscultation. Respiratory effort  normal. Cardiovascular system:  RRR.  Gastrointestinal system: Post lap chole changes ,incision CDI ,abdomen is nondistended,   Hypoactive bowel sounds Central nervous system: Alert and oriented. No focal neurological deficits. Extremities:  no edema Skin: No rashes, lesions or ulcers Psychiatry: Judgement and insight appear normal. Mood & affect appropriate.     Data Reviewed: I have personally reviewed  labs and visualized  imaging studies since the last encounter and formulate the plan        Scheduled Meds:  acetaminophen  500 mg Oral TID WC & HS   Chlorhexidine Gluconate Cloth  6 each Topical Once   cycloSPORINE  1 drop Both Eyes BID   gabapentin  200 mg Oral TID   lip balm  1 application Topical BID   loratadine  10 mg Oral Daily   pantoprazole  40 mg Oral Daily   polycarbophil  625 mg Oral BID   sodium chloride flush  3 mL Intravenous Q12H   zinc sulfate  220 mg Oral Daily   Continuous Infusions:  sodium chloride     sodium chloride 1,000 mL (11/28/21 1230)   lactated ringers     methocarbamol (ROBAXIN) IV       LOS: 2 days      Florencia Reasons, MD PhD FACP [facility]  Available via Epic secure chat 7am-7pm for nonurgent issues Please page for urgent issues To page the attending provider between 7A-7P or the covering provider during after hours 7P-7A, please log into the web site [email or web address] and access using universal [place] password for that web site. If you do not have the password, please call the hospital operator.    11/28/2021, 3:16 PM

## 2021-11-28 NOTE — Discharge Instructions (Signed)
################################################################ ? ?LAPAROSCOPIC SURGERY: POST OP INSTRUCTIONS ? ?###################################################################### ? ?EAT ?Gradually transition to a high fiber diet with a fiber supplement over the next few weeks after discharge.  Start with a pureed / full liquid diet (see below) ? ?WALK ?Walk an hour a day.  Control your pain to do that.   ? ?CONTROL PAIN ?Control pain so that you can walk, sleep, tolerate sneezing/coughing, go up/down stairs. ? ?HAVE A BOWEL MOVEMENT DAILY ?Keep your bowels regular to avoid problems.  OK to try a laxative to override constipation.  OK to use an antidairrheal to slow down diarrhea.  Call if not better after 2 tries ? ?CALL IF YOU HAVE PROBLEMS/CONCERNS ?Call if you are still struggling despite following these instructions. ?Call if you have concerns not answered by these instructions ? ?###################################################################### ? ? ? ?DIET: Follow a light bland diet & liquids the first 24 hours after arrival home, such as soup, liquids, starches, etc.  Be sure to drink plenty of fluids.  Quickly advance to a usual solid diet within a few days.  Avoid fast food or heavy meals as your are more likely to get nauseated or have irregular bowels.  A low-fat, high-fiber diet for the rest of your life is ideal. ? ?Take your usually prescribed home medications unless otherwise directed. ? ?PAIN CONTROL: ?Pain is best controlled by a usual combination of three different methods TOGETHER: ?Ice/Heat ?Over the counter pain medication ?Prescription pain medication ?Most patients will experience some swelling and bruising around the incisions.  Ice packs or heating pads (30-60 minutes up to 6 times a day) will help. Use ice for the first few days to help decrease swelling and bruising, then switch to heat to help relax tight/sore spots and speed recovery.  Some people prefer to use ice alone, heat  alone, alternating between ice & heat.  Experiment to what works for you.  Swelling and bruising can take several weeks to resolve.   ?It is helpful to take an over-the-counter pain medication regularly for the first few weeks.  Choose one of the following that works best for you: ?Naproxen (Aleve, etc)  Two 220mg tabs twice a day ?Ibuprofen (Advil, etc) Three 200mg tabs four times a day (every meal & bedtime) ?Acetaminophen (Tylenol, etc) 500-650mg four times a day (every meal & bedtime) ?A  prescription for pain medication (such as oxycodone, hydrocodone, tramadol, gabapentin, methocarbamol, etc) should be given to you upon discharge.  Take your pain medication as prescribed.  ?If you are having problems/concerns with the prescription medicine (does not control pain, nausea, vomiting, rash, itching, etc), please call us [phone] to see if we need to switch you to a different pain medicine that will work better for you and/or control your side effect better. ?If you need a refill on your pain medication, please give us 48 hour notice.  contact your pharmacy.  They will contact our office to request authorization. Prescriptions will not be filled after 5 pm or on week-ends ? ?Avoid getting constipated.   ?Between the surgery and the pain medications, it is common to experience some constipation.   ?Increasing fluid intake and taking a fiber supplement (such as Metamucil, Citrucel, FiberCon, MiraLax, etc) 1-2 times a day regularly will usually help prevent this problem from occurring.   ?A mild laxative (prune juice, Milk of Magnesia, MiraLax, etc) should be taken according to package directions if there are no bowel movements after 48 hours.   ?Watch out for diarrhea.   ?  If you have many loose bowel movements, simplify your diet to bland foods & liquids for a few days.   ?Stop any stool softeners and decrease your fiber supplement.   ?Switching to mild anti-diarrheal medications (Kayopectate, Pepto Bismol) can  help.   ?If this worsens or does not improve, please call us. ? ?Wash / shower every day.  You may shower over the dressings as they are waterproof.  Continue to shower over incision(s) after the dressing is off. ? ?REMOVE ALL DRESSINGS: Remove your waterproof bandages (tegaderm clear band-aids, steristrip skin tapes, etc) THREE DAYS AFTER SURGERY.  You may leave the incisions open to air.  You may replace a dressing/Band-Aid to cover the incision for comfort if you wish.  ? ?ACTIVITIES as tolerated:   ?You may resume regular (light) daily activities beginning the next day--such as daily self-care, walking, climbing stairs--gradually increasing activities as tolerated.  If you can walk 30 minutes without difficulty, it is safe to try more intense activity such as jogging, treadmill, bicycling, low-impact aerobics, swimming, etc. ?Save the most intensive and strenuous activity for last such as sit-ups, heavy lifting, contact sports, etc  Refrain from any heavy lifting or straining until you are off narcotics for pain control.   ?DO NOT PUSH THROUGH PAIN.  Let pain be your guide: If it hurts to do something, don't do it.  Pain is your body warning you to avoid that activity for another week until the pain goes down. ?You may drive when you are no longer taking prescription pain medication, you can comfortably wear a seatbelt, and you can safely maneuver your car and apply brakes. ?You may have sexual intercourse when it is comfortable. ? ?FOLLOW UP in our office ?Please call [facility] at [phone] to set up an appointment to see your surgeon in the office for a follow-up appointment approximately 2-3 weeks after your surgery. ?Make sure that you call for this appointment the day you arrive home to insure a convenient appointment time. ? ?10. IF YOU HAVE DISABILITY OR FAMILY LEAVE FORMS, BRING THEM TO THE OFFICE FOR PROCESSING.  DO NOT GIVE THEM TO YOUR DOCTOR. ? ? ?WHEN TO CALL US [phone]: ?Poor pain  control ?Reactions / problems with new medications (rash/itching, nausea, etc)  ?Fever over 101.5 F (38.5 C) ?Inability to urinate ?Nausea and/or vomiting ?Worsening swelling or bruising ?Continued bleeding from incision. ?Increased pain, redness, or drainage from the incision ? ? The clinic staff is available to answer your questions during regular business hours (8:30am-5pm).  Please don?t hesitate to call and ask to speak to one of our nurses for clinical concerns.  ? If you have a medical emergency, go to the nearest emergency room or call 911. ? A surgeon from [facility] is always on call at the hospitals ? ? ?[facility] ?[address], [address], , [place]  [address] ? ?MAIN: [phone] ? TOLL FREE: [phone] ?  ?FAX [phone] ?[email or web address] ? ?############################################################## ? ? ? ?

## 2021-11-28 NOTE — Plan of Care (Signed)

## 2021-11-28 NOTE — Anesthesia Procedure Notes (Signed)
Procedure Name: Intubation Date/Time: 11/28/2021 7:48 AM Performed by: Raenette Rover, CRNA Pre-anesthesia Checklist: Patient identified, Emergency Drugs available, Suction available and Patient being monitored Patient Re-evaluated:Patient Re-evaluated prior to induction Oxygen Delivery Method: Circle system utilized Preoxygenation: Pre-oxygenation with 100% oxygen Induction Type: IV induction Ventilation: Mask ventilation without difficulty Laryngoscope Size: Mac and 3 Grade View: Grade I Tube type: Oral Tube size: 7.0 mm Number of attempts: 1 Airway Equipment and Method: Stylet Placement Confirmation: ETT inserted through vocal cords under direct vision, positive ETCO2 and breath sounds checked- equal and bilateral Secured at: 22 cm Tube secured with: Tape Dental Injury: Teeth and Oropharynx as per pre-operative assessment

## 2021-11-28 NOTE — Interval H&P Note (Signed)
History and Physical Interval Note:  11/28/2021 7:13 AM  Miranda Mueller  has presented today for surgery, with the diagnosis of GALLSTONE PANCREATITIS.  The various methods of treatment have been discussed with the patient and family. After consideration of risks, benefits and other options for treatment, the patient has consented to  Procedure(s): LAPAROSCOPIC CHOLECYSTECTOMY WITH INTRAOPERATIVE CHOLANGIOGRAM (N/A) as a surgical intervention.  The patient's history has been reviewed, patient examined, no change in status, stable for surgery.  I have reviewed the patient's chart and labs.  Questions were answered to the patient's satisfaction.    I have re-reviewed the the patient's records, history, medications, and allergies.  I have re-examined the patient.  I again discussed intraoperative plans and goals of post-operative recovery.  The patient agrees to proceed.  Miranda Mueller  Nov 30, 1940 [id number]  Patient Care Team: Jearld Fenton, NP as PCP - General (Internal Medicine) Nicholas Lose, MD as Consulting Physician (Hematology and Oncology) Excell Seltzer, MD (Inactive) as Consulting Physician (General Surgery) Eppie Gibson, MD as Attending Physician (Radiation Oncology) Holley Bouche, NP (Inactive) as Nurse Practitioner (Nurse Practitioner)  Patient Active Problem List   Diagnosis Date Noted   Acute pancreatitis 11/26/2021   Gallstone pancreatitis 11/26/2021   Hypokalemia 11/26/2021   CKD (chronic kidney disease) stage 3, GFR 30-59 ml/min ([place]) 07/03/2021   Morbid obesity ([place]) 07/03/2021   Prediabetes 07/02/2020   Chronic midline low back pain with right-sided sciatica 07/02/2020   Eczema 07/02/2020   OSA (obstructive sleep apnea) 08/27/2015   History of breast cancer 08/20/2014   HLD (hyperlipidemia) 11/23/2007   Essential hypertension 11/23/2007   GERD 11/23/2007    Past Medical History:  Diagnosis Date   Bradycardia    Breast cancer ([place]) 08/29/14   Right  Breast -High Grade Invasive Mammary   Cataract    surgery   GERD (gastroesophageal reflux disease)    History of shingles    Hx of radiation therapy 10/01/14- 11/20/14   right breast/50.4 Gy/28 fx; right breast boost/10 Gy/5 fx   Hyperlipidemia    Hypertension    IBS (irritable bowel syndrome)    Multifocal PVCs    PVC (premature ventricular contraction)    Hx of   Sleep apnea    no cpap per pt     Past Surgical History:  Procedure Laterality Date   BREAST LUMPECTOMY Right 08/29/14   started radiation 10/01/14   CATARACT EXTRACTION  2009   bilateral   COLONOSCOPY  2004-last 2016   normal   KNEE ARTHROSCOPY     right   NEEDLE CORE BIOPSY  RIGHT BREAST Right 08/14/14   OTHER SURGICAL HISTORY Left 1997   breast,cyst   PYLOROPLASTY  2016   RADIOACTIVE SEED GUIDED PARTIAL MASTECTOMY WITH AXILLARY SENTINEL LYMPH NODE BIOPSY Right 08/29/2014   Procedure: SEED LOCALIZED RIGHT BREAST LUMPECTOMY AND RIGHT AXILLARY SENTTINEL LYMPH NODE BIOPSY;  Surgeon: Excell Seltzer, MD;  Location: [facility];  Service: General;  Laterality: Right;   WISDOM TOOTH EXTRACTION      Social History   Socioeconomic History   Marital status: Married    Spouse name: Homer   Number of children: 1   Years of education: 12   Highest education level: Not on file  Occupational History    Comment: retired  Tobacco Use   Smoking status: Never   Smokeless tobacco: Never  Vaping Use   Vaping Use: Never used  Substance and Sexual Activity   Alcohol use: No  Alcohol/week: 0.0 standard drinks   Drug use: No   Sexual activity: Never  Other Topics Concern   Not on file  Social History Narrative   Consumes one glass of caffeine daily   Social Determinants of Health   Financial Resource Strain: Not on file  Food Insecurity: Not on file  Transportation Needs: Not on file  Physical Activity: Not on file  Stress: Not on file  Social Connections: Not on file  Intimate Partner  Violence: Not on file    Family History  Problem Relation Age of Onset   Hypertension Mother    Heart disease Mother        CAD   Breast cancer Mother 37   Hypertension Sister    Breast cancer Sister 85   Aneurysm Sister    Hypertension Brother    Prostate cancer Brother 70   Leukemia Maternal Uncle 50   Heart disease Maternal Grandmother    Leukemia Maternal Uncle 66   Breast cancer Cousin        dx under 52   Colon cancer Cousin        dx in her 32s   Colon cancer Cousin    Lymphoma Other 67       NHL   Colon polyps Daughter    Stroke Father    Esophageal cancer Neg Hx    Stomach cancer Neg Hx    Rectal cancer Neg Hx     Medications Prior to Admission  Medication Sig Dispense Refill Last Dose   acetaminophen (TYLENOL) 500 MG tablet Take 500 mg by mouth every 6 (six) hours as needed.   11/25/2021   aspirin 81 MG tablet Take 81 mg by mouth daily.   11/25/2021   atorvastatin (LIPITOR) 10 MG tablet TAKE 1 TABLET DAILY 90 tablet 3 11/25/2021   cycloSPORINE (RESTASIS) 0.05 % ophthalmic emulsion Place 1 drop into both eyes 2 (two) times daily.     11/25/2021   esomeprazole (NEXIUM) 40 MG capsule TAKE 1 CAPSULE DAILY AT 12 NOON 90 capsule 2 11/25/2021   fexofenadine (ALLEGRA) 180 MG tablet Take 180 mg by mouth daily.   11/25/2021   hydrochlorothiazide (HYDRODIURIL) 25 MG tablet TAKE 1 TABLET DAILY 90 tablet 2 11/25/2021   Multiple Vitamins-Minerals (HAIR/SKIN/NAILS PO) Take 1 tablet by mouth daily.   11/25/2021   olmesartan (BENICAR) 20 MG tablet TAKE 1 TABLET DAILY 90 tablet 1 11/25/2021   Potassium 99 MG TABS Take 1 tablet by mouth daily.     11/25/2021   Zinc 50 MG CAPS Take by mouth.   11/25/2021   betamethasone dipropionate 0.05 % cream APPLY TOPICALLY AS NEEDED 30 g 11    fluticasone (FLONASE) 50 MCG/ACT nasal spray TAKE 2 SPRAYS INTO BOTH NOSTRILS DAILY 16 g 4    methocarbamol (ROBAXIN) 500 MG tablet TAKE 1 TABLET DAILY AS NEEDED FOR MUSCLE SPASMS 30 tablet 11     Current  Facility-Administered Medications  Medication Dose Route Frequency Provider Last Rate Last Admin   0.9 %  sodium chloride infusion  1,000 mL Intravenous Continuous Florencia Reasons, MD 125 mL/hr at 11/28/21 0432 1,000 mL at 11/28/21 0432   bupivacaine liposome (EXPAREL) 1.3 % injection 266 mg  20 mL Infiltration On Call to OR Nyoka Cowden, Terri L, RPH       cefTRIAXone (ROCEPHIN) 2 g in sodium chloride 0.9 % 100 mL IVPB  2 g Intravenous On Call to OR Michael Boston, MD       Chlorhexidine Gluconate  Cloth 2 % PADS 6 each  6 each Topical Once Minda Ditto, RPH       Chlorhexidine Gluconate Cloth 2 % PADS 6 each  6 each Topical Once Florencia Reasons, MD       [facility] Hold] cycloSPORINE (RESTASIS) 0.05 % ophthalmic emulsion 1 drop  1 drop Both Eyes BID Florencia Reasons, MD   1 drop at 11/27/21 2118   [MAR Hold] fluticasone (FLONASE) 50 MCG/ACT nasal spray 1 spray  1 spray Each Nare Daily PRN Florencia Reasons, MD       [facility] Hold] HYDROmorphone (DILAUDID) injection 0.5 mg  0.5 mg Intravenous Q4H PRN Florencia Reasons, MD   0.5 mg at 11/27/21 2124   [MAR Hold] labetalol (NORMODYNE) injection 5 mg  5 mg Intravenous Q2H PRN Florencia Reasons, MD   5 mg at 11/26/21 1736   lactated ringers infusion   Intravenous Continuous Michael Boston, MD 50 mL/hr at 11/28/21 1610 Continued from Pre-op at 11/28/21 0638   [MAR Hold] loratadine (CLARITIN) tablet 10 mg  10 mg Oral Daily Florencia Reasons, MD   10 mg at 11/27/21 1018   [MAR Hold] methocarbamol (ROBAXIN) tablet 500 mg  500 mg Oral Q8H PRN Florencia Reasons, MD       [facility] Hold] ondansetron [facility]) injection 4 mg  4 mg Intravenous Q6H PRN Florencia Reasons, MD       [facility] Hold] pantoprazole (PROTONIX) EC tablet 40 mg  40 mg Oral Daily Florencia Reasons, MD   40 mg at 11/27/21 1018   [MAR Hold] zinc sulfate capsule 220 mg  220 mg Oral Daily Florencia Reasons, MD   220 mg at 11/27/21 1018     No Known Allergies  BP (!) 167/59 (BP Location: Left Leg)    Pulse 90    Temp 98.5 F (36.9 C) (Oral)    Resp 18    Ht 5\' 6"  (1.676 m)    Wt 118.8 kg    SpO2 95%    BMI  42.29 kg/m   Labs: Results for orders placed or performed during the hospital encounter of 11/25/21 (from the past 48 hour(s))  Comprehensive metabolic panel     Status: Abnormal   Collection Time: 11/26/21  5:36 PM  Result Value Ref Range   Sodium 136 135 - 145 mmol/L   Potassium 3.2 (L) 3.5 - 5.1 mmol/L   Chloride 103 98 - 111 mmol/L   CO2 25 22 - 32 mmol/L   Glucose, Bld 100 (H) 70 - 99 mg/dL    Comment: Glucose reference range applies only to samples taken after fasting for at least 8 hours.   BUN 16 8 - 23 mg/dL   Creatinine, Ser 0.77 0.44 - 1.00 mg/dL   Calcium 8.3 (L) 8.9 - 10.3 mg/dL   Total Protein 7.1 6.5 - 8.1 g/dL   Albumin 3.3 (L) 3.5 - 5.0 g/dL   AST 91 (H) 15 - 41 U/L   ALT 134 (H) 0 - 44 U/L   Alkaline Phosphatase 69 38 - 126 U/L   Total Bilirubin 3.7 (H) 0.3 - 1.2 mg/dL   GFR, Estimated >60 >60 mL/min    Comment: (NOTE) Calculated using the CKD-EPI Creatinine Equation (2021)    Anion gap 8 5 - 15    Comment: Performed at [facility], [place] [address], [place], [place] [address]  Lipase, blood     Status: Abnormal   Collection Time: 11/26/21  5:36 PM  Result Value Ref Range  Lipase 1,069 (H) 11 - 51 U/L    Comment: RESULTS CONFIRMED BY MANUAL DILUTION Performed at [facility], [place] [address] [place], [place] [address]   Magnesium     Status: None   Collection Time: 11/26/21  5:36 PM  Result Value Ref Range   Magnesium 1.8 1.7 - 2.4 mg/dL    Comment: Performed at [facility], [place] [address], [place], [place] [address]  Magnesium     Status: None   Collection Time: 11/27/21  4:46 AM  Result Value Ref Range   Magnesium 1.8 1.7 - 2.4 mg/dL    Comment: Performed at [facility], [place] [address], [place], [place] [address]  CBC     Status: Abnormal   Collection Time: 11/27/21  4:46 AM  Result Value Ref Range   WBC 10.7 (H) 4.0 - 10.5 K/uL   RBC 3.66 (L) 3.87 - 5.11 MIL/uL    Hemoglobin 10.9 (L) 12.0 - 15.0 g/dL   HCT 32.1 (L) 36.0 - 46.0 %   MCV 87.7 80.0 - 100.0 fL   MCH 29.8 26.0 - 34.0 pg   MCHC 34.0 30.0 - 36.0 g/dL   RDW 13.5 11.5 - 15.5 %   Platelets 276 150 - 400 K/uL   nRBC 0.0 0.0 - 0.2 %    Comment: Performed at [facility], [place] [address] [place], [place] [address]  Comprehensive metabolic panel     Status: Abnormal   Collection Time: 11/27/21  4:46 AM  Result Value Ref Range   Sodium 137 135 - 145 mmol/L   Potassium 2.8 (L) 3.5 - 5.1 mmol/L   Chloride 107 98 - 111 mmol/L   CO2 24 22 - 32 mmol/L   Glucose, Bld 95 70 - 99 mg/dL    Comment: Glucose reference range applies only to samples taken after fasting for at least 8 hours.   BUN 13 8 - 23 mg/dL   Creatinine, Ser 0.70 0.44 - 1.00 mg/dL   Calcium 7.9 (L) 8.9 - 10.3 mg/dL   Total Protein 6.1 (L) 6.5 - 8.1 g/dL   Albumin 3.1 (L) 3.5 - 5.0 g/dL   AST 58 (H) 15 - 41 U/L   ALT 97 (H) 0 - 44 U/L   Alkaline Phosphatase 67 38 - 126 U/L   Total Bilirubin 3.3 (H) 0.3 - 1.2 mg/dL   GFR, Estimated >60 >60 mL/min    Comment: (NOTE) Calculated using the CKD-EPI Creatinine Equation (2021)    Anion gap 6 5 - 15    Comment: Performed at [facility], [place] [address] [place], [place] [address]  Protime-INR     Status: None   Collection Time: 11/27/21  4:46 AM  Result Value Ref Range   Prothrombin Time 15.0 11.4 - 15.2 seconds   INR 1.2 0.8 - 1.2    Comment: (NOTE) INR goal varies based on device and disease states. Performed at [facility], [place] [address], [place], [place] [address]   Lipase, blood     Status: Abnormal   Collection Time: 11/27/21  4:46 AM  Result Value Ref Range   Lipase 496 (H) 11 - 51 U/L    Comment: RESULTS CONFIRMED BY MANUAL DILUTION Performed at [facility], [place] [address] [place], [place] [address]   Comprehensive metabolic panel     Status: Abnormal   Collection Time: 11/28/21  5:09 AM   Result Value Ref Range  Sodium 135 135 - 145 mmol/L   Potassium 3.3 (L) 3.5 - 5.1 mmol/L   Chloride 107 98 - 111 mmol/L   CO2 20 (L) 22 - 32 mmol/L   Glucose, Bld 133 (H) 70 - 99 mg/dL    Comment: Glucose reference range applies only to samples taken after fasting for at least 8 hours.   BUN 10 8 - 23 mg/dL   Creatinine, Ser 0.77 0.44 - 1.00 mg/dL   Calcium 8.3 (L) 8.9 - 10.3 mg/dL   Total Protein 6.6 6.5 - 8.1 g/dL   Albumin 3.2 (L) 3.5 - 5.0 g/dL   AST 43 (H) 15 - 41 U/L   ALT 76 (H) 0 - 44 U/L   Alkaline Phosphatase 77 38 - 126 U/L   Total Bilirubin 2.3 (H) 0.3 - 1.2 mg/dL   GFR, Estimated >60 >60 mL/min    Comment: (NOTE) Calculated using the CKD-EPI Creatinine Equation (2021)    Anion gap 8 5 - 15    Comment: Performed at [facility], [place] [address] [place], [place] [address]  CBC     Status: Abnormal   Collection Time: 11/28/21  5:09 AM  Result Value Ref Range   WBC 7.2 4.0 - 10.5 K/uL   RBC 3.83 (L) 3.87 - 5.11 MIL/uL   Hemoglobin 11.1 (L) 12.0 - 15.0 g/dL   HCT 33.9 (L) 36.0 - 46.0 %   MCV 88.5 80.0 - 100.0 fL   MCH 29.0 26.0 - 34.0 pg   MCHC 32.7 30.0 - 36.0 g/dL   RDW 13.4 11.5 - 15.5 %   Platelets 273 150 - 400 K/uL   nRBC 0.0 0.0 - 0.2 %    Comment: Performed at [facility], [place] [address] [place], [place] [address]  Lipase, blood     Status: Abnormal   Collection Time: 11/28/21  5:09 AM  Result Value Ref Range   Lipase 183 (H) 11 - 51 U/L    Comment: Performed at [facility], [place] [address] [place], [place] [address]  Magnesium     Status: None   Collection Time: 11/28/21  5:09 AM  Result Value Ref Range   Magnesium 2.1 1.7 - 2.4 mg/dL    Comment: Performed at [facility], [place] [address] [place], [place] [address]    Imaging / Studies: CT ABDOMEN PELVIS W CONTRAST  Result Date: 11/26/2021 CLINICAL DATA:  Pancreatitis, acute, severe EXAM: CT ABDOMEN AND PELVIS WITH  CONTRAST TECHNIQUE: Multidetector CT imaging of the abdomen and pelvis was performed using the standard protocol following bolus administration of intravenous contrast. RADIATION DOSE REDUCTION: This exam was performed according to the departmental dose-optimization program which includes automated exposure control, adjustment of the mA and/or kV according to patient size and/or use of iterative reconstruction technique. CONTRAST:  163mL OMNIPAQUE IOHEXOL 300 MG/ML  SOLN COMPARISON:  MRI breast 08/21/2014 FINDINGS: Lower chest: Coronary artery calcifications. No acute abnormality. Slightly patulous distal esophagus versus tiny hiatal hernia. Hepatobiliary: The liver is enlarged measuring up to 24 cm. The hepatic parenchyma is slightly diffusely hypodense compared to the splenic parenchyma consistent with fatty infiltration. No focal liver abnormality. Multiple calcified gallstone within the gallbladder lumen. No gallbladder wall thickening or pericholecystic fluid. No biliary dilatation. No CT findings suggest choledocholithiasis. Pancreas: Diffusely atrophic. No focal lesion. Diffuse peripancreatic fat stranding and trace free fluid. No main pancreatic ductal dilatation. No pseudocyst formation. Spleen: Normal in size without focal abnormality. Adrenals/Urinary Tract:  No adrenal nodule bilaterally. Bilateral kidneys enhance symmetrically. No hydronephrosis. No hydroureter. The urinary bladder is unremarkable. On delayed imaging, there is no urothelial wall thickening and there are no filling defects in the opacified portions of the bilateral collecting systems or ureters. Stomach/Bowel: Stomach is within normal limits. No evidence of bowel wall thickening or dilatation. Scattered colonic diverticulosis. Appendix appears normal. Vascular/Lymphatic: No abdominal aorta or iliac aneurysm. Moderate to severe atherosclerotic plaque of the aorta and its branches. No abdominal, pelvic, or inguinal lymphadenopathy.  Reproductive: Uterus and bilateral adnexa are unremarkable. Other: No intraperitoneal free fluid. No intraperitoneal free gas. No organized fluid collection. Musculoskeletal: No abdominal wall hernia or abnormality. There is a right breast 4.9 x 2.9 cm fluid dense lesion with associated surgical clips. No suspicious lytic or blastic osseous lesions. No acute displaced fracture. Multilevel degenerative changes of the spine with intervertebral disc space vacuum phenomenon at the L1 through L5 levels. IMPRESSION: 1. Acute pancreatitis.  No pseudocyst formation. 2. Cholelithiasis. 3. Hepatomegaly and hepatic steatosis. 4.  Aortic Atherosclerosis (ICD10-I70.0). 5. A right breast 4.9 x 2.9 cm fluid dense lesion with associated surgical clips. Correlate with mammography. Electronically Signed   By: Iven Finn M.D.   On: 11/26/2021 00:38   US Abdomen Limited RUQ (LIVER/GB)  Result Date: 11/26/2021 CLINICAL DATA:  Right upper quadrant pain, vomiting EXAM: ULTRASOUND ABDOMEN LIMITED RIGHT UPPER QUADRANT COMPARISON:  None. FINDINGS: Gallbladder: Gallstones within the gallbladder measuring up to 1.3 cm. No wall thickening. Common bile duct: Diameter: Normal caliber, 4-5 mm Liver: Mildly increased echotexture throughout the liver suggesting fatty infiltration. No focal hepatic abnormality. Portal vein is patent on color Doppler imaging with normal direction of blood flow towards the liver. Other: None. IMPRESSION: Cholelithiasis. Hepatic steatosis. Electronically Signed   By: Rolm Baptise M.D.   On: 11/26/2021 01:13     .Adin Hector, M.D., F.A.C.S. Gastrointestinal and Minimally Invasive Surgery [facility] [address], [place], [place] [address] [phone] Main / Paging  11/28/2021 7:14 AM    Adin Hector

## 2021-11-28 NOTE — Transfer of Care (Signed)
Immediate Anesthesia Transfer of Care Note  Patient: Miranda Mueller  Procedure(s) Performed: LAPAROSCOPIC CHOLECYSTECTOMY WITH INTRAOPERATIVE CHOLANGIOGRAM, LIVER BIOPSY  Patient Location: PACU  Anesthesia Type:General  Level of Consciousness: drowsy and patient cooperative  Airway & Oxygen Therapy: Patient Spontanous Breathing and Patient connected to face mask oxygen  Post-op Assessment: Report given to RN and Post -op Vital signs reviewed and stable  Post vital signs: Reviewed and stable  Last Vitals:  Vitals Value Taken Time  BP 154/73 11/28/21 0932  Temp 36.6 C 11/28/21 0932  Pulse 85 11/28/21 0932  Resp 17 11/28/21 0932  SpO2 98 % 11/28/21 0932    Last Pain:  Vitals:   11/28/21 0506  TempSrc: Oral  PainSc:       Patients Stated Pain Goal: 0 (29/51/88 4166)  Complications: No notable events documented.

## 2021-11-28 NOTE — Clinical Social Work Note (Signed)
°  Transition of Care [facility]) Screening Note   Patient Details  Name: Miranda Mueller Date of Birth: [birth year]   Transition of Care [facility]) CM/SW Contact:    Trish Mage, LCSW Phone Number: 11/28/2021, 3:12 PM    Transition of Care Department [facility]) has reviewed patient and no TOC needs have been identified at this time. We will continue to monitor patient advancement through interdisciplinary progression rounds. If new patient transition needs arise, please place a TOC consult.

## 2021-11-28 NOTE — Op Note (Signed)
11/28/2021  PATIENT:  Miranda Mueller  81 y.o. female  Patient Care Team: Jearld Fenton, NP as PCP - General (Internal Medicine) Nicholas Lose, MD as Consulting Physician (Hematology and Oncology) Excell Seltzer, MD (Inactive) as Consulting Physician (General Surgery) Eppie Gibson, MD as Attending Physician (Radiation Oncology) Holley Bouche, NP (Inactive) as Nurse Practitioner (Nurse Practitioner)  PRE-OPERATIVE DIAGNOSIS:    Acute on Chronic Calculus Cholecystitis Gallstone pancreatitis  POST-OPERATIVE DIAGNOSIS:   Acute on Chronic Calculus Cholecystitis Gallstone pancreatitis Fatty steatohepatitis  PROCEDURE:  Core Liver Biopsy (CPT code 47001) & Laparoscopic cholecystectomy with intraoperative cholangiogram (CPT code [id number])  SURGEON:  Adin Hector, MD, FACS.  ASSISTANT: OR Staff   ANESTHESIA:    General with endotracheal intubation Local anesthetic as a field block  EBL:  (See Anesthesia Intraoperative Record) Total I/O In: 1100 [I.V.:1000; IV Piggyback:100] Out: 250 [Blood:250]   Delay start of Pharmacological VTE agent (>24hrs) due to surgical blood loss or risk of bleeding:  no  DRAINS: None   SPECIMEN: Gallbladder & Core liver biopsies    DISPOSITION OF SPECIMEN:  PATHOLOGY  COUNTS:  YES  PLAN OF CARE: Admit to inpatient   PATIENT DISPOSITION:  PACU - hemodynamically stable.  INDICATION: Pleasant woman with abdominal pain and elevated liver function test and gallstones.  Suspect gallstone pericarditis with choledocholithiasis.  Improvement in pain and liver numbers.  I recommended cholecystectomy with cholangiogram.  The anatomy & physiology of hepatobiliary & pancreatic function was discussed.  The pathophysiology of gallbladder dysfunction was discussed.  Natural history risks without surgery was discussed.   I feel the risks of no intervention will lead to serious problems that outweigh the operative risks; therefore, I recommended  cholecystectomy to remove the pathology.  I explained laparoscopic techniques with possible need for an open approach.  Probable cholangiogram to evaluate the bilary tract was explained as well.    Risks such as bleeding, infection, abscess, leak, injury to other organs, need for further treatment, heart attack, death, and other risks were discussed.  I noted a good likelihood this will help address the problem.  Possibility that this will not correct all abdominal symptoms was explained.  Goals of post-operative recovery were discussed as well.  We will work to minimize complications.  An educational handout further explaining the pathology and treatment options was given as well.  Questions were answered.  The patient expresses understanding & wishes to proceed with surgery.  OR FINDINGS: Dilated turgid gallbladder with edema and adhesions consistent with acute on chronic cholecystitis.  Dilated cystic duct.  Cholangiogram showing some thin sludge and possible mild sandy choledocholithiasis that easily flush with cholangiography.  No obstruction.  No leak or abscess.  No stricture.  Liver: Very friable and bleeding easily suspicious for some steatohepatitis.  No obvious macronodular cirrhosis.  Liver biopsies done.  DESCRIPTION:   Informed consent was confirmed.  The patient underwent general anaesthesia without difficulty.  The patient was positioned appropriately.  VTE prevention in place.  The patient's abdomen was clipped, prepped, & draped in a sterile fashion.  Surgical timeout confirmed our plan.  Peritoneal entry with a laparoscopic port was obtained using optical entry technique in the right upper abdomen as the patient was positioned in reverse Trendelenburg.  Entry was clean.  I induced carbon dioxide insufflation.  Camera inspection revealed no injury.  Extra ports were carefully placed under direct laparoscopic visualization.  I turned attention to the right upper quadrant.   Gallbladder was very distended and  turgid with some edema and jaundice.  I tried to grab it but it was too turgid to do so.  Therefore laparoscopically aspirated thick bile to better decompress the gallbladder.  Without I can better grasp the gallbladder fundus & elevate it cephalad.  It did have some edema and inflammation with may be some mild ischemia but there is no evidence of any gangrene phlegmon or empyema..  I used hook cautery to free the peritoneal coverings between the gallbladder and the liver on the posteriolateral and anteriomedial walls.   I used careful blunt and hook dissection to help get a good critical view of the cystic artery and cystic duct. I did further dissection to free 80% of the gallbladder off the liver bed to get a good critical view of the infundibulum and cystic duct.  I dissected out the cystic artery; and, after getting a good 360 view, ligated the anterior & posterior branches of the cystic artery close on the infundibulum using the focus cautery.  The gallbladder fossa liver bed was very friable and had some superficial venous branches with some brisk bleeding at first that was controlled with pressure and focused tip cautery.  Ended up freeing the gallbladder from its remaining attachments on the liver bed in a modified dome down fashion and then did meticulous cautery for hemostasis on the gallbladder fossa.  This just left the gallbladder hanging by the cystic duct only.  I skeletonized the cystic duct.  I placed a clip on the infundibulum. I did a partial cystic duct-otomy and ensured patency. I placed a 5 [place] cholangiocatheter through a puncture site at the right subcostal ridge of the abdominal wall and directed it into the cystic duct.  We ran a cholangiogram with dilute radio-opaque contrast and continuous fluoroscopy. Contrast flowed from a side branch consistent with cystic duct cannulization. . Contrast flowed down the common bile duct easily across the normal  ampulla into the duodenum.  There may have been some swirling hyperlucency and may be a few tiny dots suspicious for sludge/sandy choledocholithiasis.  However it was never really obstructing and flushed quite easily.  It flowed so well was hard to get her back pressure had going up to the common hepatic ducts.  I flushed and we ran another cholangiogram to confirm that contrast flowed up the common hepatic duct into the right and left intrahepatic chains out to secondary radicalsThis was consistent with a normal cholangiogram.  I removed the cholangiocatheter.  Because of the edema and dilated cystic duct I decided to ligated with a 0 PDS Endoloop I last doing around the gallbladder and coming down to the base of the cystic duct near the common bile duct junction.  Got excellent ligation there.  I placed clips on the cystic duct x4 towards the infundibulum.  I completed cystic duct transection.  Placed the gallbladder inside and EcoSac.  I then did copious irrigation and confirm meticulous hemostasis on the gallbladder fossa of the liver and elsewhere.  Because her friable liver with easy bleeding and decided to do core liver biopsies to make sure there is no evidence of any significant hepatitis or other issues.  Used a 14-gauge Tru-Cut needle through the subcostal cholangiogram puncture site and did 3 core passes on the anterior right hepatic lobe.  Got 2 excellent and 1 good core.  Assured hemostasis with spatula tip cautery.  I removed the gallbladder out the 10 mm subxiphoid port.  I did have to open up the fascia  1.5 cm total to get the gallbladder and its thickened wall and back out..  I placed #1 PDS interrupted stitches under laparoscopic guidance across the subxiphoid port site.  Did irrigation and reinspected confirming good hemostasis on the liver bed and needle puncture sites and elsewhere.  Completed 3 L of irrigation with good return.  Clips intact on the cystic duct stump.  No leak of bile.   Reassuring.  Confirmed good block on the right subcostal ridge and tap Leinbach on the right side and good aggressive field block around the subxiphoid port site.. I closed the skin using 4-0 monocryl stitch.  Sterile dressings were applied. The patient was extubated & arrived in the PACU in stable condition..  I had discussed postoperative care with the patient in the holding area. I discussed operative findings, updated the patient's status, discussed probable steps to recovery, and gave postoperative recommendations to the  patient's daughter, Joetta Manners .  Recommendations were made.  Questions were answered.  She expressed understanding & appreciation.  Adin Hector, M.D., F.A.C.S. Gastrointestinal and Minimally Invasive Surgery [facility] [address] [place], [place] [address] [phone] Main / Paging  11/28/2021 9:31 AM

## 2021-11-28 NOTE — Anesthesia Preprocedure Evaluation (Signed)
Anesthesia Evaluation  Patient identified by MRN, date of birth, ID band Patient awake    Reviewed: Allergy & Precautions, NPO status , Patient's Chart, lab work & pertinent test results  Airway Mallampati: IV  TM Distance: >3 FB Neck ROM: Full    Dental no notable dental hx. (+) Teeth Intact, Dental Advisory Given   Pulmonary sleep apnea (pt denies) ,  Snores at night, has never had sleep study    Pulmonary exam normal breath sounds clear to auscultation       Cardiovascular hypertension (167/59 in preop, per pt usually 120/60), Pt. on medications Normal cardiovascular exam Rhythm:Regular Rate:Normal  hasnt taken BP meds in a few days    Neuro/Psych negative neurological ROS  negative psych ROS   GI/Hepatic Neg liver ROS, GERD  Medicated and Controlled,  Endo/Other  Morbid obesityBMI 42  Renal/GU Renal diseaseCr 0.77  negative genitourinary   Musculoskeletal negative musculoskeletal ROS (+)   Abdominal   Peds negative pediatric ROS (+)  Hematology negative hematology ROS (+) hct 33.9   Anesthesia Other Findings   Reproductive/Obstetrics negative OB ROS                             Anesthesia Physical Anesthesia Plan  ASA: 3  Anesthesia Plan: General   Post-op Pain Management: Tylenol PO (pre-op)*   Induction: Intravenous  PONV Risk Score and Plan: 3 and Ondansetron, Dexamethasone, Midazolam and Treatment may vary due to age or medical condition  Airway Management Planned: Oral ETT  Additional Equipment: None  Intra-op Plan:   Post-operative Plan: Extubation in OR  Informed Consent: I have reviewed the patients History and Physical, chart, labs and discussed the procedure including the risks, benefits and alternatives for the proposed anesthesia with the patient or authorized representative who has indicated his/her understanding and acceptance.   Patient has DNR.   Discussed DNR with patient and Suspend DNR.   Dental advisory given  Plan Discussed with: CRNA  Anesthesia Plan Comments:         Anesthesia Quick Evaluation

## 2021-11-29 DIAGNOSIS — K851 Biliary acute pancreatitis without necrosis or infection: Secondary | ICD-10-CM | POA: Diagnosis not present

## 2021-11-29 DIAGNOSIS — E876 Hypokalemia: Secondary | ICD-10-CM | POA: Diagnosis not present

## 2021-11-29 DIAGNOSIS — Z853 Personal history of malignant neoplasm of breast: Secondary | ICD-10-CM | POA: Diagnosis not present

## 2021-11-29 LAB — COMPREHENSIVE METABOLIC PANEL
ALT: 126 U/L — ABNORMAL HIGH (ref 0–44)
AST: 111 U/L — ABNORMAL HIGH (ref 15–41)
Albumin: 3.3 g/dL — ABNORMAL LOW (ref 3.5–5.0)
Alkaline Phosphatase: 78 U/L (ref 38–126)
Anion gap: 9 (ref 5–15)
BUN: 10 mg/dL (ref 8–23)
CO2: 20 mmol/L — ABNORMAL LOW (ref 22–32)
Calcium: 8.4 mg/dL — ABNORMAL LOW (ref 8.9–10.3)
Chloride: 104 mmol/L (ref 98–111)
Creatinine, Ser: 0.78 mg/dL (ref 0.44–1.00)
GFR, Estimated: >60 mL/min (ref >60)
Glucose, Bld: 107 mg/dL — ABNORMAL HIGH (ref 70–99)
Potassium: 3.7 mmol/L (ref 3.5–5.1)
Sodium: 133 mmol/L — ABNORMAL LOW (ref 135–145)
Total Bilirubin: 1 mg/dL (ref 0.3–1.2)
Total Protein: 7.3 g/dL (ref 6.5–8.1)

## 2021-11-29 LAB — CBC
HCT: 33.9 % — ABNORMAL LOW (ref 36.0–46.0)
Hemoglobin: 11.5 g/dL — ABNORMAL LOW (ref 12.0–15.0)
MCH: 29.9 pg (ref 26.0–34.0)
MCHC: 33.9 g/dL (ref 30.0–36.0)
MCV: 88.3 fL (ref 80.0–100.0)
Platelets: 347 10*3/uL (ref 150–400)
RBC: 3.84 MIL/uL — ABNORMAL LOW (ref 3.87–5.11)
RDW: 13.7 % (ref 11.5–15.5)
WBC: 14.2 10*3/uL — ABNORMAL HIGH (ref 4.0–10.5)
nRBC: 0 % (ref 0.0–0.2)

## 2021-11-29 LAB — LIPASE, BLOOD: Lipase: 35 U/L (ref 11–51)

## 2021-11-29 MED ORDER — TRAMADOL HCL 50 MG PO TABS: 50.0000 mg | ORAL_TABLET | Freq: Four times a day (QID) | ORAL | 0 refills | Status: DC | PRN

## 2021-11-29 MED ORDER — POLYETHYLENE GLYCOL 3350 17 G PO PACK: 17.0000 g | PACK | Freq: Every day | ORAL | 0 refills | Status: DC | PRN

## 2021-11-29 MED ORDER — CALCIUM POLYCARBOPHIL 625 MG PO TABS: 625.0000 mg | ORAL_TABLET | Freq: Two times a day (BID) | ORAL | 0 refills | Status: AC

## 2021-11-29 NOTE — Discharge Summary (Signed)
Discharge Summary  Miranda Mueller UMP:[id number] DOB: 13-Feb-1941  PCP: Jearld Fenton, NP  Admit date: 11/25/2021 Discharge date: 11/29/2021  Time spent: 50mins  Recommendations for Outpatient Follow-up:  F/u with PCP within a week  for hospital discharge follow up, repeat cbc/cmp at follow up F/u with general surgery in three weeks F/u with Dr Lindi Adie for right breast fluids density seen on CT scan   Discharge Diagnoses:  Active Hospital Problems   Diagnosis Date Noted   Acute pancreatitis 11/26/2021   Gallstone pancreatitis 11/26/2021   Hypokalemia 11/26/2021   History of breast cancer 08/20/2014   Essential hypertension 11/23/2007    Resolved Hospital Problems  No resolved problems to display.    Discharge Condition: stable  Diet recommendation: low fat diet  Filed Weights   11/25/21 2149  Weight: 118.8 kg    History of present illness:  Chief Complaint: stomach pain, n/v   HPI: Miranda Mueller is a 81 y.o. female with medical history significant of remote h/o breast CA s/p lumpectomy and XRT in remission, h/o HTN, HLD, presented to drop her H ED due to abdominal pain nausea and vomiting.   ED Course: Blood pressure (!) 174/87, pulse 85, temperature 98.7 F (37.1 C), temperature source Oral, resp. rate 16, height 5\' 6"  (1.676 m), weight 118.8 kg, SpO2 98 %. Labs showed WBC 17.7, potassium 2.9, elevated lipase 1238, elevated AST and ALT, total bilirubin 1.3, triglyceride 85 High sensitive troponin negative EKG personally reviewed sinus rhythm ,no acute ST-T changes UA no sign of infection Nasal swab negative for flu , negative for COVID   CT ab/pel: 1. Acute pancreatitis.  No pseudocyst formation. 2. Cholelithiasis. 3. Hepatomegaly and hepatic steatosis. 4.  Aortic Atherosclerosis (ICD10-I70.0). 5. A right breast 4.9 x 2.9 cm fluid dense lesion with associated surgical clips. Correlate with mammography.   Ab [place]: Gallstones within the gallbladder measuring up  to 1.3 cm. No wall thickening.  Common bile duct: Diameter: Normal caliber, 4-5 mm Hepatic steatosis   She was given IV hydration, antiemetics, analgesics, she is transferred to [facility], she reports her pain has much improved, nausea and vomiting has resolved upon arrival to [facility], there is no fever    Hospital Course:  Principal Problem:   Acute pancreatitis Active Problems:   Essential hypertension   History of breast cancer   Gallstone pancreatitis   Hypokalemia   Assessment and Plan: No notes have been filed under this hospital service. Service: Hospitalist   Gallstone pancreatitis -on 2/17 s/p lap chole, IOC no obstruction, no leak, no stricture --she is doing well, she is cleared to d/c home by general surgery -appreciate general surgery input   Hypokalemia/hypomagnesemia Likely from poor oral intake due to gallstone pancreatitis  HCTZ held in the hospital K/ma replaced, oral intake has improved, resume HCTZ, f/u with pcp to repeat labs     Hypertension Hold Benicar perioperatively and in the setting of acute pancreatitis Hold HCTZ due to hypokalemia  Blood pressure stable so far without BP meds while in the hospital  resume Benicar and HCTZ at discharge, f/u with pcp   Hyperlipidemia Hold statin due to abnormal LFT,   resume statin at discharge F/u with pcp   A right breast 4.9 x 2.9 cm fluid dense lesion with associated surgical clips.  Seen on CT this time -She denies any discomfort, does not aware of any new lump on breast exam -H/o right breast CA s/p lumpectomy and radiation, in remission,  she was discharged from oncology clinic 5 years ago, last mammogram in June 2022 unremarkable, she reports have a mammogram scheduled in June this year -Case discussed with oncology Dr. Lindi Adie over the phone who will arrange outpatient mammogram after she is discharged from the hospital   Body mass index is 42.29 kg/m.  Meet class III obesity  criteria Denies OSA history    Discharge Exam: BP 125/73 (BP Location: Left Arm)    Pulse 65    Temp 97.8 F (36.6 C) (Oral)    Resp 18    Ht 5\' 6"  (1.676 m)    Wt 118.8 kg    SpO2 93%    BMI 42.29 kg/m   General: NAD, pleasant Cardiovascular: RRR Respiratory: normal respiratory effort     Discharge Instructions     Diet general   Complete by: As directed    Low fat diet   Discharge wound care:   Complete by: As directed    F/u with general surgery for Wound check   Increase activity slowly   Complete by: As directed       Allergies as of 11/29/2021   No Known Allergies      Medication List     TAKE these medications    acetaminophen 500 MG tablet Commonly known as: TYLENOL Take 500 mg by mouth every 6 (six) hours as needed.   aspirin 81 MG tablet Take 81 mg by mouth daily.   atorvastatin 10 MG tablet Commonly known as: LIPITOR TAKE 1 TABLET DAILY   betamethasone dipropionate 0.05 % cream APPLY TOPICALLY AS NEEDED   cycloSPORINE 0.05 % ophthalmic emulsion Commonly known as: RESTASIS Place 1 drop into both eyes 2 (two) times daily.   esomeprazole 40 MG capsule Commonly known as: NEXIUM TAKE 1 CAPSULE DAILY AT 12 NOON   fexofenadine 180 MG tablet Commonly known as: ALLEGRA Take 180 mg by mouth daily.   fluticasone 50 MCG/ACT nasal spray Commonly known as: FLONASE TAKE 2 SPRAYS INTO BOTH NOSTRILS DAILY   HAIR/SKIN/NAILS PO Take 1 tablet by mouth daily.   hydrochlorothiazide 25 MG tablet Commonly known as: HYDRODIURIL TAKE 1 TABLET DAILY   methocarbamol 500 MG tablet Commonly known as: ROBAXIN TAKE 1 TABLET DAILY AS NEEDED FOR MUSCLE SPASMS   olmesartan 20 MG tablet Commonly known as: BENICAR TAKE 1 TABLET DAILY   polycarbophil 625 MG tablet Commonly known as: FIBERCON Take 1 tablet (625 mg total) by mouth 2 (two) times daily. Start taking on: December 06, 2021   polyethylene glycol 17 g packet Commonly known as: MiraLax Take 17 g  by mouth daily as needed for moderate constipation.   Potassium 99 MG Tabs Take 1 tablet by mouth daily.   traMADol 50 MG tablet Commonly known as: ULTRAM Take 1-2 tablets (50-100 mg total) by mouth every 6 (six) hours as needed for moderate pain.   Zinc 50 MG Caps Take by mouth.               Discharge Care Instructions  (From admission, onward)           Start     Ordered   11/29/21 0000  Discharge wound care:       Comments: F/u with general surgery for Wound check   11/29/21 0938           No Known Allergies  Follow-up [facility] Follow up in 3 week(s).   Specialty: General Surgery Contact  information: [place] Garber, [place], NP Follow up in 1 week(s).   Specialties: Internal Medicine, Emergency Medicine Why: hospital discharge follow up, repeat basic lab work including cbc ( blood count), cmp ( electrolytes , liver and kidney function ). Contact information: [place] [address] [phone]         Nicholas Lose, MD Follow up in 3 week(s).   Specialty: Hematology and Oncology Why: for right breast fluids density seen on CT Contact information: [place] [facility] [place] [phone]                  The results of significant diagnostics from this hospitalization (including imaging, microbiology, ancillary and laboratory) are listed below for reference.    Significant Diagnostic Studies: DG Cholangiogram Operative  Result Date: 11/28/2021 CLINICAL DATA:  81 year old female with cholelithiasis EXAM: INTRAOPERATIVE CHOLANGIOGRAM TECHNIQUE: Cholangiographic images from the C-arm fluoroscopic device were submitted for interpretation post-operatively. Please see the procedural report for the amount of contrast and the fluoroscopy time utilized. FLUOROSCOPY: Radiation Exposure Index  (as provided by the fluoroscopic device): 13.6 mGy Kerma COMPARISON:  Ultrasound 11/26/2021 FINDINGS: Surgical instruments project over the upper abdomen. There is cannulation of the cystic duct/gallbladder neck, with antegrade infusion of contrast. Caliber of the extrahepatic ductal system within normal limits. No definite filling defect within the extrahepatic ducts identified. Free flow of contrast across the ampulla. IMPRESSION: Intraoperative cholangiogram demonstrates extrahepatic biliary ducts of unremarkable caliber, with no definite filling defects identified. Free flow of contrast across the ampulla. Please refer to the dictated operative report for full details of intraoperative findings and procedure Electronically Signed   By: Corrie Mckusick D.O.   On: 11/28/2021 09:33   CT ABDOMEN PELVIS W CONTRAST  Result Date: 11/26/2021 CLINICAL DATA:  Pancreatitis, acute, severe EXAM: CT ABDOMEN AND PELVIS WITH CONTRAST TECHNIQUE: Multidetector CT imaging of the abdomen and pelvis was performed using the standard protocol following bolus administration of intravenous contrast. RADIATION DOSE REDUCTION: This exam was performed according to the departmental dose-optimization program which includes automated exposure control, adjustment of the mA and/or kV according to patient size and/or use of iterative reconstruction technique. CONTRAST:  176mL OMNIPAQUE IOHEXOL 300 MG/ML  SOLN COMPARISON:  MRI breast 08/21/2014 FINDINGS: Lower chest: Coronary artery calcifications. No acute abnormality. Slightly patulous distal esophagus versus tiny hiatal hernia. Hepatobiliary: The liver is enlarged measuring up to 24 cm. The hepatic parenchyma is slightly diffusely hypodense compared to the splenic parenchyma consistent with fatty infiltration. No focal liver abnormality. Multiple calcified gallstone within the gallbladder lumen. No gallbladder wall thickening or pericholecystic fluid. No biliary dilatation. No CT findings  suggest choledocholithiasis. Pancreas: Diffusely atrophic. No focal lesion. Diffuse peripancreatic fat stranding and trace free fluid. No main pancreatic ductal dilatation. No pseudocyst formation. Spleen: Normal in size without focal abnormality. Adrenals/Urinary Tract: No adrenal nodule bilaterally. Bilateral kidneys enhance symmetrically. No hydronephrosis. No hydroureter. The urinary bladder is unremarkable. On delayed imaging, there is no urothelial wall thickening and there are no filling defects in the opacified portions of the bilateral collecting systems or ureters. Stomach/Bowel: Stomach is within normal limits. No evidence of bowel wall thickening or dilatation. Scattered colonic diverticulosis. Appendix appears normal. Vascular/Lymphatic: No abdominal aorta or iliac aneurysm. Moderate to severe atherosclerotic plaque of the aorta and its branches. No abdominal, pelvic, or inguinal lymphadenopathy. Reproductive: Uterus and bilateral adnexa are unremarkable.  Other: No intraperitoneal free fluid. No intraperitoneal free gas. No organized fluid collection. Musculoskeletal: No abdominal wall hernia or abnormality. There is a right breast 4.9 x 2.9 cm fluid dense lesion with associated surgical clips. No suspicious lytic or blastic osseous lesions. No acute displaced fracture. Multilevel degenerative changes of the spine with intervertebral disc space vacuum phenomenon at the L1 through L5 levels. IMPRESSION: 1. Acute pancreatitis.  No pseudocyst formation. 2. Cholelithiasis. 3. Hepatomegaly and hepatic steatosis. 4.  Aortic Atherosclerosis (ICD10-I70.0). 5. A right breast 4.9 x 2.9 cm fluid dense lesion with associated surgical clips. Correlate with mammography. Electronically Signed   By: Iven Finn M.D.   On: 11/26/2021 00:38   US Abdomen Limited RUQ (LIVER/GB)  Result Date: 11/26/2021 CLINICAL DATA:  Right upper quadrant pain, vomiting EXAM: ULTRASOUND ABDOMEN LIMITED RIGHT UPPER QUADRANT  COMPARISON:  None. FINDINGS: Gallbladder: Gallstones within the gallbladder measuring up to 1.3 cm. No wall thickening. Common bile duct: Diameter: Normal caliber, 4-5 mm Liver: Mildly increased echotexture throughout the liver suggesting fatty infiltration. No focal hepatic abnormality. Portal vein is patent on color Doppler imaging with normal direction of blood flow towards the liver. Other: None. IMPRESSION: Cholelithiasis. Hepatic steatosis. Electronically Signed   By: Rolm Baptise M.D.   On: 11/26/2021 01:13    Microbiology: Recent Results (from the past 240 hour(s))  Resp Panel by RT-PCR (Flu A&B, Covid) Nasopharyngeal Swab     Status: None   Collection Time: 11/26/21  1:22 AM   Specimen: Nasopharyngeal Swab; Nasopharyngeal(NP) swabs in vial transport medium  Result Value Ref Range Status   SARS Coronavirus 2 by RT PCR NEGATIVE NEGATIVE Final    Comment: (NOTE) SARS-CoV-2 target nucleic acids are NOT DETECTED.  The SARS-CoV-2 RNA is generally detectable in upper respiratory specimens during the acute phase of infection. The lowest concentration of SARS-CoV-2 viral copies this assay can detect is 138 copies/mL. A negative result does not preclude SARS-Cov-2 infection and should not be used as the sole basis for treatment or other patient management decisions. A negative result may occur with  improper specimen collection/handling, submission of specimen other than nasopharyngeal swab, presence of viral mutation(s) within the areas targeted by this assay, and inadequate number of viral copies(<138 copies/mL). A negative result must be combined with clinical observations, patient history, and epidemiological information. The expected result is Negative.  Fact Sheet for Patients:  EntrepreneurPulse.com.au  Fact Sheet for Healthcare Providers:  IncredibleEmployment.be  This test is no t yet approved or cleared by the [place] FDA and  has been  authorized for detection and/or diagnosis of SARS-CoV-2 by FDA under an Emergency Use Authorization (EUA). This EUA will remain  in effect (meaning this test can be used) for the duration of the COVID-19 declaration under Section 564(b)(1) of the Act, 21 U.S.C.section 360bbb-3(b)(1), unless the authorization is terminated  or revoked sooner.       Influenza A by PCR NEGATIVE NEGATIVE Final   Influenza B by PCR NEGATIVE NEGATIVE Final    Comment: (NOTE) The Xpert Xpress SARS-CoV-2/FLU/RSV plus assay is intended as an aid in the diagnosis of influenza from Nasopharyngeal swab specimens and should not be used as a sole basis for treatment. Nasal washings and aspirates are unacceptable for Xpert Xpress SARS-CoV-2/FLU/RSV testing.  Fact Sheet for Patients: EntrepreneurPulse.com.au  Fact Sheet for Healthcare Providers: IncredibleEmployment.be  This test is not yet approved or cleared by the [place] FDA and has been authorized for detection and/or diagnosis of SARS-CoV-2 by FDA  under an Emergency Use Authorization (EUA). This EUA will remain in effect (meaning this test can be used) for the duration of the COVID-19 declaration under Section 564(b)(1) of the Act, 21 U.S.C. section 360bbb-3(b)(1), unless the authorization is terminated or revoked.  Performed at [facility], [address] [place], [place] [address]      Labs: Basic Metabolic Panel: Recent Labs  Lab 11/25/21 2209 11/26/21 1736 11/27/21 0446 11/28/21 0509 11/29/21 0732  NA 135 136 137 135 133*  K 2.9* 3.2* 2.8* 3.3* 3.7  CL 99 103 107 107 104  CO2 21* 25 24 20* 20*  GLUCOSE 138* 100* 95 133* 107*  BUN 17 16 13 10 10   CREATININE 0.85 0.77 0.70 0.77 0.78  CALCIUM 9.3 8.3* 7.9* 8.3* 8.4*  MG  --  1.8 1.8 2.1  --    Liver Function Tests: Recent Labs  Lab 11/25/21 2209 11/26/21 1736 11/27/21 0446 11/28/21 0509 11/29/21 0732  AST 143* 91*  58* 43* 111*  ALT 122* 134* 97* 76* 126*  ALKPHOS 64 69 67 77 78  BILITOT 1.3* 3.7* 3.3* 2.3* 1.0  PROT 7.4 7.1 6.1* 6.6 7.3  ALBUMIN 4.0 3.3* 3.1* 3.2* 3.3*   Recent Labs  Lab 11/25/21 2209 11/26/21 1736 11/27/21 0446 11/28/21 0509 11/29/21 0732  LIPASE 1,238* 1,069* 496* 183* 35   No results for input(s): AMMONIA in the last 168 hours. CBC: Recent Labs  Lab 11/25/21 2209 11/27/21 0446 11/28/21 0509 11/29/21 0732  WBC 17.7* 10.7* 7.2 14.2*  HGB 13.4 10.9* 11.1* 11.5*  HCT 40.3 32.1* 33.9* 33.9*  MCV 87.8 87.7 88.5 88.3  PLT 421* 276 273 347   Cardiac Enzymes: No results for input(s): CKTOTAL, CKMB, CKMBINDEX, TROPONINI in the last 168 hours. BNP: BNP (last 3 results) No results for input(s): BNP in the last 8760 hours.  ProBNP (last 3 results) No results for input(s): PROBNP in the last 8760 hours.  CBG: No results for input(s): GLUCAP in the last 168 hours.  FURTHER DISCHARGE INSTRUCTIONS:   Get Medicines reviewed and adjusted: Please take all your medications with you for your next visit with your Primary MD   Laboratory/radiological data: Please request your Primary MD to go over all hospital tests and procedure/radiological results at the follow up, please ask your Primary MD to get all Hospital records sent to his/her office.   In some cases, they will be blood work, cultures and biopsy results pending at the time of your discharge. Please request that your primary care M.D. goes through all the records of your hospital data and follows up on these results.   Also Note the following: If you experience worsening of your admission symptoms, develop shortness of breath, life threatening emergency, suicidal or homicidal thoughts you must seek medical attention immediately by calling 911 or calling your MD immediately  if symptoms less severe.   You must read complete instructions/literature along with all the possible adverse reactions/side effects for all the  Medicines you take and that have been prescribed to you. Take any new Medicines after you have completely understood and accpet all the possible adverse reactions/side effects.    Do not drive when taking Pain medications or sleeping medications (Benzodaizepines)   Do not take more than prescribed Pain, Sleep and Anxiety Medications. It is not advisable to combine anxiety,sleep and pain medications without talking with your primary care practitioner   Special Instructions: If you have smoked or chewed Tobacco  in the last 2 yrs  please stop smoking, stop any regular Alcohol  and or any Recreational drug use.   Wear Seat belts while driving.   Please note: You were cared for by a hospitalist during your hospital stay. Once you are discharged, your primary care physician will handle any further medical issues. Please note that NO REFILLS for any discharge medications will be authorized once you are discharged, as it is imperative that you return to your primary care physician (or establish a relationship with a primary care physician if you do not have one) for your post hospital discharge needs so that they can reassess your need for medications and monitor your lab values.     Signed:  Florencia Reasons MD, PhD, FACP  [facility] 11/29/2021, 9:51 AM

## 2021-11-29 NOTE — Progress Notes (Signed)
1 Day Post-Op   Chief Complaint/Subjective: Tolerated grits. Some occasional sharp pains  Objective: Vital signs in last 24 hours: Temp:  [97.8 F (36.6 C)-98.6 F (37 C)] 97.8 F (36.6 C) (02/18 0556) Pulse Rate:  [65-88] 65 (02/18 0556) Resp:  [16-20] 18 (02/18 0556) BP: (125-157)/(61-78) 125/73 (02/18 0556) SpO2:  [90 %-98 %] 93 % (02/18 0556) Last BM Date : 11/25/21 Intake/Output from previous day: 02/17 0701 - 02/18 0700 In: 1940 [P.O.:740; I.V.:1100; IV Piggyback:100] Out: 250 [Blood:250] Intake/Output this shift: Total I/O In: 480 [P.O.:480] Out: -   PE: Gen: NAd Resp: nonlabored Card: RRR Abd: soft, appropriately tender, incisions intact and dry  Lab Results:  Recent Labs    11/27/21 0446 11/28/21 0509  WBC 10.7* 7.2  HGB 10.9* 11.1*  HCT 32.1* 33.9*  PLT 276 273   BMET Recent Labs    11/27/21 0446 11/28/21 0509  NA 137 135  K 2.8* 3.3*  CL 107 107  CO2 24 20*  GLUCOSE 95 133*  BUN 13 10  CREATININE 0.70 0.77  CALCIUM 7.9* 8.3*   PT/INR Recent Labs    11/27/21 0446  LABPROT 15.0  INR 1.2   CMP     Component Value Date/Time   NA 135 11/28/2021 0509   NA 138 08/22/2014 1237   K 3.3 (L) 11/28/2021 0509   K 3.8 08/22/2014 1237   CL 107 11/28/2021 0509   CO2 20 (L) 11/28/2021 0509   CO2 28 08/22/2014 1237   GLUCOSE 133 (H) 11/28/2021 0509   GLUCOSE 102 08/22/2014 1237   BUN 10 11/28/2021 0509   BUN 16.0 08/22/2014 1237   CREATININE 0.77 11/28/2021 0509   CREATININE 0.93 07/03/2021 1037   CREATININE 1.1 08/22/2014 1237   CALCIUM 8.3 (L) 11/28/2021 0509   CALCIUM 9.8 08/22/2014 1237   PROT 6.6 11/28/2021 0509   PROT 7.6 08/22/2014 1237   ALBUMIN 3.2 (L) 11/28/2021 0509   ALBUMIN 3.9 08/22/2014 1237   AST 43 (H) 11/28/2021 0509   AST 17 08/22/2014 1237   ALT 76 (H) 11/28/2021 0509   ALT 22 08/22/2014 1237   ALKPHOS 77 11/28/2021 0509   ALKPHOS 50 08/22/2014 1237   BILITOT 2.3 (H) 11/28/2021 0509   BILITOT 0.42 08/22/2014  1237   GFRNONAA >60 11/28/2021 0509   GFRAA 72 12/01/2007 1105   Lipase     Component Value Date/Time   LIPASE 183 (H) 11/28/2021 0509    Studies/Results: DG Cholangiogram Operative  Result Date: 11/28/2021 CLINICAL DATA:  81 year old female with cholelithiasis EXAM: INTRAOPERATIVE CHOLANGIOGRAM TECHNIQUE: Cholangiographic images from the C-arm fluoroscopic device were submitted for interpretation post-operatively. Please see the procedural report for the amount of contrast and the fluoroscopy time utilized. FLUOROSCOPY: Radiation Exposure Index (as provided by the fluoroscopic device): 13.6 mGy Kerma COMPARISON:  Ultrasound 11/26/2021 FINDINGS: Surgical instruments project over the upper abdomen. There is cannulation of the cystic duct/gallbladder neck, with antegrade infusion of contrast. Caliber of the extrahepatic ductal system within normal limits. No definite filling defect within the extrahepatic ducts identified. Free flow of contrast across the ampulla. IMPRESSION: Intraoperative cholangiogram demonstrates extrahepatic biliary ducts of unremarkable caliber, with no definite filling defects identified. Free flow of contrast across the ampulla. Please refer to the dictated operative report for full details of intraoperative findings and procedure Electronically Signed   By: Corrie Mckusick D.O.   On: 11/28/2021 09:33    Anti-infectives: Anti-infectives (From admission, onward)    Start     Dose/Rate Route  Frequency Ordered Stop   11/28/21 0600  cefTRIAXone (ROCEPHIN) 2 g in sodium chloride 0.9 % 100 mL IVPB        2 g 200 mL/hr over 30 Minutes Intravenous On call to O.R. 11/27/21 0955 11/28/21 0754       Assessment/Plan  s/p Procedure(s): LAPAROSCOPIC CHOLECYSTECTOMY WITH INTRAOPERATIVE CHOLANGIOGRAM, LIVER BIOPSY 11/28/2021   FEN - advance to soft VTE - per primary ID - no antibiotic needed Disposition - ok to discharge today per surgical team, pain Rx completed   LOS: 3 days    I reviewed last 24 h vitals and pain scores, last 48 h intake and output, last 24 h labs and trends, and last 24 h imaging results.  This care required moderate level of medical decision making.   [facility] 11/29/2021, 8:25 AM Please see Amion for pager number during day hours 7:00am-4:30pm or 7:00am -11:30am on weekends

## 2021-12-01 ENCOUNTER — Encounter (HOSPITAL_COMMUNITY): Payer: Self-pay | Admitting: Surgery

## 2021-12-01 ENCOUNTER — Telehealth: Payer: Self-pay

## 2021-12-01 LAB — SURGICAL PATHOLOGY

## 2021-12-01 NOTE — Telephone Encounter (Signed)
Transition Care Management Follow-up Telephone Call Date of discharge and from where: 11/29/21 Miranda Mueller How have you been since you were released from the hospital? Pt states she is doing okay but things are slow going. She is concerned that she has not had a bowel movement yet but has taken 2 doses of miralax Any questions or concerns? No  Items Reviewed: Did the pt receive and understand the discharge instructions provided? Yes  Medications obtained and verified? Yes  Other? No  Any new allergies since your discharge? No  Dietary orders reviewed? Yes Do you have support at home? Yes   Home Care and Equipment/Supplies: Were home health services ordered? no  Were any new equipment or medical supplies ordered?  No   Functional Questionnaire: (I = Independent and D = Dependent) ADLs: I  Bathing/Dressing- I  Meal Prep- I  Eating- I  Maintaining continence- I  Transferring/Ambulation- I  Managing Meds- I  Follow up appointments reviewed:  PCP Hospital f/u appt confirmed? Yes  Scheduled to see Webb Silversmith NP on 12/05/21 @ 3:00. [facility] f/u appt confirmed? Yes  Scheduled to see [facility] on 12/18/21. Are transportation arrangements needed? No  If their condition worsens, is the pt aware to call PCP or go to the Emergency Dept.? Yes Was the patient provided with contact information for the PCP's office or ED? Yes Was to pt encouraged to call back with questions or concerns? Yes

## 2021-12-02 NOTE — Telephone Encounter (Signed)
Will discuss at upcoming appt.

## 2021-12-03 ENCOUNTER — Telehealth: Payer: Self-pay | Admitting: Hematology and Oncology

## 2021-12-03 NOTE — Telephone Encounter (Signed)
Called patient to schedule appointment per 2/22 inbasket, patient is aware of date and time.  Patient said she was not ready to come in so soon after recent procedure, requested to come at the end of March.

## 2021-12-05 ENCOUNTER — Other Ambulatory Visit: Payer: Self-pay

## 2021-12-05 ENCOUNTER — Ambulatory Visit (INDEPENDENT_AMBULATORY_CARE_PROVIDER_SITE_OTHER): Payer: Medicare Other | Admitting: Internal Medicine

## 2021-12-05 ENCOUNTER — Encounter: Payer: Self-pay | Admitting: Internal Medicine

## 2021-12-05 VITALS — BP 121/70 | HR 90 | Temp 97.1°F | Wt 248.0 lb

## 2021-12-05 DIAGNOSIS — K851 Biliary acute pancreatitis without necrosis or infection: Secondary | ICD-10-CM | POA: Diagnosis not present

## 2021-12-05 DIAGNOSIS — K8080 Other cholelithiasis without obstruction: Secondary | ICD-10-CM

## 2021-12-05 NOTE — Patient Instructions (Signed)
Acute Pancreatitis Acute pancreatitis happens when the pancreas gets swollen. The pancreas is a large gland in the body that helps to control blood sugar. It also makes enzymes that help to digest food. This condition can last a few days and cause serious problems. The lungs, heart, and kidneys may stop working. What are the causes? Causes include: Alcohol abuse. Drug abuse. Gallstones. A tumor in the pancreas. Other causes include: Some medicines. Some chemicals. Diabetes. An infection. Damage caused by an accident. The poison (venom) from a scorpion bite. Belly (abdominal) surgery. The body's defense system (immune system) attacking the pancreas (autoimmune pancreatitis). Genes that are passed from parent to child (inherited). In some cases, the cause is not known. What are the signs or symptoms? Pain in the upper belly that may be felt in the back. The pain may be very bad. Swelling of the belly. Feeling sick to your stomach (nauseous) and throwing up (vomiting). Fever. How is this treated? You will likely have to stay in the hospital. Treatment may include: Pain medicine. Fluid through an IV tube. Placing a tube in the stomach to take out the stomach contents. This may help you stop throwing up. Not eating for 3-4 days. Antibiotic medicines, if you have an infection. Treating any other problems that may be the cause. Steroid medicines, if your problem is caused by your defense system attacking your body's own tissues. Surgery. Follow these instructions at home: Eating and drinking  Follow instructions from your doctor about what to eat and drink. Eat foods that do not have a lot of fat in them. Eat small meals often. Do not eat big meals. Drink enough fluid to keep your pee (urine) pale yellow. Do not drink alcohol if it caused your condition. Medicines Take over-the-counter and prescription medicines only as told by your doctor. Ask your doctor if the medicine  prescribed to you: Requires you to avoid driving or using heavy machinery. Can cause trouble pooping (constipation). You may need to take steps to prevent or treat trouble pooping: Take over-the-counter or prescription medicines. Eat foods that are high in fiber. These include beans, whole grains, and fresh fruits and vegetables. Limit foods that are high in fat and sugar. These include fried or sweet foods. General instructions Do not use any products that contain nicotine or tobacco, such as cigarettes, e-cigarettes, and chewing tobacco. If you need help quitting, ask your doctor. Get plenty of rest. Check your blood sugar at home as told by your doctor. Keep all follow-up visits as told by your doctor. This is important. Contact a doctor if: You do not get better as quickly as expected. You have new symptoms. Your symptoms get worse. You have pain or weakness that lasts a long time. You keep feeling sick to your stomach. You get better and then you have pain again. You have a fever. Get help right away if: You cannot eat or keep fluids down. Your pain gets very bad. Your skin or the white part of your eyes turns yellow. You have sudden swelling in your belly. You throw up. You feel dizzy or you pass out (faint). Your blood sugar is high (over 300 mg/dL). Summary Acute pancreatitis happens when the pancreas gets swollen. This condition is often caused by alcohol abuse, drug abuse, or gallstones. You will likely have to stay in the hospital for treatment. This information is not intended to replace advice given to you by your health care provider. Make sure you discuss any questions you  have with your health care provider. Document Revised: 07/18/2018 Document Reviewed: 07/18/2018 Elsevier Patient Education  2022 [employer].

## 2021-12-05 NOTE — Progress Notes (Signed)
Subjective:    Patient ID: Miranda Mueller, female    DOB: December 03, 1940, 81 y.o.   MRN: [id number]  HPI  Patient presents to clinic today for TCM hospital follow-up.  She presented to the ER 2/14 with complaint of abdominal pain, nausea and vomiting.  CT abdomen pelvis showed cholelithiasis without cholecystitis, acute pancreatitis without pseudocyst, hepatomegaly with hepatic steatosis and a fluid buildup within the right breast.  She was treated with IV fluids, antiemetics and pain medications.  She was transferred to [facility] where she had a cholecystectomy, and she was eventually discharged 2/18.  She was advised to follow-up with her PCP in 1 week.  Since discharge, she reports she continues to have abdominal pain, in her right flank. She describes this pain as shooting pain. It is intermittent. She denies nausea, vomiting, diarrhea or blood in her stools. She was prescribed Tramadol but has not been taking this much. She has been taking Ibuprofen and Tylenol as well. She has an appt with gen surgery 12/18/21 and an appt with oncology at the end of March.  Review of Systems     Past Medical History:  Diagnosis Date   Bradycardia    Breast cancer ([place]) 08/29/14   Right Breast -High Grade Invasive Mammary   Cataract    surgery   GERD (gastroesophageal reflux disease)    History of shingles    Hx of radiation therapy 10/01/14- 11/20/14   right breast/50.4 Gy/28 fx; right breast boost/10 Gy/5 fx   Hyperlipidemia    Hypertension    IBS (irritable bowel syndrome)    Multifocal PVCs    PVC (premature ventricular contraction)    Hx of   Sleep apnea    no cpap per pt     Current Outpatient Medications  Medication Sig Dispense Refill   acetaminophen (TYLENOL) 500 MG tablet Take 500 mg by mouth every 6 (six) hours as needed.     aspirin 81 MG tablet Take 81 mg by mouth daily.     atorvastatin (LIPITOR) 10 MG tablet TAKE 1 TABLET DAILY 90 tablet 3   betamethasone dipropionate 0.05  % cream APPLY TOPICALLY AS NEEDED 30 g 11   cycloSPORINE (RESTASIS) 0.05 % ophthalmic emulsion Place 1 drop into both eyes 2 (two) times daily.       esomeprazole (NEXIUM) 40 MG capsule TAKE 1 CAPSULE DAILY AT 12 NOON 90 capsule 2   fexofenadine (ALLEGRA) 180 MG tablet Take 180 mg by mouth daily.     fluticasone (FLONASE) 50 MCG/ACT nasal spray TAKE 2 SPRAYS INTO BOTH NOSTRILS DAILY 16 g 4   hydrochlorothiazide (HYDRODIURIL) 25 MG tablet TAKE 1 TABLET DAILY 90 tablet 2   methocarbamol (ROBAXIN) 500 MG tablet TAKE 1 TABLET DAILY AS NEEDED FOR MUSCLE SPASMS 30 tablet 11   Multiple Vitamins-Minerals (HAIR/SKIN/NAILS PO) Take 1 tablet by mouth daily.     olmesartan (BENICAR) 20 MG tablet TAKE 1 TABLET DAILY 90 tablet 1   [START ON 12/06/2021] polycarbophil (FIBERCON) 625 MG tablet Take 1 tablet (625 mg total) by mouth 2 (two) times daily. 60 tablet 0   polyethylene glycol (MIRALAX) 17 g packet Take 17 g by mouth daily as needed for moderate constipation. 14 each 0   Potassium 99 MG TABS Take 1 tablet by mouth daily.       traMADol (ULTRAM) 50 MG tablet Take 1-2 tablets (50-100 mg total) by mouth every 6 (six) hours as needed for moderate pain. 20 tablet 0  Zinc 50 MG CAPS Take by mouth.     No current facility-administered medications for this visit.    No Known Allergies  Family History  Problem Relation Age of Onset   Hypertension Mother    Heart disease Mother        CAD   Breast cancer Mother 19   Hypertension Sister    Breast cancer Sister 69   Aneurysm Sister    Hypertension Brother    Prostate cancer Brother 13   Leukemia Maternal Uncle 40   Heart disease Maternal Grandmother    Leukemia Maternal Uncle 66   Breast cancer Cousin        dx under 61   Colon cancer Cousin        dx in her 43s   Colon cancer Cousin    Lymphoma Other 15       NHL   Colon polyps Daughter    Stroke Father    Esophageal cancer Neg Hx    Stomach cancer Neg Hx    Rectal cancer Neg Hx      Social History   Socioeconomic History   Marital status: Married    Spouse name: Homer   Number of children: 1   Years of education: 12   Highest education level: Not on file  Occupational History    Comment: retired  Tobacco Use   Smoking status: Never   Smokeless tobacco: Never  Vaping Use   Vaping Use: Never used  Substance and Sexual Activity   Alcohol use: No    Alcohol/week: 0.0 standard drinks   Drug use: No   Sexual activity: Never  Other Topics Concern   Not on file  Social History Narrative   Consumes one glass of caffeine daily   Social Determinants of Health   Financial Resource Strain: Not on file  Food Insecurity: Not on file  Transportation Needs: Not on file  Physical Activity: Not on file  Stress: Not on file  Social Connections: Not on file  Intimate Partner Violence: Not on file     Constitutional: Denies fever, malaise, fatigue, headache or abrupt weight changes.  Respiratory: Denies difficulty breathing, shortness of breath, cough or sputum production.   Cardiovascular: Denies chest pain, chest tightness, palpitations or swelling in the hands or feet.  Gastrointestinal: Pt reports abdominal pain. Denies bloating, constipation, diarrhea or blood in the stool.  GU: Denies urgency, frequency, pain with urination, burning sensation, blood in urine, odor or discharge. Skin: Pt reports multiple lap sites. Denies redness, rashes, lesions or ulcercations.    No other specific complaints in a complete review of systems (except as listed in HPI above).  Objective:   Physical Exam  BP 121/70 (BP Location: Left Wrist, Patient Position: Sitting, Cuff Size: Normal)    Pulse 90    Temp (!) 97.1 F (36.2 C) (Temporal)    Wt 248 lb (112.5 kg)    SpO2 98%    BMI 40.03 kg/m   Wt Readings from Last 3 Encounters:  11/25/21 262 lb (118.8 kg)  07/03/21 262 lb 9.6 oz (119.1 kg)  07/02/20 260 lb (117.9 kg)    General: Appears her stated age, obese, in  NAD. Skin: 5 lap sites intact without signs of infection. Cardiovascular: Normal rate and rhythm. S1,S2 noted.  No murmur, rubs or gallops noted.  Pulmonary/Chest: Normal effort and positive vesicular breath sounds. No respiratory distress. No wheezes, rales or ronchi noted.  Abdomen: Soft and nontender. Normal bowel sounds.  Musculoskeletal: Gait slow and steady with use of cane. Neurological: Alert and oriented.     BMET    Component Value Date/Time   NA 133 (L) 11/29/2021 0732   NA 138 08/22/2014 1237   K 3.7 11/29/2021 0732   K 3.8 08/22/2014 1237   CL 104 11/29/2021 0732   CO2 20 (L) 11/29/2021 0732   CO2 28 08/22/2014 1237   GLUCOSE 107 (H) 11/29/2021 0732   GLUCOSE 102 08/22/2014 1237   BUN 10 11/29/2021 0732   BUN 16.0 08/22/2014 1237   CREATININE 0.78 11/29/2021 0732   CREATININE 0.93 07/03/2021 1037   CREATININE 1.1 08/22/2014 1237   CALCIUM 8.4 (L) 11/29/2021 0732   CALCIUM 9.8 08/22/2014 1237   GFRNONAA >60 11/29/2021 0732   GFRAA 72 12/01/2007 1105    Lipid Panel     Component Value Date/Time   CHOL 146 07/03/2021 1037   TRIG 85 11/25/2021 0013   HDL 45 (L) 07/03/2021 1037   CHOLHDL 3.2 07/03/2021 1037   VLDL 29.8 07/02/2020 1009   LDLCALC 76 07/03/2021 1037    CBC    Component Value Date/Time   WBC 14.2 (H) 11/29/2021 0732   RBC 3.84 (L) 11/29/2021 0732   HGB 11.5 (L) 11/29/2021 0732   HGB 13.9 08/22/2014 1237   HCT 33.9 (L) 11/29/2021 0732   HCT 42.7 08/22/2014 1237   PLT 347 11/29/2021 0732   PLT 345 08/22/2014 1237   MCV 88.3 11/29/2021 0732   MCV 88.7 08/22/2014 1237   MCH 29.9 11/29/2021 0732   MCHC 33.9 11/29/2021 0732   RDW 13.7 11/29/2021 0732   RDW 13.8 08/22/2014 1237   LYMPHSABS 2.9 08/22/2014 1237   MONOABS 0.8 08/22/2014 1237   EOSABS 0.4 08/22/2014 1237   BASOSABS 0.1 08/22/2014 1237    Hgb A1C Lab Results  Component Value Date   HGBA1C 5.6 07/03/2021           Assessment & Plan:   [facility] follow-up for  Acute Pancreatitis, Cholelithiasis:  Hospital notes, labs and imaging reviewed CBC. CMET and lipase today She will follow-up with Dr. Lindi Adie regarding her right breast fluid collection She will follow-up with general surgery for postsurgical evaluation Advised her not to take ibuprofen and Tylenol at this time until labs are reviewed Advised her to take the tramadol 50 mg as prescribed  We will follow-up after labs with further recommendation and treatment plan Webb Silversmith, NP This visit occurred during the SARS-CoV-2 public health emergency.  Safety protocols were in place, including screening questions prior to the visit, additional usage of staff PPE, and extensive cleaning of exam room while observing appropriate contact time as indicated for disinfecting solutions.

## 2021-12-06 LAB — CBC
HCT: 37.9 % (ref 35.0–45.0)
Hemoglobin: 12.7 g/dL (ref 11.7–15.5)
MCH: 28.6 pg (ref 27.0–33.0)
MCHC: 33.5 g/dL (ref 32.0–36.0)
MCV: 85.4 fL (ref 80.0–100.0)
MPV: 10.2 fL (ref 7.5–12.5)
Platelets: 632 10*3/uL — ABNORMAL HIGH (ref 140–400)
RBC: 4.44 10*6/uL (ref 3.80–5.10)
RDW: 12.5 % (ref 11.0–15.0)
WBC: 11.9 10*3/uL — ABNORMAL HIGH (ref 3.8–10.8)

## 2021-12-06 LAB — COMPLETE METABOLIC PANEL WITH GFR
AG Ratio: 1.1 (calc) (ref 1.0–2.5)
ALT: 58 U/L — ABNORMAL HIGH (ref 6–29)
AST: 38 U/L — ABNORMAL HIGH (ref 10–35)
Albumin: 3.9 g/dL (ref 3.6–5.1)
Alkaline phosphatase (APISO): 74 U/L (ref 37–153)
BUN/Creatinine Ratio: 19 (calc) (ref 6–22)
BUN: 21 mg/dL (ref 7–25)
CO2: 26 mmol/L (ref 20–32)
Calcium: 9.7 mg/dL (ref 8.6–10.4)
Chloride: 95 mmol/L — ABNORMAL LOW (ref 98–110)
Creat: 1.09 mg/dL — ABNORMAL HIGH (ref 0.60–1.00)
Globulin: 3.7 g/dL (calc) (ref 1.9–3.7)
Glucose, Bld: 104 mg/dL (ref 65–139)
Potassium: 3.8 mmol/L (ref 3.5–5.3)
Sodium: 133 mmol/L — ABNORMAL LOW (ref 135–146)
Total Bilirubin: 0.8 mg/dL (ref 0.2–1.2)
Total Protein: 7.6 g/dL (ref 6.1–8.1)
eGFR: 52 mL/min/{1.73_m2} — ABNORMAL LOW (ref >=60)

## 2021-12-06 LAB — LIPASE: Lipase: 47 U/L (ref 7–60)

## 2021-12-08 NOTE — Anesthesia Postprocedure Evaluation (Signed)
Anesthesia Post Note  Patient: KALIYA SHREINER  Procedure(s) Performed: LAPAROSCOPIC CHOLECYSTECTOMY WITH INTRAOPERATIVE CHOLANGIOGRAM, LIVER BIOPSY     Patient location during evaluation: PACU Anesthesia Type: General Level of consciousness: patient cooperative and awake Pain management: pain level controlled Vital Signs Assessment: post-procedure vital signs reviewed and stable Respiratory status: spontaneous breathing, nonlabored ventilation, respiratory function stable and patient connected to nasal cannula oxygen Cardiovascular status: blood pressure returned to baseline and stable Postop Assessment: no apparent nausea or vomiting Anesthetic complications: no   No notable events documented.  Last Vitals:  Vitals:   11/29/21 0556 11/29/21 1039  BP: 125/73 (!) 168/92  Pulse: 65 75  Resp: 18 17  Temp: 36.6 C 36.9 C  SpO2: 93% 94%    Last Pain:  Vitals:   11/29/21 1039  TempSrc: Oral  PainSc: 0-No pain                 Sherina Stammer

## 2022-01-02 NOTE — Progress Notes (Signed)
? ?Patient Care Team: ?Jearld Fenton, NP as PCP - General (Internal Medicine) ?Nicholas Lose, MD as Consulting Physician (Hematology and Oncology) ?Excell Seltzer, MD (Inactive) as Consulting Physician (General Surgery) ?Eppie Gibson, MD as Attending Physician (Radiation Oncology) ?Holley Bouche, NP (Inactive) as Nurse Practitioner (Nurse Practitioner) ? ?DIAGNOSIS:  ?Encounter Diagnosis  ?Name Primary?  ? History of breast cancer Yes  ? ? ?SUMMARY OF ONCOLOGIC HISTORY: ?Oncology History  ?History of breast cancer  ?08/14/2014 Initial Diagnosis  ? Right breast high-grade invasive carcinoma spindle, epithelioid and squamous differentiation, ER 2%, PR 0%, HER-2 negative ratio 1.57, Ki-67 70% ?  ?08/21/2014 Breast MRI  ? Right breast enhancement at the site of the biopsy 1.2 x 1.2 x 1.4 cm enhancing mass, along lateral margin 1.8 cm hematoma ?  ?08/29/2014 Surgery  ? 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  ?10/01/2014 - 11/20/2014 Radiation Therapy  ? Right breast: Total of 50.4 Gy in 28 fractions.  Right breast boost: Total of 10 Gy in 5 fractions.  ?  ?10/18/2014 Procedure  ? Genetic testing found a BRCA2 VUS called [id number], but was otherwise normal ?  ? ? ?CHIEF COMPLIANT: Follow-up of recent hospitalization for gallbladder surgery, right breast findings ? ?INTERVAL HISTORY: Miranda Mueller is a 81 year old with above-mentioned history of right breast cancer treated with lumpectomy and radiation therapy.  She was followed for 5 years and was graduated from our program.  She recently was in the hospital for a cholecystectomy and a CT of the abdomen revealed abnormalities in the right breast for which she came to [place] for an evaluation.  She denies any pain or discomfort in the breast.  She is feeling significantly better since her  gallbladder surgery. ? ? ?ALLERGIES:  has No Known Allergies. ? ?MEDICATIONS:  ?Current Outpatient Medications  ?Medication Sig Dispense Refill  ? acetaminophen (TYLENOL) 500 MG tablet Take 500 mg by mouth every 6 (six) hours as needed.    ? aspirin 81 MG tablet Take 81 mg by mouth daily.    ? atorvastatin (LIPITOR) 10 MG tablet TAKE 1 TABLET DAILY 90 tablet 3  ? betamethasone dipropionate 0.05 % cream APPLY TOPICALLY AS NEEDED 30 g 11  ? cycloSPORINE (RESTASIS) 0.05 % ophthalmic emulsion Place 1 drop into both eyes 2 (two) times daily.      ? esomeprazole (NEXIUM) 40 MG capsule TAKE 1 CAPSULE DAILY AT 12 NOON 90 capsule 2  ? fexofenadine (ALLEGRA) 180 MG tablet Take 180 mg by mouth daily.    ? fluticasone (FLONASE) 50 MCG/ACT nasal spray TAKE 2 SPRAYS INTO BOTH NOSTRILS DAILY 16 g 4  ? hydrochlorothiazide (HYDRODIURIL) 25 MG tablet TAKE 1 TABLET DAILY 90 tablet 2  ? methocarbamol (ROBAXIN) 500 MG tablet TAKE 1 TABLET DAILY AS NEEDED FOR MUSCLE SPASMS 30 tablet 11  ? Multiple Vitamins-Minerals (HAIR/SKIN/NAILS PO) Take 1 tablet by mouth daily.    ? olmesartan (BENICAR) 20 MG tablet TAKE 1 TABLET DAILY 90 tablet 1  ? polycarbophil (FIBERCON) 625 MG tablet Take 1 tablet (625 mg total) by mouth 2 (two) times daily. 60 tablet 0  ? polyethylene glycol (MIRALAX) 17 g packet Take 17 g by mouth daily as needed for moderate constipation. 14 each 0  ? Potassium 99 MG TABS Take 1 tablet by mouth daily.      ? traMADol (  ULTRAM) 50 MG tablet Take 1-2 tablets (50-100 mg total) by mouth every 6 (six) hours as needed for moderate pain. 20 tablet 0  ? Zinc 50 MG CAPS Take by mouth.    ? ?No current facility-administered medications for this visit.  ? ? ?PHYSICAL EXAMINATION: ?ECOG PERFORMANCE STATUS: 1 - Symptomatic but completely ambulatory ? ?Vitals:  ? 01/05/22 1113  ?BP: 137/86  ?Pulse: 92  ?Resp: 17  ?Temp: (!) 97.3 ?F (36.3 ?C)  ?SpO2: 96%  ? ?Filed Weights  ? 01/05/22 1113  ?Weight: 249 lb (112.9 kg)  ? ? ?LABORATORY DATA:   ?I have reviewed the data as listed ? ?  Latest Ref Rng & Units 12/05/2021  ?  3:12 PM 11/29/2021  ?  7:32 AM 11/28/2021  ?  5:09 AM  ?CMP  ?Glucose 65 - 139 mg/dL 104   107   133    ?BUN 7 - 25 mg/dL _0 ?Creatinine 0.60 - 1.00 mg/dL 1.09   0.78   0.77    ?Sodium 135 - 146 mmol/L 133   133   135    ?Potassium 3.5 - 5.3 mmol/L 3.8   3.7   3.3    ?Chloride 98 - 110 mmol/L 95   104   107    ?CO2 20 - 32 mmol/L _1 ?Calcium 8.6 - 10.4 mg/dL 9.7   8.4   8.3    ?Total Protein 6.1 - 8.1 g/dL 7.6   7.3   6.6    ?Total Bilirubin 0.2 - 1.2 mg/dL 0.8   1.0   2.3    ?Alkaline Phos 38 - 126 U/L  78   77    ?AST 10 - 35 U/L 38   111   43    ?ALT 6 - 29 U/L 58   126   76    ? ? ?Lab Results  ?Component Value Date  ? WBC 11.9 (H) 12/05/2021  ? HGB 12.7 12/05/2021  ? HCT 37.9 12/05/2021  ? MCV 85.4 12/05/2021  ? PLT 632 (H) 12/05/2021  ? NEUTROABS 5.5 08/22/2014  ? ? 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 ?  ?Breast cancer surveillance: ?1. Breast exam 02/10/2018 does not reveal any areas of concern for recurrent breast cancer. ?2. Mammogram 09/28/2017 at [facility]: No mammographic evidence of malignancy; Density C ?   ?Hospitalization 11/25/2021-11/29/2021: Abdominal pain due to acute pancreatitis, incidentally noted CT showing right breast 4.9 x 2.9 cm fluid-filled lesion ?I reiterated to the patient and her family that CT scans are notorious for not being very clear about breast related findings. ? ?Plan: Mammograms and ultrasound will need to be repeated at [facility] ?Patient usually has annual mammograms in June. ?If the diagnostic mammogram on the right breast is negative then there is no need to follow-up  with [place]. ?She used to see Dr. Excell Seltzer previously.  If she needs any surgery then she will need to have a new Psychologist, sport and exercise.  I will need to see her back only if the mammograms or ultrasound or biopsy were abnormal. ? ? ? ?Orders Placed This Encounter  ?Procedures  ? MM DIAG BREAST  TOMO UNI RIGHT  ?  Standing Status:   Future  ?  Standing Expiration Date:   01/06/2023  ?  Order Specific Question:   Reason for Exam (SYMPTOM  OR DIAGNOSIS REQUIRED)  ?  Answer:   right breast  ?  Order Specific Question:   Preferred imaging location?  ?  Answer:   External  ?  Comments:   [facility]  ? ?The patient has a good understanding of the overall plan. she agrees with it. she will call with any problems that may develop before the next visit here. ?Total time spent: 30 mins including face to face time and time spent for planning, charting and co-ordination of care ? ? Harriette Ohara, MD ?01/05/22 ? ? ? I Gardiner Coins am scribing for Dr. Lindi Adie ? ?I have reviewed the above documentation for accuracy and completeness, and I agree with the above. ?  ?

## 2022-01-05 ENCOUNTER — Other Ambulatory Visit: Payer: Self-pay | Admitting: *Deleted

## 2022-01-05 ENCOUNTER — Inpatient Hospital Stay: Payer: Medicare Other | Attending: Hematology and Oncology | Admitting: Hematology and Oncology

## 2022-01-05 ENCOUNTER — Other Ambulatory Visit: Payer: Self-pay

## 2022-01-05 VITALS — BP 137/86 | HR 92 | Temp 97.3°F | Resp 17 | Ht 66.0 in | Wt 249.0 lb

## 2022-01-05 DIAGNOSIS — Z853 Personal history of malignant neoplasm of breast: Secondary | ICD-10-CM

## 2022-01-05 NOTE — Progress Notes (Signed)
Per MD request, RN scheduled mammogram and [place] at [facility].  Pt notified of appt date and time and verbalized understanding.  ?

## 2022-01-05 NOTE — Assessment & Plan Note (Addendum)
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 ?? ?Breast cancer surveillance: ?1. Breast exam 02/10/2018?does not reveal any areas of concern for recurrent breast cancer. ?2. Mammogram 09/28/2017?at [facility]: No mammographic evidence of malignancy; Density C ??  ?Hospitalization 11/25/2021-11/29/2021: Abdominal pain due to acute pancreatitis, incidentally noted CT showing right breast 4.9 x 2.9 cm fluid-filled lesion ?I reiterated to the patient and her family that CT scans are notorious for not being very clear about breast related findings. ? ?Plan: Mammograms and ultrasound will need to be repeated at [facility] ?Patient usually has annual mammograms in June. ?If the diagnostic mammogram on the right breast is negative then there is no need to follow-up with [place]. ?She used to see Dr. Excell Seltzer previously.  If she needs any surgery then she will need to have a new Psychologist, sport and exercise.  I will need to see her back only if the mammograms or ultrasound or biopsy were abnormal. ?

## 2022-01-08 DIAGNOSIS — Z853 Personal history of malignant neoplasm of breast: Secondary | ICD-10-CM | POA: Diagnosis not present

## 2022-01-08 DIAGNOSIS — R928 Other abnormal and inconclusive findings on diagnostic imaging of breast: Secondary | ICD-10-CM | POA: Diagnosis not present

## 2022-01-08 LAB — HM MAMMOGRAPHY

## 2022-01-12 ENCOUNTER — Other Ambulatory Visit: Payer: Self-pay | Admitting: Internal Medicine

## 2022-01-12 DIAGNOSIS — I10 Essential (primary) hypertension: Secondary | ICD-10-CM

## 2022-01-13 NOTE — Telephone Encounter (Signed)
Requested Prescriptions  ?Pending Prescriptions Disp Refills  ?? olmesartan (BENICAR) 20 MG tablet [Pharmacy Med Name: OLMESARTAN MEDOXOMIL TABS '20MG'$ ] 90 tablet 1  ?  Sig: TAKE 1 TABLET DAILY  ?  ? Cardiovascular:  Angiotensin Receptor Blockers Failed - 01/12/2022 12:19 AM  ?  ?  Failed - Cr in normal range and within 180 days  ?  Creatinine  ?Date Value Ref Range Status  ?08/22/2014 1.1 0.6 - 1.1 mg/dL Final  ? ?Creat  ?Date Value Ref Range Status  ?12/05/2021 1.09 (H) 0.60 - 1.00 mg/dL Final  ?   ?  ?  Passed - K in normal range and within 180 days  ?  Potassium  ?Date Value Ref Range Status  ?12/05/2021 3.8 3.5 - 5.3 mmol/L Final  ?08/22/2014 3.8 3.5 - 5.1 mEq/L Final  ?   ?  ?  Passed - Patient is not pregnant  ?  ?  Passed - Last BP in normal range  ?  BP Readings from Last 1 Encounters:  ?01/05/22 137/86  ?   ?  ?  Passed - Valid encounter within last 6 months  ?  Recent Outpatient Visits   ?      ? 1 month ago Acute biliary pancreatitis without infection or necrosis  ? [facility], Coralie Keens, NP  ? 6 months ago Medicare annual wellness visit, subsequent  ? [facility], Coralie Keens, NP  ?  ?  ?Future Appointments   ?        ? In 5 months Baity, Coralie Keens, NP [facility] [place]  ?  ? ?  ?  ?  ? ?

## 2022-01-18 ENCOUNTER — Emergency Department (HOSPITAL_BASED_OUTPATIENT_CLINIC_OR_DEPARTMENT_OTHER)
Admission: EM | Admit: 2022-01-18 | Discharge: 2022-01-18 | Disposition: A | Discharge status: Home / Self Care | Payer: Medicare Other | Attending: Emergency Medicine | Admitting: Emergency Medicine

## 2022-01-18 ENCOUNTER — Emergency Department (HOSPITAL_BASED_OUTPATIENT_CLINIC_OR_DEPARTMENT_OTHER): Payer: Medicare Other

## 2022-01-18 ENCOUNTER — Other Ambulatory Visit: Payer: Self-pay

## 2022-01-18 ENCOUNTER — Encounter (HOSPITAL_BASED_OUTPATIENT_CLINIC_OR_DEPARTMENT_OTHER): Payer: Self-pay

## 2022-01-18 DIAGNOSIS — R42 Dizziness and giddiness: Secondary | ICD-10-CM | POA: Diagnosis not present

## 2022-01-18 DIAGNOSIS — Z79899 Other long term (current) drug therapy: Secondary | ICD-10-CM | POA: Diagnosis not present

## 2022-01-18 DIAGNOSIS — I10 Essential (primary) hypertension: Secondary | ICD-10-CM | POA: Diagnosis not present

## 2022-01-18 DIAGNOSIS — I499 Cardiac arrhythmia, unspecified: Secondary | ICD-10-CM | POA: Diagnosis not present

## 2022-01-18 DIAGNOSIS — Z7982 Long term (current) use of aspirin: Secondary | ICD-10-CM | POA: Diagnosis not present

## 2022-01-18 DIAGNOSIS — R109 Unspecified abdominal pain: Secondary | ICD-10-CM | POA: Diagnosis not present

## 2022-01-18 DIAGNOSIS — E876 Hypokalemia: Secondary | ICD-10-CM | POA: Diagnosis not present

## 2022-01-18 DIAGNOSIS — R55 Syncope and collapse: Secondary | ICD-10-CM | POA: Diagnosis not present

## 2022-01-18 DIAGNOSIS — I440 Atrioventricular block, first degree: Secondary | ICD-10-CM | POA: Diagnosis not present

## 2022-01-18 DIAGNOSIS — R002 Palpitations: Secondary | ICD-10-CM | POA: Diagnosis not present

## 2022-01-18 LAB — BASIC METABOLIC PANEL
Anion gap: 12 (ref 5–15)
BUN: 20 mg/dL (ref 8–23)
CO2: 25 mmol/L (ref 22–32)
Calcium: 10 mg/dL (ref 8.9–10.3)
Chloride: 100 mmol/L (ref 98–111)
Creatinine, Ser: 1.15 mg/dL — ABNORMAL HIGH (ref 0.44–1.00)
GFR, Estimated: 48 mL/min — ABNORMAL LOW (ref >60)
Glucose, Bld: 145 mg/dL — ABNORMAL HIGH (ref 70–99)
Potassium: 2.9 mmol/L — ABNORMAL LOW (ref 3.5–5.1)
Sodium: 137 mmol/L (ref 135–145)

## 2022-01-18 LAB — CBC
HCT: 38.7 % (ref 36.0–46.0)
Hemoglobin: 12.6 g/dL (ref 12.0–15.0)
MCH: 28.3 pg (ref 26.0–34.0)
MCHC: 32.6 g/dL (ref 30.0–36.0)
MCV: 87 fL (ref 80.0–100.0)
Platelets: 420 10*3/uL — ABNORMAL HIGH (ref 150–400)
RBC: 4.45 MIL/uL (ref 3.87–5.11)
RDW: 13.9 % (ref 11.5–15.5)
WBC: 8.8 10*3/uL (ref 4.0–10.5)
nRBC: 0 % (ref 0.0–0.2)

## 2022-01-18 MED ORDER — POTASSIUM CHLORIDE CRYS ER 20 MEQ PO TBCR
60.0000 meq | EXTENDED_RELEASE_TABLET | Freq: Once | ORAL | Status: AC
  Administered 2022-01-18: 60 meq via ORAL
  Filled 2022-01-18: qty 3

## 2022-01-18 MED ORDER — METOPROLOL TARTRATE 5 MG/5ML IV SOLN: 2.5000 mg | Freq: Once | INTRAVENOUS | Status: DC

## 2022-01-18 MED ORDER — LACTATED RINGERS IV BOLUS
1000.0000 mL | Freq: Once | INTRAVENOUS | Status: AC
  Administered 2022-01-18: 1000 mL via INTRAVENOUS

## 2022-01-18 MED ORDER — POTASSIUM CHLORIDE ER 10 MEQ PO TBCR: 10.0000 meq | EXTENDED_RELEASE_TABLET | Freq: Every day | ORAL | 0 refills | Status: DC

## 2022-01-18 NOTE — ED Triage Notes (Signed)
Arrived via EMS.  Onset today of dizziness and almost passing out after eating a large meal.  Family reports that patient "zoned out" for a short while and call ems.  Patient states feels OK at present.  Gale bladder surgery 6 weeks ago.  States this was her first big meal since then.  Alert and orientated x 4  NAD ?

## 2022-01-18 NOTE — ED Provider Notes (Signed)
?[facility]PT ?Provider Note ? ? ?CSN: [id number] ?Arrival date & time: 01/18/22  1537 ? ?  ? ?History ? ?Chief Complaint  ?Patient presents with  ? Near Syncope  ? ? ?Miranda Mueller is a 81 y.o. female. ? ?81 year old female presents with near syncopal event just prior to arrival.  Patient states that she had abdominal cramping that then made her feel like she was going to pass out.  There was no actual loss of consciousness.  She denied any chest pain or shortness of breath with this.  Patient had large meal and recently had gallbladder surgery.  Denies any diarrhea.  She denies any palpations.  No prior history of same and no treatment use prior to arrival ? ? ?  ? ?Home Medications ?Prior to Admission medications   ?Medication Sig Start Date End Date Taking? Authorizing Provider  ?acetaminophen (TYLENOL) 500 MG tablet Take 500 mg by mouth every 6 (six) hours as needed.    [provider]  ?aspirin 81 MG tablet Take 81 mg by mouth daily.    [provider]  ?atorvastatin (LIPITOR) 10 MG tablet TAKE 1 TABLET DAILY 04/24/21   Jearld Fenton, NP  ?betamethasone dipropionate 0.05 % cream APPLY TOPICALLY AS NEEDED 09/01/21   Jearld Fenton, NP  ?cycloSPORINE (RESTASIS) 0.05 % ophthalmic emulsion Place 1 drop into both eyes 2 (two) times daily.      [provider]  ?esomeprazole (NEXIUM) 40 MG capsule TAKE 1 CAPSULE DAILY AT 12 NOON 07/14/21   Jearld Fenton, NP  ?fexofenadine (ALLEGRA) 180 MG tablet Take 180 mg by mouth daily.    [provider]  ?fluticasone (FLONASE) 50 MCG/ACT nasal spray TAKE 2 SPRAYS INTO BOTH NOSTRILS DAILY 07/03/21   Jearld Fenton, NP  ?hydrochlorothiazide (HYDRODIURIL) 25 MG tablet TAKE 1 TABLET DAILY 07/07/21   Jearld Fenton, NP  ?methocarbamol (ROBAXIN) 500 MG tablet TAKE 1 TABLET DAILY AS NEEDED FOR MUSCLE SPASMS 12/05/20   Jearld Fenton, NP  ?Multiple Vitamins-Minerals (HAIR/SKIN/NAILS PO) Take 1 tablet by mouth daily.     [provider]  ?olmesartan (BENICAR) 20 MG tablet TAKE 1 TABLET DAILY 01/13/22   Jearld Fenton, NP  ?polycarbophil (FIBERCON) 625 MG tablet Take 1 tablet (625 mg total) by mouth 2 (two) times daily. 12/06/21   Florencia Reasons, MD  ?polyethylene glycol (MIRALAX) 17 g packet Take 17 g by mouth daily as needed for moderate constipation. 11/29/21   Florencia Reasons, MD  ?Potassium 99 MG TABS Take 1 tablet by mouth daily.      [provider]  ?traMADol (ULTRAM) 50 MG tablet Take 1-2 tablets (50-100 mg total) by mouth every 6 (six) hours as needed for moderate pain. 11/29/21   Kinsinger, Arta Bruce, MD  ?Zinc 50 MG CAPS Take by mouth.    [provider]  ?   ? ?Allergies    ?Patient has no known allergies.   ? ?Review of Systems   ?Review of Systems  ?All other systems reviewed and are negative. ? ?Physical Exam ?Updated Vital Signs ?BP (!) 105/52 (BP Location: Left Arm)   Pulse 89   Temp 98.4 ?F (36.9 ?C) (Oral)   Resp 20   Ht 1.676 m ('5\' 6"'$ )   Wt 109.8 kg   SpO2 95%   BMI 39.06 kg/m?  ?Physical Exam ?Vitals and nursing note reviewed.  ?Constitutional:   ?   General: She is not in acute distress. ?  Appearance: Normal appearance. She is well-developed. She is not toxic-appearing.  ?HENT:  ?   Head: Normocephalic and atraumatic.  ?Eyes:  ?   General: Lids are normal.  ?   Conjunctiva/sclera: Conjunctivae normal.  ?   Pupils: Pupils are equal, round, and reactive to light.  ?Neck:  ?   Thyroid: No thyroid mass.  ?   Trachea: No tracheal deviation.  ?Cardiovascular:  ?   Rate and Rhythm: Normal rate. Rhythm irregular.  ?   Heart sounds: Normal heart sounds. No murmur heard. ?  No gallop.  ?Pulmonary:  ?   Effort: Pulmonary effort is normal. No respiratory distress.  ?   Breath sounds: Normal breath sounds. No stridor. No decreased breath sounds, wheezing, rhonchi or rales.  ?Abdominal:  ?   General: There is no distension.  ?   Palpations: Abdomen is soft.  ?   Tenderness: There is no abdominal  tenderness. There is no rebound.  ?Musculoskeletal:     ?   General: No tenderness. Normal range of motion.  ?   Cervical back: Normal range of motion and neck supple.  ?Skin: ?   General: Skin is warm and dry.  ?   Findings: No abrasion or rash.  ?Neurological:  ?   Mental Status: She is alert and oriented to person, place, and time. Mental status is at baseline.  ?   GCS: GCS eye subscore is 4. GCS verbal subscore is 5. GCS motor subscore is 6.  ?   Cranial Nerves: No cranial nerve deficit.  ?   Sensory: No sensory deficit.  ?   Motor: Motor function is intact.  ?Psychiatric:     ?   Attention and Perception: Attention normal.     ?   Speech: Speech normal.     ?   Behavior: Behavior normal.  ? ? ?ED Results / Procedures / Treatments   ?Labs ?(all labs ordered are listed, but only abnormal results are displayed) ?Labs Reviewed  ?CBC - Abnormal; Notable for the following components:  ?    Result Value  ? Platelets 420 (*)   ? All other components within normal limits  ?BASIC METABOLIC PANEL  ? ? ?EKG ?EKG Interpretation ? ?Date/Time:  Sunday January 18 2022 16:21:46 EDT ?Ventricular Rate:  100 ?PR Interval:  234 ?QRS Duration: 95 ?QT Interval:  346 ?QTC Calculation: 387 ?R Axis:   37 ?Text Interpretation: Sinus tachycardia Ventricular bigeminy Prolonged PR interval Low voltage, precordial leads Anteroseptal infarct, old Confirmed by Lacretia Leigh [phone]) on 01/18/2022 4:33:44 PM ? ?Radiology ?No results found. ? ?Procedures ?Procedures  ? ? ?Medications Ordered in ED ?Medications  ?metoprolol tartrate (LOPRESSOR) injection 2.5 mg (has no administration in time range)  ? ? ?ED Course/ Medical Decision Making/ A&P ?  ?                        ?Medical Decision Making ?Amount and/or Complexity of Data Reviewed ?Labs: ordered. ?Radiology: ordered. ?ECG/medicine tests: ordered. ? ?Risk ?Prescription drug management. ? ? ?Patient's EKG per my interpretation is shows bigeminy.  Patient was mildly hypokalemic which likely  explains her PVCs here.  Was given oral potassium and PVCs have greatly reduced.  She does take diuretics and I will place patient on potassium supplementation.  She has normal hemoglobin here at 12.6.  Chest x-ray per my interpretation shows no acute findings.  Feel that patient likely had a vagal episode.  Plan will be  to discharge home and she will follow-up with her primary care doctor.  Plan was discussed with her family at bedside and they are in agreement ? ? ? ? ? ? ? ?Final Clinical Impression(s) / ED Diagnoses ?Final diagnoses:  ?None  ? ? ?Rx / DC Orders ?ED Discharge Orders   ? ? None  ? ?  ? ? ?  ?Lacretia Leigh, MD ?01/18/22 1940 ? ?

## 2022-01-23 ENCOUNTER — Ambulatory Visit (INDEPENDENT_AMBULATORY_CARE_PROVIDER_SITE_OTHER): Payer: Medicare Other | Admitting: Internal Medicine

## 2022-01-23 ENCOUNTER — Encounter: Payer: Self-pay | Admitting: Internal Medicine

## 2022-01-23 VITALS — BP 124/76 | HR 88 | Temp 96.9°F | Wt 243.0 lb

## 2022-01-23 DIAGNOSIS — E876 Hypokalemia: Secondary | ICD-10-CM | POA: Diagnosis not present

## 2022-01-23 DIAGNOSIS — R55 Syncope and collapse: Secondary | ICD-10-CM

## 2022-01-23 MED ORDER — METHOCARBAMOL 500 MG PO TABS: ORAL_TABLET | ORAL | 0 refills | Status: DC

## 2022-01-23 NOTE — Progress Notes (Signed)
? ?Subjective:  ? ? Patient ID: Miranda Mueller, female    DOB: [birth year], 81 y.o.   MRN: [id number] ? ?HPI ? ?Pt presents to the clinic today for ER follow up. She went to the ER 4/9 with c/o feeling like she was going to pass out. Labs showed marginally elevated platelets and hypokalemia. ECG showed ventricular bigeminy. Chest xray was negative. She was treated with a potassium supplement. She was diagnosed with having a vagal episode, discharged and advised to follow up with her PCP. Since discharge, she denies recurrent near syncopal episodes. She is taking the Potassium as prescribed. ? ?Review of Systems ? ?Past Medical History:  ?Diagnosis Date  ? Bradycardia   ? Breast cancer ([place]) 08/29/14  ? Right Breast -High Grade Invasive Mammary  ? Cataract   ? surgery  ? GERD (gastroesophageal reflux disease)   ? History of shingles   ? Hx of radiation therapy 10/01/14- 11/20/14  ? right breast/50.4 Gy/28 fx; right breast boost/10 Gy/5 fx  ? Hyperlipidemia   ? Hypertension   ? IBS (irritable bowel syndrome)   ? Multifocal PVCs   ? PVC (premature ventricular contraction)   ? Hx of  ? Sleep apnea   ? no cpap per pt   ? ? ?Current Outpatient Medications  ?Medication Sig Dispense Refill  ? acetaminophen (TYLENOL) 500 MG tablet Take 500 mg by mouth every 6 (six) hours as needed.    ? aspirin 81 MG tablet Take 81 mg by mouth daily.    ? atorvastatin (LIPITOR) 10 MG tablet TAKE 1 TABLET DAILY 90 tablet 3  ? betamethasone dipropionate 0.05 % cream APPLY TOPICALLY AS NEEDED 30 g 11  ? cycloSPORINE (RESTASIS) 0.05 % ophthalmic emulsion Place 1 drop into both eyes 2 (two) times daily.      ? esomeprazole (NEXIUM) 40 MG capsule TAKE 1 CAPSULE DAILY AT 12 NOON 90 capsule 2  ? fexofenadine (ALLEGRA) 180 MG tablet Take 180 mg by mouth daily.    ? fluticasone (FLONASE) 50 MCG/ACT nasal spray TAKE 2 SPRAYS INTO BOTH NOSTRILS DAILY 16 g 4  ? hydrochlorothiazide (HYDRODIURIL) 25 MG tablet TAKE 1 TABLET DAILY 90 tablet 2  ? methocarbamol  (ROBAXIN) 500 MG tablet TAKE 1 TABLET DAILY AS NEEDED FOR MUSCLE SPASMS 30 tablet 11  ? Multiple Vitamins-Minerals (HAIR/SKIN/NAILS PO) Take 1 tablet by mouth daily.    ? olmesartan (BENICAR) 20 MG tablet TAKE 1 TABLET DAILY 90 tablet 1  ? polycarbophil (FIBERCON) 625 MG tablet Take 1 tablet (625 mg total) by mouth 2 (two) times daily. 60 tablet 0  ? polyethylene glycol (MIRALAX) 17 g packet Take 17 g by mouth daily as needed for moderate constipation. 14 each 0  ? Potassium 99 MG TABS Take 1 tablet by mouth daily.      ? potassium chloride (KLOR-CON) 10 MEQ tablet Take 1 tablet (10 mEq total) by mouth daily. 30 tablet 0  ? traMADol (ULTRAM) 50 MG tablet Take 1-2 tablets (50-100 mg total) by mouth every 6 (six) hours as needed for moderate pain. 20 tablet 0  ? Zinc 50 MG CAPS Take by mouth.    ? ?No current facility-administered medications for this visit.  ? ? ?No Known Allergies ? ?Family History  ?Problem Relation Age of Onset  ? Hypertension Mother   ? Heart disease Mother   ?     CAD  ? Breast cancer Mother 48  ? Hypertension Sister   ? Breast cancer Sister 77  ?  Aneurysm Sister   ? Hypertension Brother   ? Prostate cancer Brother 38  ? Leukemia Maternal Uncle 21  ? Heart disease Maternal Grandmother   ? Leukemia Maternal Uncle 11  ? Breast cancer Cousin   ?     dx under 45  ? Colon cancer Cousin   ?     dx in her 39s  ? Colon cancer Cousin   ? Lymphoma Other 49  ?     [employer]  ? Colon polyps Daughter   ? Stroke Father   ? Esophageal cancer Neg Hx   ? Stomach cancer Neg Hx   ? Rectal cancer Neg Hx   ? ? ?Social History  ? ?Socioeconomic History  ? Marital status: Married  ?  Spouse name: Homer  ? Number of children: 1  ? Years of education: 58  ? Highest education level: Not on file  ?Occupational History  ?  Comment: retired  ?Tobacco Use  ? Smoking status: Never  ? Smokeless tobacco: Never  ?Vaping Use  ? Vaping Use: Never used  ?Substance and Sexual Activity  ? Alcohol use: No  ?  Alcohol/week: 0.0 standard  drinks  ? Drug use: No  ? Sexual activity: Never  ?Other Topics Concern  ? Not on file  ?Social History Narrative  ? Consumes one glass of caffeine daily  ? ?Social Determinants of Health  ? ?Financial Resource Strain: Not on file  ?Food Insecurity: Not on file  ?Transportation Needs: Not on file  ?Physical Activity: Not on file  ?Stress: Not on file  ?Social Connections: Not on file  ?Intimate Partner Violence: Not on file  ? ? ? ?Constitutional: Denies fever, malaise, fatigue, headache or abrupt weight changes.  ?Respiratory: Pt reports mild shortness of breath. Denies difficulty breathing, cough or sputum production.   ?Cardiovascular: Denies chest pain, chest tightness, palpitations or swelling in the hands or feet.  ?Gastrointestinal: Denies abdominal pain, bloating, constipation, diarrhea or blood in the stool.  ?GU: Denies urgency, frequency, pain with urination, burning sensation, blood in urine, odor or discharge. ?Musculoskeletal: Denies decrease in range of motion, difficulty with gait, muscle pain or joint pain and swelling.  ?Skin: Denies redness, rashes, lesions or ulcercations.  ?Neurological: Denies dizziness, difficulty with memory, difficulty with speech or problems with balance and coordination.  ?Psych: Pt has a history of anxiety. Denies depression, SI/HI. ? ?No other specific complaints in a complete review of systems (except as listed in HPI above). ? ?   ?Objective:  ? Physical Exam ? ?BP 124/76 (BP Location: Left Arm, Patient Position: Sitting, Cuff Size: Large)   Pulse 88   Temp (!) 96.9 ?F (36.1 ?C) (Temporal)   Wt 243 lb (110.2 kg)   SpO2 99%   BMI 39.22 kg/m?  ? ? ? ?General: Appears her stated age, obese, in NAD. ?SHEENT: Head: normal shape and size; Eyes: sclera white, PERRLA and EOMs intact;  ?Neck:  Neck supple, trachea midline. No masses, lumps or thyromegaly present.  ?Cardiovascular: Normal rate and rhythm. S1,S2 noted.  No murmur, rubs or gallops noted. No JVD or BLE edema.   ?Pulmonary/Chest: Normal effort and positive vesicular breath sounds. No respiratory distress. No wheezes, rales or ronchi noted.  ?Musculoskeletal: No difficulty with gait.  ?Neurological: Alert and oriented.  ? ? ?BMET ?   ?Component Value Date/Time  ? NA 137 01/18/2022 1620  ? NA 138 08/22/2014 1237  ? K 2.9 (L) 01/18/2022 1620  ? K 3.8 08/22/2014 1237  ?  CL 100 01/18/2022 1620  ? CO2 25 01/18/2022 1620  ? CO2 28 08/22/2014 1237  ? GLUCOSE 145 (H) 01/18/2022 1620  ? GLUCOSE 102 08/22/2014 1237  ? BUN 20 01/18/2022 1620  ? BUN 16.0 08/22/2014 1237  ? CREATININE 1.15 (H) 01/18/2022 1620  ? CREATININE 1.09 (H) 12/05/2021 1512  ? CREATININE 1.1 08/22/2014 1237  ? CALCIUM 10.0 01/18/2022 1620  ? CALCIUM 9.8 08/22/2014 1237  ? GFRNONAA 48 (L) 01/18/2022 1620  ? GFRAA 72 12/01/2007 1105  ? ? ?Lipid Panel  ?   ?Component Value Date/Time  ? CHOL 146 07/03/2021 1037  ? TRIG 85 11/25/2021 0013  ? HDL 45 (L) 07/03/2021 1037  ? CHOLHDL 3.2 07/03/2021 1037  ? VLDL 29.8 07/02/2020 1009  ? LDLCALC 76 07/03/2021 1037  ? ? ?CBC ?   ?Component Value Date/Time  ? WBC 8.8 01/18/2022 1620  ? RBC 4.45 01/18/2022 1620  ? HGB 12.6 01/18/2022 1620  ? HGB 13.9 08/22/2014 1237  ? HCT 38.7 01/18/2022 1620  ? HCT 42.7 08/22/2014 1237  ? PLT 420 (H) 01/18/2022 1620  ? PLT 345 08/22/2014 1237  ? MCV 87.0 01/18/2022 1620  ? MCV 88.7 08/22/2014 1237  ? MCH 28.3 01/18/2022 1620  ? MCHC 32.6 01/18/2022 1620  ? RDW 13.9 01/18/2022 1620  ? RDW 13.8 08/22/2014 1237  ? LYMPHSABS 2.9 08/22/2014 1237  ? MONOABS 0.8 08/22/2014 1237  ? EOSABS 0.4 08/22/2014 1237  ? BASOSABS 0.1 08/22/2014 1237  ? ? ?Hgb A1C ?Lab Results  ?Component Value Date  ? HGBA1C 5.6 07/03/2021  ? ? ? ? ? ? ? ?   ?Assessment & Plan:  ? ?ER Follow Up for Near Syncope, Hypokalemia: ? ?ER notes, labs and imaging reviewed ?Repeat BMET today, she will need refill of Potassium supplement once labs are back ? ?RTC in 1 month for follow up of chronic conditions ?Webb Silversmith, NP ? ?

## 2022-01-23 NOTE — Patient Instructions (Signed)
Hypokalemia Hypokalemia means that the amount of potassium in the blood is lower than normal. Potassium is a mineral (electrolyte) that helps regulate the amount of fluid in the body. It also stimulates muscle tightening (contraction) and helps nerves work properly. Normally, most of the body's potassium is inside cells, and only a very small amount is in the blood. Because the amount in the blood is so small, minor changes to potassium levels in the blood can be life-threatening. What are the causes? This condition may be caused by: Antibiotic medicine. Diarrhea or vomiting. Taking too much of a medicine that helps you have a bowel movement (laxative) can cause diarrhea and lead to hypokalemia. Chronic kidney disease (CKD). Medicines that help the body get rid of excess fluid (diuretics). Eating disorders, such as anorexia or bulimia. Low magnesium levels in the body. Sweating a lot. What are the signs or symptoms? Symptoms of this condition include: Weakness. Constipation. Fatigue. Muscle cramps. Mental confusion. Skipped heartbeats or irregular heartbeat (palpitations). Tingling or numbness. How is this diagnosed? This condition is diagnosed with a blood test. How is this treated? This condition may be treated by: Taking potassium supplements. Adjusting the medicines that you take. Eating more foods that contain a lot of potassium. If your potassium level is very low, you may need to get potassium through an IV and be monitored in the hospital. Follow these instructions at home: Eating and drinking  Eat a healthy diet. A healthy diet includes fresh fruits and vegetables, whole grains, healthy fats, and lean proteins. If told, eat more foods that contain a lot of potassium. These include: Nuts, such as peanuts and pistachios. Seeds, such as sunflower seeds and pumpkin seeds. Peas, lentils, and lima beans. Whole grain and bran cereals and breads. Fresh fruits and vegetables,  such as apricots, avocado, bananas, cantaloupe, kiwi, oranges, tomatoes, asparagus, and potatoes. Juices, such as orange, tomato, and prune. Lean meats, including fish. Milk and milk products, such as yogurt. General instructions Take over-the-counter and prescription medicines only as told by your health care provider. This includes vitamins, natural food products, and supplements. Keep all follow-up visits. This is important. Contact a health care provider if: You have weakness that gets worse. You feel your heart pounding or racing. You vomit. You have diarrhea. You have diabetes and you have trouble keeping your blood sugar in your target range. Get help right away if: You have chest pain. You have shortness of breath. You have vomiting or diarrhea that lasts for more than 2 days. You faint. These symptoms may be an emergency. Get help right away. Call 911. Do not wait to see if the symptoms will go away. Do not drive yourself to the hospital. Summary Hypokalemia means that the amount of potassium in the blood is lower than normal. This condition is diagnosed with a blood test. Hypokalemia may be treated by taking potassium supplements, adjusting the medicines that you take, or eating more foods that are high in potassium. If your potassium level is very low, you may need to get potassium through an IV and be monitored in the hospital. This information is not intended to replace advice given to you by your health care provider. Make sure you discuss any questions you have with your health care provider. Document Revised: 06/12/2021 Document Reviewed: 06/12/2021 Elsevier Patient Education  2023 [employer]  

## 2022-01-24 LAB — BASIC METABOLIC PANEL WITH GFR
BUN: 15 mg/dL (ref 7–25)
CO2: 22 mmol/L (ref 20–32)
Calcium: 9.9 mg/dL (ref 8.6–10.4)
Chloride: 101 mmol/L (ref 98–110)
Creat: 0.85 mg/dL (ref 0.60–1.00)
Glucose, Bld: 97 mg/dL (ref 65–139)
Potassium: 3.9 mmol/L (ref 3.5–5.3)
Sodium: 137 mmol/L (ref 135–146)
eGFR: 70 mL/min/{1.73_m2} (ref >=60)

## 2022-02-04 ENCOUNTER — Ambulatory Visit
Admission: EM | Admit: 2022-02-04 | Discharge: 2022-02-04 | Disposition: A | Discharge status: Home / Self Care | Payer: Medicare Other | Attending: Emergency Medicine | Admitting: Emergency Medicine

## 2022-02-04 ENCOUNTER — Encounter: Payer: Self-pay | Admitting: Emergency Medicine

## 2022-02-04 DIAGNOSIS — H6693 Otitis media, unspecified, bilateral: Secondary | ICD-10-CM | POA: Diagnosis not present

## 2022-02-04 DIAGNOSIS — J0100 Acute maxillary sinusitis, unspecified: Secondary | ICD-10-CM

## 2022-02-04 DIAGNOSIS — I10 Essential (primary) hypertension: Secondary | ICD-10-CM

## 2022-02-04 MED ORDER — AMOXICILLIN 875 MG PO TABS: 875.0000 mg | ORAL_TABLET | Freq: Two times a day (BID) | ORAL | 0 refills | Status: AC

## 2022-02-04 MED ORDER — BENZONATATE 100 MG PO CAPS: 100.0000 mg | ORAL_CAPSULE | Freq: Three times a day (TID) | ORAL | 0 refills | Status: DC | PRN

## 2022-02-04 NOTE — Discharge Instructions (Addendum)
Take the amoxicillin as directed.  Follow up with your primary care provider if your symptoms are not improving.   ? ?Your blood pressure is elevated today at 159/85.  Please have this rechecked by your primary care provider in 2-4 weeks.     ? ?

## 2022-02-04 NOTE — ED Triage Notes (Signed)
Pt presents with cough, sinus pressure, and bilateral ear pain x 4 days  ?

## 2022-02-04 NOTE — ED Provider Notes (Signed)
?[facility] ? ? ? ?CSN: [id number] ?Arrival date & time: 02/04/22  1133 ? ? ?  ? ?History   ?Chief Complaint ?Chief Complaint  ?Patient presents with  ? Sinus Pressure  ? Otalgia  ? Cough  ? ? ?HPI ?Miranda Mueller is a 81 y.o. female.  Patient presents with 5-day history of sinus congestion, sinus pressure, cough, and ear pain.  She reports feeling warm and cold but no fever at home.  No rash, shortness of breath, vomiting, diarrhea, or other symptoms.  Treatment with Tylenol and NyQuil.  Her medical history includes hypertension, breast cancer, prediabetes, chronic back pain, CKD, morbid obesity. ? ?The history is provided by the patient and medical records.  ? ?Past Medical History:  ?Diagnosis Date  ? Bradycardia   ? Breast cancer ([place]) 08/29/14  ? Right Breast -High Grade Invasive Mammary  ? Cataract   ? surgery  ? GERD (gastroesophageal reflux disease)   ? History of shingles   ? Hx of radiation therapy 10/01/14- 11/20/14  ? right breast/50.4 Gy/28 fx; right breast boost/10 Gy/5 fx  ? Hyperlipidemia   ? Hypertension   ? IBS (irritable bowel syndrome)   ? Multifocal PVCs   ? PVC (premature ventricular contraction)   ? Hx of  ? Sleep apnea   ? no cpap per pt   ? ? ?Patient Active Problem List  ? Diagnosis Date Noted  ? Acute pancreatitis 11/26/2021  ? Gallstone pancreatitis 11/26/2021  ? Hypokalemia 11/26/2021  ? CKD (chronic kidney disease) stage 3, GFR 30-59 ml/min ([place]) 07/03/2021  ? Morbid obesity ([place]) 07/03/2021  ? Prediabetes 07/02/2020  ? Chronic midline low back pain with right-sided sciatica 07/02/2020  ? Eczema 07/02/2020  ? OSA (obstructive sleep apnea) 08/27/2015  ? History of breast cancer 08/20/2014  ? HLD (hyperlipidemia) 11/23/2007  ? Essential hypertension 11/23/2007  ? GERD 11/23/2007  ? ? ?Past Surgical History:  ?Procedure Laterality Date  ? BREAST LUMPECTOMY Right 08/29/14  ? started radiation 10/01/14  ? CATARACT EXTRACTION  2009  ? bilateral  ? CHOLECYSTECTOMY N/A 11/28/2021  ?  Procedure: LAPAROSCOPIC CHOLECYSTECTOMY WITH INTRAOPERATIVE CHOLANGIOGRAM, LIVER BIOPSY;  Surgeon: Michael Boston, MD;  Location: [facility];  Service: General;  Laterality: N/A;  ? COLONOSCOPY  2004-last 2016  ? normal  ? KNEE ARTHROSCOPY    ? right  ? NEEDLE CORE BIOPSY  RIGHT BREAST Right 08/14/14  ? OTHER SURGICAL HISTORY Left 1997  ? breast,cyst  ? PYLOROPLASTY  2016  ? RADIOACTIVE SEED GUIDED PARTIAL MASTECTOMY WITH AXILLARY SENTINEL LYMPH NODE BIOPSY Right 08/29/2014  ? Procedure: SEED LOCALIZED RIGHT BREAST LUMPECTOMY AND RIGHT AXILLARY SENTTINEL LYMPH NODE BIOPSY;  Surgeon: Excell Seltzer, MD;  Location: [facility];  Service: General;  Laterality: Right;  ? WISDOM TOOTH EXTRACTION    ? ? ?OB History   ? ? Gravida  ?1  ? Para  ?1  ? Term  ?   ? Preterm  ?   ? AB  ?   ? Living  ?   ?  ? ? SAB  ?   ? IAB  ?   ? Ectopic  ?   ? Multiple  ?   ? Live Births  ?   ?   ?  ? Obstetric Comments  ?Menses age 63, parity age 74, BC x 3-4 years, Menopause age 83, No HRT ?Mother breast cancer age 12 ; sister breast cancer at age 79  ?  ? ?  ? ? ? ?  Home Medications   ? ?Prior to Admission medications   ?Medication Sig Start Date End Date Taking? Authorizing Provider  ?amoxicillin (AMOXIL) 875 MG tablet Take 1 tablet (875 mg total) by mouth 2 (two) times daily for 10 days. 02/04/22 02/14/22 Yes Sharion Balloon, NP  ?benzonatate (TESSALON) 100 MG capsule Take 1 capsule (100 mg total) by mouth 3 (three) times daily as needed for cough. 02/04/22  Yes Sharion Balloon, NP  ?acetaminophen (TYLENOL) 500 MG tablet Take 500 mg by mouth every 6 (six) hours as needed.    [provider]  ?aspirin 81 MG tablet Take 81 mg by mouth daily.    [provider]  ?atorvastatin (LIPITOR) 10 MG tablet TAKE 1 TABLET DAILY 04/24/21   Jearld Fenton, NP  ?betamethasone dipropionate 0.05 % cream APPLY TOPICALLY AS NEEDED 09/01/21   Jearld Fenton, NP  ?cycloSPORINE (RESTASIS) 0.05 % ophthalmic emulsion Place 1 drop into both  eyes 2 (two) times daily.      [provider]  ?esomeprazole (NEXIUM) 40 MG capsule TAKE 1 CAPSULE DAILY AT 12 NOON 07/14/21   Jearld Fenton, NP  ?fexofenadine (ALLEGRA) 180 MG tablet Take 180 mg by mouth daily.    [provider]  ?fluticasone (FLONASE) 50 MCG/ACT nasal spray TAKE 2 SPRAYS INTO BOTH NOSTRILS DAILY 07/03/21   Jearld Fenton, NP  ?hydrochlorothiazide (HYDRODIURIL) 25 MG tablet TAKE 1 TABLET DAILY 07/07/21   Jearld Fenton, NP  ?methocarbamol (ROBAXIN) 500 MG tablet TAKE 1 TABLET DAILY AS NEEDED FOR MUSCLE SPASMS 01/23/22   Jearld Fenton, NP  ?Multiple Vitamins-Minerals (HAIR/SKIN/NAILS PO) Take 1 tablet by mouth daily.    [provider]  ?olmesartan (BENICAR) 20 MG tablet TAKE 1 TABLET DAILY 01/13/22   Jearld Fenton, NP  ?polycarbophil (FIBERCON) 625 MG tablet Take 1 tablet (625 mg total) by mouth 2 (two) times daily. 12/06/21   Florencia Reasons, MD  ?polyethylene glycol (MIRALAX) 17 g packet Take 17 g by mouth daily as needed for moderate constipation. 11/29/21   Florencia Reasons, MD  ?potassium chloride (KLOR-CON) 10 MEQ tablet Take 1 tablet (10 mEq total) by mouth daily. 01/18/22   Lacretia Leigh, MD  ?traMADol (ULTRAM) 50 MG tablet Take 1-2 tablets (50-100 mg total) by mouth every 6 (six) hours as needed for moderate pain. 11/29/21   Kinsinger, Arta Bruce, MD  ?Zinc 50 MG CAPS Take by mouth.    [provider]  ? ? ?Family History ?Family History  ?Problem Relation Age of Onset  ? Hypertension Mother   ? Heart disease Mother   ?     CAD  ? Breast cancer Mother 59  ? Hypertension Sister   ? Breast cancer Sister 98  ? Aneurysm Sister   ? Hypertension Brother   ? Prostate cancer Brother 63  ? Leukemia Maternal Uncle 31  ? Heart disease Maternal Grandmother   ? Leukemia Maternal Uncle 30  ? Breast cancer Cousin   ?     dx under 49  ? Colon cancer Cousin   ?     dx in her 55s  ? Colon cancer Cousin   ? Lymphoma Other 49  ?     [facility]  ? Colon polyps Daughter   ? Stroke Father   ?  Esophageal cancer Neg Hx   ? Stomach cancer Neg Hx   ? Rectal cancer Neg Hx   ? ? ?Social History ?Social History  ? ?Tobacco Use  ?  Smoking status: Never  ? Smokeless tobacco: Never  ?Vaping Use  ? Vaping Use: Never used  ?Substance Use Topics  ? Alcohol use: No  ?  Alcohol/week: 0.0 standard drinks  ? Drug use: No  ? ? ? ?Allergies   ?Patient has no known allergies. ? ? ?Review of Systems ?Review of Systems  ?Constitutional:  Negative for chills and fever.  ?HENT:  Positive for congestion, ear pain, postnasal drip, rhinorrhea and sinus pressure. Negative for sore throat.   ?Respiratory:  Positive for cough. Negative for shortness of breath.   ?Cardiovascular:  Negative for chest pain and palpitations.  ?Gastrointestinal:  Negative for diarrhea and vomiting.  ?Skin:  Negative for color change and rash.  ?All other systems reviewed and are negative. ? ? ?Physical Exam ?Triage Vital Signs ?ED Triage Vitals  ?Enc Vitals Group  ?   BP   ?   Pulse   ?   Resp   ?   Temp   ?   Temp src   ?   SpO2   ?   Weight   ?   Height   ?   Head Circumference   ?   Peak Flow   ?   Pain Score   ?   Pain Loc   ?   Pain Edu?   ?   Excl. in [place]?   ? ?No data found. ? ?Updated Vital Signs ?BP (!) 159/85   Pulse (!) 105   Temp 98.1 ?F (36.7 ?C)   Resp 20   SpO2 95%  ? ?Visual Acuity ?Right Eye Distance:   ?Left Eye Distance:   ?Bilateral Distance:   ? ?Right Eye Near:   ?Left Eye Near:    ?Bilateral Near:    ? ?Physical Exam ?Vitals and nursing note reviewed.  ?Constitutional:   ?   General: She is not in acute distress. ?   Appearance: She is well-developed. She is not ill-appearing.  ?HENT:  ?   Right Ear: Tympanic membrane is erythematous.  ?   Left Ear: Tympanic membrane is erythematous.  ?   Nose: Congestion and rhinorrhea present.  ?   Mouth/Throat:  ?   Mouth: Mucous membranes are moist.  ?   Pharynx: Posterior oropharyngeal erythema present.  ?Cardiovascular:  ?   Rate and Rhythm: Normal rate and regular rhythm.  ?   Heart  sounds: Normal heart sounds.  ?Pulmonary:  ?   Effort: Pulmonary effort is normal. No respiratory distress.  ?   Breath sounds: Normal breath sounds.  ?Musculoskeletal:  ?   Cervical back: Neck supple.  ?Skin: ?   General:

## 2022-02-23 ENCOUNTER — Ambulatory Visit (INDEPENDENT_AMBULATORY_CARE_PROVIDER_SITE_OTHER): Payer: Medicare Other | Admitting: Internal Medicine

## 2022-02-23 ENCOUNTER — Encounter: Payer: Self-pay | Admitting: Internal Medicine

## 2022-02-23 VITALS — BP 126/80 | HR 76 | Temp 97.3°F | Wt 242.0 lb

## 2022-02-23 DIAGNOSIS — I10 Essential (primary) hypertension: Secondary | ICD-10-CM

## 2022-02-23 DIAGNOSIS — I700 Atherosclerosis of aorta: Secondary | ICD-10-CM

## 2022-02-23 DIAGNOSIS — D75839 Thrombocytosis, unspecified: Secondary | ICD-10-CM | POA: Insufficient documentation

## 2022-02-23 DIAGNOSIS — E782 Mixed hyperlipidemia: Secondary | ICD-10-CM

## 2022-02-23 DIAGNOSIS — R7303 Prediabetes: Secondary | ICD-10-CM | POA: Diagnosis not present

## 2022-02-23 DIAGNOSIS — L308 Other specified dermatitis: Secondary | ICD-10-CM

## 2022-02-23 DIAGNOSIS — M5441 Lumbago with sciatica, right side: Secondary | ICD-10-CM | POA: Diagnosis not present

## 2022-02-23 DIAGNOSIS — G4733 Obstructive sleep apnea (adult) (pediatric): Secondary | ICD-10-CM | POA: Diagnosis not present

## 2022-02-23 DIAGNOSIS — Z853 Personal history of malignant neoplasm of breast: Secondary | ICD-10-CM

## 2022-02-23 DIAGNOSIS — N1831 Chronic kidney disease, stage 3a: Secondary | ICD-10-CM | POA: Diagnosis not present

## 2022-02-23 DIAGNOSIS — G8929 Other chronic pain: Secondary | ICD-10-CM

## 2022-02-23 DIAGNOSIS — Z6839 Body mass index (BMI) 39.0-39.9, adult: Secondary | ICD-10-CM

## 2022-02-23 MED ORDER — BENZONATATE 100 MG PO CAPS: 100.0000 mg | ORAL_CAPSULE | Freq: Three times a day (TID) | ORAL | 1 refills | Status: DC | PRN

## 2022-02-23 NOTE — Assessment & Plan Note (Signed)
CBC reviewed 

## 2022-02-23 NOTE — Patient Instructions (Signed)
Atherosclerosis ? ?Atherosclerosis is when plaque builds up in the arteries. This causes narrowing and hardening of the arteries. Arteries are blood vessels that carry blood from the heart to all parts of the body. This blood contains oxygen. Plaque occurs due to inflammation or from a buildup of fat, cholesterol, calcium, waste products of cells, and a clotting material in the blood (fibrin). Plaque decreases the amount of blood that can flow through the artery. ?Atherosclerosis can affect any artery in your body, including: ?Heart arteries. Damage to these arteries may lead to coronary artery disease, which can cause a heart attack. ?Brain arteries. Damage to these arteries may cause a stroke. ?Leg, arm, and pelvis arteries. Peripheral artery disease (PAD) may result from damage to these arteries. ?Kidney arteries. Kidney (renal) failure may result from damage to kidney arteries. ?Treatment may slow the disease and prevent further damage to your heart, brain, peripheral arteries, and kidneys. ?What are the causes? ?This condition develops slowly over many years. The inner layers of your arteries become damaged and allow the gradual buildup of plaque. The exact cause of atherosclerosis is not fully understood. Symptoms of atherosclerosis do not occur until an artery becomes narrow or blocked. ?What increases the risk? ?The following factors may make you more likely to develop this condition: ?Being middle-aged or older. ?Certain medical conditions, including: ?High blood pressure. ?High cholesterol. ?High blood fats (triglycerides). ?Diabetes. ?Sleep apnea. ?Obesity. ?Certain lab levels, including: ?Elevated C-reactive protein (CRP). This is a sign of increased inflammation in your body. ?Elevated homocysteine levels. This is an amino acid that is associated with heart and blood vessel disease. ?Using tobacco or nicotine products. ?A family history of atherosclerosis. ?Not exercising enough (sedentary  lifestyle). ?Being stressed. ?Drinking too much alcohol or using drugs, such as cocaine or methamphetamine. ?What are the signs or symptoms? ?Symptoms of atherosclerosis do not occur until the plaque severely narrows or blocks the artery, which decreases blood flow. Sometimes, atherosclerosis does not cause symptoms. ?Symptoms of this condition include: ?Coronary artery disease. This may cause chest pain and shortness of breath. ?Decreased blood supply to your brain, which may cause a stroke. Signs of a stroke may include sudden: ?Weakness or numbness in your face, arm, or leg, especially on one side of your body. ?Trouble walking or difficulty moving your arms or legs. ?Loss of balance or coordination. ?Confusion. ?Slurred speech. ?Trouble speaking, or trouble understanding speech, or both (aphasia). ?Vision changes in one or both eyes. This may be double vision, blurred vision, or loss of vision. ?Severe headache with no known cause. The headache is often described as the worst headache ever experienced. ?PAD, which may cause pain, numbness, or nonhealing wounds, often in your legs and hips. ?Renal failure. This may cause tiredness, problems with urination, swelling, and itchy skin. ?How is this diagnosed? ?This condition is diagnosed based on your medical history and a physical exam. During the exam, your health care provider will: ?Check your pulse in different places. ?Listen for a "whooshing" sound over your arteries (bruit). ?You may also have tests, such as: ?Blood tests to check your levels of cholesterol, triglycerides, blood sugar, and CRP. ?Ankle-brachial index to compare blood pressure in your arms to blood pressure in your ankles to see how your blood is flowing. ?Heart (cardiac) tests. ?Electrocardiogram (ECG) to check for heart damage. ?Stress test to see how your heart reacts to exercise. ?Ultrasound tests. ?Ultrasound of your peripheral arteries to check blood flow. ?Echocardiogram to get images of  your heart's  chambers and valves. ?X-ray tests. ?Chest X-ray to see if you have an enlarged heart, which is a sign of heart failure. ?CT scan to check for damage to your heart, brain, or arteries. ?Angiogram. This is a test where dye is injected and X-rays are used to see the blood flow in the arteries. ?How is this treated? ?This condition is treated with lifestyle changes as the first step. These may include: ?Changing your diet. ?Losing weight. ?Reducing stress. ?Exercising and being physically active more regularly. ?Quitting smoking. ?You may also need medicine to: ?Lower triglycerides and cholesterol. ?Control blood pressure. ?Prevent blood clots. ?Lower inflammation in your body. ?Control your blood sugar. ?Sometimes, surgery is needed to: ?Remove plaque from an artery (endarterectomy). ?Open or widen a narrowed heart artery or peripheral artery (angioplasty). ?Create a new path for your blood with one of these procedures: ?Heart (coronary) artery bypass graft surgery. ?Peripheral artery bypass graft surgery. ?Place a small mesh tube (stent) in an artery to open or widen a narrowed artery. ?Follow these instructions at home: ?Eating and drinking ? ?Eat a heart-healthy diet. Talk with your health care provider or a dietitian if you need help. A heart-healthy diet involves: ?Limiting unhealthy fats and increasing healthy fats. Some examples of healthy fats are avocados and olive oil. ?Eating plant-based foods, such as fruits, vegetables, nuts, whole grains, and legumes (such as peas and lentils). ?If you drink alcohol: ?Limit how much you have to: ?0-1 drink a day for women who are not pregnant. ?0-2 drinks a day for men. ?Know how much alcohol is in a drink. In the [place] one drink equals one 12 oz bottle of beer (355 mL), one 5 oz glass of wine (148 mL), or one 1? oz glass of hard liquor (44 mL). ?Lifestyle ? ?Maintain a healthy weight. Lose weight if your health care provider says that you need to do  that. ?Follow an exercise program as told by your health care provider. ?Do not use any products that contain nicotine or tobacco. These products include cigarettes, chewing tobacco, and vaping devices, such as e-cigarettes. If you need help quitting, ask your health care provider. ?Do not use drugs. ?General instructions ?Take over-the-counter and prescription medicines only as told by your health care provider. ?Manage other health conditions as told. ?Keep all follow-up visits. This is important. ?Contact a health care provider if you have: ?An irregular heartbeat. ?Unexplained tiredness (fatigue). ?Trouble urinating, or you are producing less urine or foamy urine. ?Swelling of your hands or feet, or itchy skin. ?Unexplained pain or numbness in your legs or hips. ?A wound that is slow to heal or is not healing. ?Get help right away if: ?You have any symptoms of a heart attack. These may be: ?Chest pain. This includes squeezing chest pain that may feel like indigestion (angina). ?Shortness of breath. ?Pain in your neck, jaw, arms, back, or stomach. ?Cold sweat. ?Nausea. ?Light-headedness. ?Sudden pain, numbness, or coldness in a limb. ?You have any symptoms of a stroke. "BE FAST" is an easy way to remember the main warning signs of a stroke: ?B - Balance. Signs are dizziness, sudden trouble walking, or loss of balance. ?E - Eyes. Signs are trouble seeing or a sudden change in vision. ?F - Face. Signs are sudden weakness or numbness of the face, or the face or eyelid drooping on one side. ?A - Arms. Signs are weakness or numbness in an arm. This happens suddenly and usually on one side of the body. ?S -  Speech. Signs are sudden trouble speaking, slurred speech, or trouble understanding what people say. ?T - Time. Time to call emergency services. Write down what time symptoms started. ?You have other signs of a stroke, such as: ?A sudden, severe headache with no known cause. ?Nausea or vomiting. ?Seizure. ?These  symptoms may represent a serious problem that is an emergency. Do not wait to see if the symptoms will go away. Get medical help right away. Call your local emergency services (911 in the [place] Do not drive yourself to the

## 2022-02-23 NOTE — Assessment & Plan Note (Signed)
Encourage weight loss as this can help reduce sleep apnea symptoms ?She is not wearing CPAP ?

## 2022-02-23 NOTE — Assessment & Plan Note (Signed)
We will check A1c at next visit ?Encouraged her to consume a low-carb diet and exercise for weight loss ?

## 2022-02-23 NOTE — Progress Notes (Signed)
? ?Subjective:  ? ? Patient ID: Miranda Mueller, female    DOB: [birth year], 81 y.o.   MRN: [id number] ? ?HPI ? ?Patient presents to clinic today for follow-up of chronic conditions. ? ?History of Breast Cancer: In remission.  She gets yearly mammograms. ? ?GERD: She is not sure what triggers this.  She denies breakthrough on Esomeprazole.  Upper GI from 10/2003 reviewed. ? ?HLD with Aortic Atherosclerosis: Her last LDL was 76, triglycerides 146, 06/2021.  She denies myalgias on Atorvastatin.  She tries to consume a low-fat diet. ? ?HTN: Her BP today is 126/80.  She is taking Olmesartan, HCTZ and Potassium as prescribed.  ECG from 01/2022 reviewed. ? ?OSA: She is not currently wearing her CPAP.  There is no sleep study on file. ? ?Chronic Back Pain with Right-Sided Sciatica: Chronic, managed with Tylenol and Methocarbamol as needed.  She no longer follows with physiatry. ? ?Eczema: She is using Betamethasone cream as needed.  She does not follow with dermatology. ? ?Prediabetes: Her last A1c was 5.6%, 06/2021.  She does not check her sugars.  She is not taking any oral diabetic medications at this time. ? ?CKD 3: Her last creatinine was .85, GFR 70, 01/2022.  She is on olmesartan for renal protection.  She does not follow with nephrology. ? ?Thrombocytosis: Her last platelet count was 420, 01/2022.  She does not follow with hematology. ? ?Review of Systems ? ?   ?Past Medical History:  ?Diagnosis Date  ? Bradycardia   ? Breast cancer ([place]) 08/29/14  ? Right Breast -High Grade Invasive Mammary  ? Cataract   ? surgery  ? GERD (gastroesophageal reflux disease)   ? History of shingles   ? Hx of radiation therapy 10/01/14- 11/20/14  ? right breast/50.4 Gy/28 fx; right breast boost/10 Gy/5 fx  ? Hyperlipidemia   ? Hypertension   ? IBS (irritable bowel syndrome)   ? Multifocal PVCs   ? PVC (premature ventricular contraction)   ? Hx of  ? Sleep apnea   ? no cpap per pt   ? ? ?Current Outpatient Medications  ?Medication Sig Dispense  Refill  ? acetaminophen (TYLENOL) 500 MG tablet Take 500 mg by mouth every 6 (six) hours as needed.    ? aspirin 81 MG tablet Take 81 mg by mouth daily.    ? atorvastatin (LIPITOR) 10 MG tablet TAKE 1 TABLET DAILY 90 tablet 3  ? benzonatate (TESSALON) 100 MG capsule Take 1 capsule (100 mg total) by mouth 3 (three) times daily as needed for cough. 21 capsule 0  ? betamethasone dipropionate 0.05 % cream APPLY TOPICALLY AS NEEDED 30 g 11  ? cycloSPORINE (RESTASIS) 0.05 % ophthalmic emulsion Place 1 drop into both eyes 2 (two) times daily.      ? esomeprazole (NEXIUM) 40 MG capsule TAKE 1 CAPSULE DAILY AT 12 NOON 90 capsule 2  ? fexofenadine (ALLEGRA) 180 MG tablet Take 180 mg by mouth daily.    ? fluticasone (FLONASE) 50 MCG/ACT nasal spray TAKE 2 SPRAYS INTO BOTH NOSTRILS DAILY 16 g 4  ? hydrochlorothiazide (HYDRODIURIL) 25 MG tablet TAKE 1 TABLET DAILY 90 tablet 2  ? methocarbamol (ROBAXIN) 500 MG tablet TAKE 1 TABLET DAILY AS NEEDED FOR MUSCLE SPASMS 90 tablet 0  ? Multiple Vitamins-Minerals (HAIR/SKIN/NAILS PO) Take 1 tablet by mouth daily.    ? olmesartan (BENICAR) 20 MG tablet TAKE 1 TABLET DAILY 90 tablet 1  ? polycarbophil (FIBERCON) 625 MG tablet Take 1 tablet (625 mg  total) by mouth 2 (two) times daily. 60 tablet 0  ? polyethylene glycol (MIRALAX) 17 g packet Take 17 g by mouth daily as needed for moderate constipation. 14 each 0  ? potassium chloride (KLOR-CON) 10 MEQ tablet Take 1 tablet (10 mEq total) by mouth daily. 30 tablet 0  ? traMADol (ULTRAM) 50 MG tablet Take 1-2 tablets (50-100 mg total) by mouth every 6 (six) hours as needed for moderate pain. 20 tablet 0  ? Zinc 50 MG CAPS Take by mouth.    ? ?No current facility-administered medications for this visit.  ? ? ?No Known Allergies ? ?Family History  ?Problem Relation Age of Onset  ? Hypertension Mother   ? Heart disease Mother   ?     CAD  ? Breast cancer Mother 56  ? Hypertension Sister   ? Breast cancer Sister 45  ? Aneurysm Sister   ?  Hypertension Brother   ? Prostate cancer Brother 77  ? Leukemia Maternal Uncle 60  ? Heart disease Maternal Grandmother   ? Leukemia Maternal Uncle 35  ? Breast cancer Cousin   ?     dx under 54  ? Colon cancer Cousin   ?     dx in her 65s  ? Colon cancer Cousin   ? Lymphoma Other 49  ?     NHL  ? Colon polyps Daughter   ? Stroke Father   ? Esophageal cancer Neg Hx   ? Stomach cancer Neg Hx   ? Rectal cancer Neg Hx   ? ? ?Social History  ? ?Socioeconomic History  ? Marital status: Married  ?  Spouse name: Homer  ? Number of children: 1  ? Years of education: 60  ? Highest education level: Not on file  ?Occupational History  ?  Comment: retired  ?Tobacco Use  ? Smoking status: Never  ? Smokeless tobacco: Never  ?Vaping Use  ? Vaping Use: Never used  ?Substance and Sexual Activity  ? Alcohol use: No  ?  Alcohol/week: 0.0 standard drinks  ? Drug use: No  ? Sexual activity: Never  ?Other Topics Concern  ? Not on file  ?Social History Narrative  ? Consumes one glass of caffeine daily  ? ?Social Determinants of Health  ? ?Financial Resource Strain: Not on file  ?Food Insecurity: Not on file  ?Transportation Needs: Not on file  ?Physical Activity: Not on file  ?Stress: Not on file  ?Social Connections: Not on file  ?Intimate Partner Violence: Not on file  ? ? ? ?Constitutional: Denies fever, malaise, fatigue, headache or abrupt weight changes.  ?HEENT: Denies eye pain, eye redness, ear pain, ringing in the ears, wax buildup, runny nose, nasal congestion, bloody nose, or sore throat. ?Respiratory: Denies difficulty breathing, shortness of breath, cough or sputum production.   ?Cardiovascular: Denies chest pain, chest tightness, palpitations or swelling in the hands or feet.  ?Gastrointestinal: Denies abdominal pain, bloating, constipation, diarrhea or blood in the stool.  ?GU: Denies urgency, frequency, pain with urination, burning sensation, blood in urine, odor or discharge. ?Musculoskeletal: Patient reports chronic back  pain.  Denies decrease in range of motion, difficulty with gait, or joint swelling.  ?Skin: Denies redness, rashes, lesions or ulcercations.  ?Neurological: Denies dizziness, difficulty with memory, difficulty with speech or problems with balance and coordination.  ?Psych: Denies anxiety, depression, SI/HI. ? ?No other specific complaints in a complete review of systems (except as listed in HPI above). ? ?Objective:  ? Physical  Exam ? ?BP 126/80 (BP Location: Left Arm, Patient Position: Sitting, Cuff Size: Large)   Pulse 76   Temp (!) 97.3 ?F (36.3 ?C) (Temporal)   Wt 242 lb (109.8 kg)   SpO2 98%   BMI 39.06 kg/m?  ? ?Wt Readings from Last 3 Encounters:  ?01/23/22 243 lb (110.2 kg)  ?01/18/22 242 lb (109.8 kg)  ?01/05/22 249 lb (112.9 kg)  ? ? ?General: Appears her stated age, obese, in NAD. ?Skin: Warm, dry and intact.  ?HEENT: Head: normal shape and size; Eyes: sclera white, no icterus, conjunctiva pink, PERRLA and EOMs intact;  ?Cardiovascular: Normal rate and rhythm. S1,S2 noted.  No murmur, rubs or gallops noted. No JVD or BLE edema. No carotid bruits noted. ?Pulmonary/Chest: Normal effort and positive vesicular breath sounds. No respiratory distress. No wheezes, rales or ronchi noted.  ?Abdomen: Soft and nontender. Normal bowel sounds.  ?Musculoskeletal: No difficulty with gait.  ?Neurological: Alert and oriented. Coordination normal.  ?Psychiatric: Mood and affect normal. Behavior is normal. Judgment and thought content normal.  ? ? ?BMET ?   ?Component Value Date/Time  ? NA 137 01/23/2022 1429  ? NA 138 08/22/2014 1237  ? K 3.9 01/23/2022 1429  ? K 3.8 08/22/2014 1237  ? CL 101 01/23/2022 1429  ? CO2 22 01/23/2022 1429  ? CO2 28 08/22/2014 1237  ? GLUCOSE 97 01/23/2022 1429  ? GLUCOSE 102 08/22/2014 1237  ? BUN 15 01/23/2022 1429  ? BUN 16.0 08/22/2014 1237  ? CREATININE 0.85 01/23/2022 1429  ? CREATININE 1.1 08/22/2014 1237  ? CALCIUM 9.9 01/23/2022 1429  ? CALCIUM 9.8 08/22/2014 1237  ? GFRNONAA 48  (L) 01/18/2022 1620  ? GFRAA 72 12/01/2007 1105  ? ? ?Lipid Panel  ?   ?Component Value Date/Time  ? CHOL 146 07/03/2021 1037  ? TRIG 85 11/25/2021 0013  ? HDL 45 (L) 07/03/2021 1037  ? CHOLHDL 3.2 07/03/2021 1037  ? VLD

## 2022-02-23 NOTE — Assessment & Plan Note (Signed)
She will continue yearly mammograms ?

## 2022-02-23 NOTE — Assessment & Plan Note (Signed)
Encouraged her to consume a low fat diet Continue Atorvastatin 

## 2022-02-23 NOTE — Assessment & Plan Note (Signed)
Encourage weight loss as this can help reduce back pain ?Continue Tylenol and Methocarbamol as needed ?

## 2022-02-23 NOTE — Assessment & Plan Note (Signed)
Recent kidney function reviewed ?Continue Olmesartan for renal protection ?

## 2022-02-23 NOTE — Assessment & Plan Note (Signed)
Controlled on Olmesartan and HCTZ ?She is no longer taking Potassium, will D/C ?Reinforced DASH diet and exercise for weight loss ?We will monitor ?

## 2022-02-23 NOTE — Assessment & Plan Note (Signed)
Encourage diet and exercise for weight loss 

## 2022-02-23 NOTE — Assessment & Plan Note (Signed)
Encouraged her to consume low-fat diet ?Continue Atorvastatin and Aspirin ?

## 2022-02-23 NOTE — Assessment & Plan Note (Signed)
Continue Betamethasone as needed ?

## 2022-04-06 ENCOUNTER — Other Ambulatory Visit: Payer: Self-pay | Admitting: Internal Medicine

## 2022-04-06 DIAGNOSIS — I10 Essential (primary) hypertension: Secondary | ICD-10-CM

## 2022-04-13 ENCOUNTER — Other Ambulatory Visit: Payer: Self-pay | Admitting: Internal Medicine

## 2022-04-13 NOTE — Telephone Encounter (Signed)
Requested Prescriptions  Pending Prescriptions Disp Refills  . esomeprazole (NEXIUM) 40 MG capsule [Pharmacy Med Name: ESOMEPRAZOLE MAGNESIUM DR CAPS '40MG'$ ] 90 capsule 0    Sig: TAKE 1 CAPSULE DAILY AT 12 NOON     Gastroenterology: Proton Pump Inhibitors 2 Failed - 04/13/2022 12:24 AM      Failed - ALT in normal range and within 360 days    ALT  Date Value Ref Range Status  12/05/2021 58 (H) 6 - 29 U/L Final  08/22/2014 22 0 - 55 U/L Final         Failed - AST in normal range and within 360 days    AST  Date Value Ref Range Status  12/05/2021 38 (H) 10 - 35 U/L Final  08/22/2014 17 5 - 34 U/L Final         Passed - Valid encounter within last 12 months    Recent Outpatient Visits          1 month ago Aortic atherosclerosis [facility])   [facility] [place], Coralie Keens, NP   2 months ago Hypokalemia   [facility] [place], [place] W, NP   4 months ago Acute biliary pancreatitis without infection or necrosis   [place], NP   9 months ago Medicare annual wellness visit, subsequent   [facility] [place], Coralie Keens, NP      Future Appointments            In 2 months Baity, Coralie Keens, NP [facility] [place]           . atorvastatin (LIPITOR) 10 MG tablet [Pharmacy Med Name: ATORVASTATIN TABS '10MG'$ ] 90 tablet 0    Sig: TAKE 1 TABLET DAILY     Cardiovascular:  Antilipid - Statins Failed - 04/13/2022 12:24 AM      Failed - Lipid Panel in normal range within the last 12 months    Cholesterol  Date Value Ref Range Status  07/03/2021 146 <200 mg/dL Final   LDL Cholesterol (Calc)  Date Value Ref Range Status  07/03/2021 76 mg/dL (calc) Final    Comment:    Reference range: <100 . Desirable range <100 mg/dL for primary prevention;   <70 mg/dL for patients with CHD or diabetic patients  with > or = 2 CHD risk factors. Marland Kitchen LDL-C is now calculated using the Martin-Hopkins  calculation, which is a  validated novel method providing  better accuracy than the Friedewald equation in the  estimation of LDL-C.  Cresenciano Genre et al. Annamaria Helling. 1191;478(29): 2061-2068  (http://education.[employer].com/faq/FAQ164)    Direct LDL  Date Value Ref Range Status  07/18/2012 153.2 mg/dL Final    Comment:    Optimal:  <100 mg/dLNear or Above Optimal:  100-129 mg/dLBorderline High:  130-159 mg/dLHigh:  160-189 mg/dLVery High:  >190 mg/dL   HDL  Date Value Ref Range Status  07/03/2021 45 (L) > OR = 50 mg/dL Final   Triglycerides  Date Value Ref Range Status  11/25/2021 85 <150 mg/dL Final    Comment:    Performed at [facility], [place] [address], [place], [place] [address]         Passed - Patient is not pregnant      Passed - Valid encounter within last 12 months    Recent Outpatient Visits          1 month ago Aortic atherosclerosis ([place])   [facility]  Jearld Fenton, NP   2 months ago Hypokalemia   [facility] [place], [place] W, NP   4 months ago Acute biliary pancreatitis without infection or necrosis   [place], NP   9 months ago Medicare annual wellness visit, subsequent   [facility], Coralie Keens, NP      Future Appointments            In 2 months Baity, Coralie Keens, NP [facility], [facility]

## 2022-04-15 ENCOUNTER — Other Ambulatory Visit: Payer: Self-pay | Admitting: Internal Medicine

## 2022-04-15 NOTE — Telephone Encounter (Signed)
Requested medication (s) are due for refill today - yes  Requested medication (s) are on the active medication list -yes  Future visit scheduled - yes  Last refill: 01/23/22 #90  Notes to clinic: non delegated Rx  Requested Prescriptions  Pending Prescriptions Disp Refills   methocarbamol (ROBAXIN) 500 MG tablet [Pharmacy Med Name: METHOCARBAMOL TABS '500MG'$ ] 90 tablet 3    Sig: TAKE 1 TABLET DAILY AS NEEDED FOR MUSCLE SPASMS     Not Delegated - Analgesics:  Muscle Relaxants Failed - 04/15/2022 10:06 AM      Failed - This refill cannot be delegated      Passed - Valid encounter within last 6 months    Recent Outpatient Visits           1 month ago Aortic atherosclerosis ([place])   [facility], Coralie Keens, NP   2 months ago Hypokalemia   [facility] [place], [place], NP   4 months ago Acute biliary pancreatitis without infection or necrosis   [facility] [place], Coralie Keens, NP   9 months ago Medicare annual wellness visit, subsequent   [facility] [place], Coralie Keens, NP       Future Appointments             In 2 months Baity, Coralie Keens, NP [facility], [facility]               Requested Prescriptions  Pending Prescriptions Disp Refills   methocarbamol (ROBAXIN) 500 MG tablet [Pharmacy Med Name: METHOCARBAMOL TABS '500MG'$ ] 90 tablet 3    Sig: TAKE 1 TABLET DAILY AS NEEDED FOR MUSCLE SPASMS     Not Delegated - Analgesics:  Muscle Relaxants Failed - 04/15/2022 10:06 AM      Failed - This refill cannot be delegated      Passed - Valid encounter within last 6 months    Recent Outpatient Visits           1 month ago Aortic atherosclerosis [facility])   [facility] [place], Coralie Keens, NP   2 months ago Hypokalemia   [facility] [place], [place] W, NP   4 months ago Acute biliary pancreatitis without infection or necrosis   [facility] [place], Coralie Keens, NP   9 months  ago Medicare annual wellness visit, subsequent   [facility] [place], Coralie Keens, NP       Future Appointments             In 2 months Baity, Coralie Keens, NP [facility], [facility]

## 2022-05-18 DIAGNOSIS — H16223 Keratoconjunctivitis sicca, not specified as Sjogren's, bilateral: Secondary | ICD-10-CM | POA: Diagnosis not present

## 2022-05-18 DIAGNOSIS — Z961 Presence of intraocular lens: Secondary | ICD-10-CM | POA: Diagnosis not present

## 2022-05-18 DIAGNOSIS — H1712 Central corneal opacity, left eye: Secondary | ICD-10-CM | POA: Diagnosis not present

## 2022-05-18 DIAGNOSIS — H18593 Other hereditary corneal dystrophies, bilateral: Secondary | ICD-10-CM | POA: Diagnosis not present

## 2022-05-18 DIAGNOSIS — D3132 Benign neoplasm of left choroid: Secondary | ICD-10-CM | POA: Diagnosis not present

## 2022-07-08 ENCOUNTER — Encounter: Payer: Self-pay | Admitting: Internal Medicine

## 2022-07-08 ENCOUNTER — Ambulatory Visit (INDEPENDENT_AMBULATORY_CARE_PROVIDER_SITE_OTHER): Payer: Medicare Other | Admitting: Internal Medicine

## 2022-07-08 VITALS — BP 126/80 | HR 69 | Temp 97.1°F | Ht 66.5 in | Wt 244.0 lb

## 2022-07-08 DIAGNOSIS — K219 Gastro-esophageal reflux disease without esophagitis: Secondary | ICD-10-CM | POA: Diagnosis not present

## 2022-07-08 DIAGNOSIS — M5441 Lumbago with sciatica, right side: Secondary | ICD-10-CM | POA: Diagnosis not present

## 2022-07-08 DIAGNOSIS — L308 Other specified dermatitis: Secondary | ICD-10-CM

## 2022-07-08 DIAGNOSIS — Z853 Personal history of malignant neoplasm of breast: Secondary | ICD-10-CM | POA: Diagnosis not present

## 2022-07-08 DIAGNOSIS — D75839 Thrombocytosis, unspecified: Secondary | ICD-10-CM | POA: Diagnosis not present

## 2022-07-08 DIAGNOSIS — G4733 Obstructive sleep apnea (adult) (pediatric): Secondary | ICD-10-CM | POA: Diagnosis not present

## 2022-07-08 DIAGNOSIS — I700 Atherosclerosis of aorta: Secondary | ICD-10-CM | POA: Diagnosis not present

## 2022-07-08 DIAGNOSIS — Z23 Encounter for immunization: Secondary | ICD-10-CM

## 2022-07-08 DIAGNOSIS — I10 Essential (primary) hypertension: Secondary | ICD-10-CM | POA: Diagnosis not present

## 2022-07-08 DIAGNOSIS — E782 Mixed hyperlipidemia: Secondary | ICD-10-CM

## 2022-07-08 DIAGNOSIS — R7303 Prediabetes: Secondary | ICD-10-CM | POA: Diagnosis not present

## 2022-07-08 DIAGNOSIS — Z6838 Body mass index (BMI) 38.0-38.9, adult: Secondary | ICD-10-CM

## 2022-07-08 DIAGNOSIS — G8929 Other chronic pain: Secondary | ICD-10-CM

## 2022-07-08 MED ORDER — OLMESARTAN MEDOXOMIL 20 MG PO TABS: 20.0000 mg | ORAL_TABLET | Freq: Every day | ORAL | 1 refills | Status: DC

## 2022-07-08 MED ORDER — ESOMEPRAZOLE MAGNESIUM 40 MG PO CPDR: DELAYED_RELEASE_CAPSULE | ORAL | 1 refills | Status: DC

## 2022-07-08 MED ORDER — ATORVASTATIN CALCIUM 10 MG PO TABS: 10.0000 mg | ORAL_TABLET | Freq: Every day | ORAL | 1 refills | Status: DC

## 2022-07-08 MED ORDER — HYDROCHLOROTHIAZIDE 25 MG PO TABS: 25.0000 mg | ORAL_TABLET | Freq: Every day | ORAL | 1 refills | Status: DC

## 2022-07-08 NOTE — Assessment & Plan Note (Signed)
Encourage diet and exercise for weight loss 

## 2022-07-08 NOTE — Assessment & Plan Note (Addendum)
Encourage weight loss as this can help reduce sleep apnea symptoms

## 2022-07-08 NOTE — Assessment & Plan Note (Signed)
C-Met and lipid profile today Encouraged her to consume low-fat diet Continue atorvastatin and aspirin

## 2022-07-08 NOTE — Assessment & Plan Note (Signed)
Encourage regular stretching and core strengthening Continue Tylenol, tramadol and methocarbamol as needed

## 2022-07-08 NOTE — Assessment & Plan Note (Signed)
Controlled on olmesartan and HCTZ, refilled today Reinforced DASH diet and exercise for weight loss C-Met today

## 2022-07-08 NOTE — Patient Instructions (Signed)

## 2022-07-08 NOTE — Assessment & Plan Note (Signed)
A1c today Encourage low-carb diet and exercise for weight loss

## 2022-07-08 NOTE — Assessment & Plan Note (Signed)
Continue betamethasone as needed

## 2022-07-08 NOTE — Progress Notes (Signed)
Subjective:    Patient ID: Miranda Mueller, female    DOB: [birth year], 81 y.o.   MRN: [id number]  HPI  Patient presents to clinic today for follow-up of chronic conditions.  History of Breast Cancer: In remission.  She gets yearly mammograms.  GERD: She is not sure what triggers this.  She denies breakthrough on Esomeprazole.  Upper GI from 10/2003 reviewed.  HLD with Aortic Atherosclerosis.  Her last LDL was 76, triglycerides 146, 06/2021.  She denies myalgias on Atorvastatin.  She is taking aspirin as well.  She tries to consume a low-fat diet.  HTN: Her BP today is 126/80.  She is taking Olmesartan, HCTZ and Potassium as prescribed.  ECG from 01/2022 reviewed.  OSA: She averages hours of sleep per night without the use of her CPAP.  There is no sleep study on file.  Chronic Back Pain: Managed with Tylenol, Tramadol and Methocarbamol as needed.  She no longer follows with physiatry or orthopedics.  Eczema: Managed with Betamethasone cream.  She does not follow with dermatology.  Prediabetes: Her last A1c was 5.6%, 06/2021.  She does not check her sugars.  She is not taking any oral diabetic medication at this time.  Thrombocytosis: Her last platelet count was 420, 01/2022.  She does not follow with hematology.  Review of Systems     Past Medical History:  Diagnosis Date   Bradycardia    Breast cancer ([place]) 08/29/14   Right Breast -High Grade Invasive Mammary   Cataract    surgery   GERD (gastroesophageal reflux disease)    History of shingles    Hx of radiation therapy 10/01/14- 11/20/14   right breast/50.4 Gy/28 fx; right breast boost/10 Gy/5 fx   Hyperlipidemia    Hypertension    IBS (irritable bowel syndrome)    Multifocal PVCs    PVC (premature ventricular contraction)    Hx of   Sleep apnea    no cpap per pt     Current Outpatient Medications  Medication Sig Dispense Refill   acetaminophen (TYLENOL) 500 MG tablet Take 500 mg by mouth every 6 (six) hours as needed.      aspirin 81 MG tablet Take 81 mg by mouth daily.     atorvastatin (LIPITOR) 10 MG tablet TAKE 1 TABLET DAILY 90 tablet 0   benzonatate (TESSALON) 100 MG capsule Take 1 capsule (100 mg total) by mouth 3 (three) times daily as needed for cough. 30 capsule 1   betamethasone dipropionate 0.05 % cream APPLY TOPICALLY AS NEEDED 30 g 11   cycloSPORINE (RESTASIS) 0.05 % ophthalmic emulsion Place 1 drop into both eyes 2 (two) times daily.       esomeprazole (NEXIUM) 40 MG capsule TAKE 1 CAPSULE DAILY AT 12 NOON 90 capsule 0   fexofenadine (ALLEGRA) 180 MG tablet Take 180 mg by mouth daily.     fluticasone (FLONASE) 50 MCG/ACT nasal spray TAKE 2 SPRAYS INTO BOTH NOSTRILS DAILY 16 g 4   hydrochlorothiazide (HYDRODIURIL) 25 MG tablet TAKE 1 TABLET DAILY 90 tablet 1   methocarbamol (ROBAXIN) 500 MG tablet TAKE 1 TABLET DAILY AS NEEDED FOR MUSCLE SPASMS 90 tablet 0   Multiple Vitamins-Minerals (HAIR/SKIN/NAILS PO) Take 1 tablet by mouth daily.     olmesartan (BENICAR) 20 MG tablet TAKE 1 TABLET DAILY 90 tablet 1   polycarbophil (FIBERCON) 625 MG tablet Take 1 tablet (625 mg total) by mouth 2 (two) times daily. 60 tablet 0   polyethylene glycol (MIRALAX) 17  g packet Take 17 g by mouth daily as needed for moderate constipation. 14 each 0   traMADol (ULTRAM) 50 MG tablet Take 1-2 tablets (50-100 mg total) by mouth every 6 (six) hours as needed for moderate pain. 20 tablet 0   Zinc 50 MG CAPS Take by mouth.     No current facility-administered medications for this visit.    No Known Allergies  Family History  Problem Relation Age of Onset   Hypertension Mother    Heart disease Mother        CAD   Breast cancer Mother 57   Hypertension Sister    Breast cancer Sister 36   Aneurysm Sister    Hypertension Brother    Prostate cancer Brother 55   Leukemia Maternal Uncle 23   Heart disease Maternal Grandmother    Leukemia Maternal Uncle 66   Breast cancer Cousin        dx under 56   Colon cancer  Cousin        dx in her 18s   Colon cancer Cousin    Lymphoma Other 70       NHL   Colon polyps Daughter    Stroke Father    Esophageal cancer Neg Hx    Stomach cancer Neg Hx    Rectal cancer Neg Hx     Social History   Socioeconomic History   Marital status: Married    Spouse name: Homer   Number of children: 1   Years of education: 12   Highest education level: Not on file  Occupational History    Comment: retired  Tobacco Use   Smoking status: Never   Smokeless tobacco: Never  Vaping Use   Vaping Use: Never used  Substance and Sexual Activity   Alcohol use: No    Alcohol/week: 0.0 standard drinks of alcohol   Drug use: No   Sexual activity: Never  Other Topics Concern   Not on file  Social History Narrative   Consumes one glass of caffeine daily   Social Determinants of Health   Financial Resource Strain: Not on file  Food Insecurity: Not on file  Transportation Needs: Not on file  Physical Activity: Not on file  Stress: Not on file  Social Connections: Not on file  Intimate Partner Violence: Not on file     Constitutional: Denies fever, malaise, fatigue, headache or abrupt weight changes.  HEENT: Denies eye pain, eye redness, ear pain, ringing in the ears, wax buildup, runny nose, nasal congestion, bloody nose, or sore throat. Respiratory: Denies difficulty breathing, shortness of breath, cough or sputum production.   Cardiovascular: Denies chest pain, chest tightness, palpitations or swelling in the hands or feet.  Gastrointestinal: Denies abdominal pain, bloating, constipation, diarrhea or blood in the stool.  GU: Denies urgency, frequency, pain with urination, burning sensation, blood in urine, odor or discharge. Musculoskeletal: Patient reports chronic back pain.  Denies decrease in range of motion, difficulty with gait, or joint swelling.  Skin: Patient reports dry skin.  Denies redness, rashes, lesions or ulcercations.  Neurological: Patient reports  intermittent paresthesias of lower extremities.  Denies dizziness, difficulty with memory, difficulty with speech or problems with balance and coordination.  Psych: Denies anxiety, depression, SI/HI.  No other specific complaints in a complete review of systems (except as listed in HPI above).  Objective:   Physical Exam  BP 126/80 (BP Location: Left Arm, Patient Position: Sitting, Cuff Size: Large)   Pulse 69   Temp Marland Kitchen)  97.1 F (36.2 C) (Temporal)   Ht 5' 6.5" (1.689 m)   Wt 244 lb (110.7 kg)   SpO2 99%   BMI 38.79 kg/m   Wt Readings from Last 3 Encounters:  02/23/22 242 lb (109.8 kg)  01/23/22 243 lb (110.2 kg)  01/18/22 242 lb (109.8 kg)    General: Appears her stated age, obese, in NAD. Skin: Warm, dry and intact.  HEENT: Head: normal shape and size; Eyes: sclera white, no icterus, conjunctiva pink, PERRLA and EOMs intact;  Cardiovascular: Normal rate and rhythm. S1,S2 noted.  No murmur, rubs or gallops noted. No JVD or BLE edema. No carotid bruits noted. Pulmonary/Chest: Normal effort and positive vesicular breath sounds. No respiratory distress. No wheezes, rales or ronchi noted.  Abdomen: Soft and nontender. Normal bowel sounds.  Musculoskeletal: No difficulty with gait.  Neurological: Alert and oriented. Cranial nerves II-XII grossly intact. Coordination normal.    BMET    Component Value Date/Time   NA 137 01/23/2022 1429   NA 138 08/22/2014 1237   K 3.9 01/23/2022 1429   K 3.8 08/22/2014 1237   CL 101 01/23/2022 1429   CO2 22 01/23/2022 1429   CO2 28 08/22/2014 1237   GLUCOSE 97 01/23/2022 1429   GLUCOSE 102 08/22/2014 1237   BUN 15 01/23/2022 1429   BUN 16.0 08/22/2014 1237   CREATININE 0.85 01/23/2022 1429   CREATININE 1.1 08/22/2014 1237   CALCIUM 9.9 01/23/2022 1429   CALCIUM 9.8 08/22/2014 1237   GFRNONAA 48 (L) 01/18/2022 1620   GFRAA 72 12/01/2007 1105    Lipid Panel     Component Value Date/Time   CHOL 146 07/03/2021 1037   TRIG 85  11/25/2021 0013   HDL 45 (L) 07/03/2021 1037   CHOLHDL 3.2 07/03/2021 1037   VLDL 29.8 07/02/2020 1009   LDLCALC 76 07/03/2021 1037    CBC    Component Value Date/Time   WBC 8.8 01/18/2022 1620   RBC 4.45 01/18/2022 1620   HGB 12.6 01/18/2022 1620   HGB 13.9 08/22/2014 1237   HCT 38.7 01/18/2022 1620   HCT 42.7 08/22/2014 1237   PLT 420 (H) 01/18/2022 1620   PLT 345 08/22/2014 1237   MCV 87.0 01/18/2022 1620   MCV 88.7 08/22/2014 1237   MCH 28.3 01/18/2022 1620   MCHC 32.6 01/18/2022 1620   RDW 13.9 01/18/2022 1620   RDW 13.8 08/22/2014 1237   LYMPHSABS 2.9 08/22/2014 1237   MONOABS 0.8 08/22/2014 1237   EOSABS 0.4 08/22/2014 1237   BASOSABS 0.1 08/22/2014 1237    Hgb A1C Lab Results  Component Value Date   HGBA1C 5.6 07/03/2021            Assessment & Plan:    RTC in 6 months, follow-up chronic conditions Webb Silversmith, NP

## 2022-07-08 NOTE — Assessment & Plan Note (Signed)
Try to identify foods that trigger reflux and avoid them Urged weight loss as this can help reduce reflux symptoms Continue Esomeprazole, refilled today

## 2022-07-08 NOTE — Assessment & Plan Note (Signed)
Continue yearly mammograms

## 2022-07-08 NOTE — Assessment & Plan Note (Signed)
C-Met and lipid profile today Encourage her to consume low-fat diet Continue atorvastatin, refilled today

## 2022-07-08 NOTE — Assessment & Plan Note (Signed)
CBC today

## 2022-07-09 LAB — COMPLETE METABOLIC PANEL WITH GFR
AG Ratio: 1.4 (calc) (ref 1.0–2.5)
ALT: 24 U/L (ref 6–29)
AST: 21 U/L (ref 10–35)
Albumin: 4.2 g/dL (ref 3.6–5.1)
Alkaline phosphatase (APISO): 58 U/L (ref 37–153)
BUN: 15 mg/dL (ref 7–25)
CO2: 21 mmol/L (ref 20–32)
Calcium: 9.7 mg/dL (ref 8.6–10.4)
Chloride: 100 mmol/L (ref 98–110)
Creat: 0.92 mg/dL (ref 0.60–0.95)
Globulin: 3.1 g/dL (calc) (ref 1.9–3.7)
Glucose, Bld: 101 mg/dL — ABNORMAL HIGH (ref 65–99)
Potassium: 4.1 mmol/L (ref 3.5–5.3)
Sodium: 135 mmol/L (ref 135–146)
Total Bilirubin: 0.8 mg/dL (ref 0.2–1.2)
Total Protein: 7.3 g/dL (ref 6.1–8.1)
eGFR: 63 mL/min/{1.73_m2} (ref >=60)

## 2022-07-09 LAB — LIPID PANEL
Cholesterol: 143 mg/dL (ref <200)
HDL: 49 mg/dL — ABNORMAL LOW (ref >=50)
LDL Cholesterol (Calc): 73 mg/dL (calc)
Non-HDL Cholesterol (Calc): 94 mg/dL (calc) (ref <130)
Total CHOL/HDL Ratio: 2.9 (calc) (ref <5.0)
Triglycerides: 129 mg/dL (ref <150)

## 2022-07-09 LAB — CBC
HCT: 39.5 % (ref 35.0–45.0)
Hemoglobin: 13.6 g/dL (ref 11.7–15.5)
MCH: 29.7 pg (ref 27.0–33.0)
MCHC: 34.4 g/dL (ref 32.0–36.0)
MCV: 86.2 fL (ref 80.0–100.0)
MPV: 11.1 fL (ref 7.5–12.5)
Platelets: 371 10*3/uL (ref 140–400)
RBC: 4.58 10*6/uL (ref 3.80–5.10)
RDW: 12.6 % (ref 11.0–15.0)
WBC: 5.7 10*3/uL (ref 3.8–10.8)

## 2022-07-09 LAB — HEMOGLOBIN A1C
Hgb A1c MFr Bld: 5.5 % of total Hgb (ref <5.7)
Mean Plasma Glucose: 111 mg/dL
eAG (mmol/L): 6.2 mmol/L

## 2022-08-18 ENCOUNTER — Telehealth: Payer: Self-pay

## 2022-08-18 NOTE — Telephone Encounter (Signed)
I left a message for the patient to call back and schedule their Medicare Annual Wellness Visit (AWV) virtually, by telephone, or face-to-face.   Miranda Mueller, [place] [phone]

## 2022-08-18 NOTE — Telephone Encounter (Signed)
I left a message for the patient to call back and schedule their Medicare Annual Wellness Visit (AWV) virtually, by telephone, or face-to-face.   Miranda Mueller, [place] [phone]

## 2022-08-24 ENCOUNTER — Ambulatory Visit (INDEPENDENT_AMBULATORY_CARE_PROVIDER_SITE_OTHER): Payer: Medicare Other

## 2022-08-24 DIAGNOSIS — Z0000 Encounter for general adult medical examination without abnormal findings: Secondary | ICD-10-CM

## 2022-08-24 DIAGNOSIS — H16223 Keratoconjunctivitis sicca, not specified as Sjogren's, bilateral: Secondary | ICD-10-CM | POA: Diagnosis not present

## 2022-08-24 NOTE — Patient Instructions (Signed)

## 2022-08-24 NOTE — Progress Notes (Addendum)
I connected with  Miranda Mueller on 08/24/22 by a audio enabled telemedicine application and verified that I am speaking with the correct person using two identifiers.  Patient Location: Home  Provider Location: Office/Clinic  I discussed the limitations of evaluation and management by telemedicine. The patient expressed understanding and agreed to proceed.   Subjective:   Miranda Mueller is a 81 y.o. female who presents for Medicare Annual (Subsequent) preventive examination.  Review of Systems     Per HPI unless specifically indicated below.  Cardiac Risk Factors include: advanced age (>61mn, >>2women);female gender          Objective:       07/08/2022    9:19 AM 02/23/2022    9:20 AM 02/04/2022   11:47 AM  Vitals with BMI  Height 5' 6.5"    Weight 244 lbs 242 lbs   BMI 344.9   Systolic [id number] Diastolic 80 80 85  Pulse 69 76 105    There were no vitals filed for this visit. There is no height or weight on file to calculate BMI.     08/24/2022    1:50 PM 01/18/2022    4:01 PM 11/25/2021    9:52 PM 02/01/2017    8:28 AM 01/03/2016    2:02 PM 07/22/2015    7:35 AM 01/15/2015   10:05 AM  Advanced Directives  Does Patient Have a Medical Advance Directive? Yes Yes Yes No Yes Yes Yes  Type of [place]  [place] of facility DNR (pink MOST or yellow form)  Living will;Healthcare Power of Attorney Living will   Does patient want to make changes to medical advance directive? No - Patient declined  No - Patient declined      Copy of [place] Chart? No - copy requested  No - copy requested  No - copy requested  No - copy requested  Would patient like information on creating a medical advance directive?   No - Patient declined        Current Medications (verified) Outpatient Encounter Medications as of 08/24/2022  Medication Sig   acetaminophen (TYLENOL) 500 MG tablet Take 500  mg by mouth every 6 (six) hours as needed.   aspirin 81 MG tablet Take 81 mg by mouth daily.   atorvastatin (LIPITOR) 10 MG tablet Take 1 tablet (10 mg total) by mouth daily.   betamethasone dipropionate 0.05 % cream APPLY TOPICALLY AS NEEDED   cycloSPORINE (RESTASIS) 0.05 % ophthalmic emulsion Place 1 drop into both eyes 2 (two) times daily.   esomeprazole (NEXIUM) 40 MG capsule TAKE 1 CAPSULE DAILY AT 12 NOON   fexofenadine (ALLEGRA) 180 MG tablet Take 180 mg by mouth daily.   fluticasone (FLONASE) 50 MCG/ACT nasal spray TAKE 2 SPRAYS INTO BOTH NOSTRILS DAILY (Patient taking differently: 2 sprays as needed for allergies. TAKE 2 SPRAYS INTO BOTH NOSTRILS DAILY)   hydrochlorothiazide (HYDRODIURIL) 25 MG tablet Take 1 tablet (25 mg total) by mouth daily.   methocarbamol (ROBAXIN) 500 MG tablet TAKE 1 TABLET DAILY AS NEEDED FOR MUSCLE SPASMS   Multiple Vitamins-Minerals (HAIR/SKIN/NAILS PO) Take 1 tablet by mouth daily.   olmesartan (BENICAR) 20 MG tablet Take 1 tablet (20 mg total) by mouth daily.   polycarbophil (FIBERCON) 625 MG tablet Take 1 tablet (625 mg total) by mouth 2 (two) times daily. (Patient taking differently: Take 625 mg by mouth daily.)  Zinc 50 MG CAPS Take by mouth.   XIIDRA 5 % SOLN Apply 1 drop to eye 2 (two) times daily. (Patient not taking: Reported on 08/24/2022)   [DISCONTINUED] benzonatate (TESSALON) 100 MG capsule Take 1 capsule (100 mg total) by mouth 3 (three) times daily as needed for cough. (Patient not taking: Reported on 08/24/2022)   [DISCONTINUED] polyethylene glycol (MIRALAX) 17 g packet Take 17 g by mouth daily as needed for moderate constipation. (Patient not taking: Reported on 08/24/2022)   [DISCONTINUED] traMADol (ULTRAM) 50 MG tablet Take 1-2 tablets (50-100 mg total) by mouth every 6 (six) hours as needed for moderate pain. (Patient not taking: Reported on 08/24/2022)   No facility-administered encounter medications on file as of 08/24/2022.     Allergies (verified) Patient has no known allergies.   History: Past Medical History:  Diagnosis Date   Bradycardia    Breast cancer ([place]) 08/29/14   Right Breast -High Grade Invasive Mammary   Cataract    surgery   GERD (gastroesophageal reflux disease)    History of shingles    Hx of radiation therapy 10/01/14- 11/20/14   right breast/50.4 Gy/28 fx; right breast boost/10 Gy/5 fx   Hyperlipidemia    Hypertension    IBS (irritable bowel syndrome)    Multifocal PVCs    PVC (premature ventricular contraction)    Hx of   Sleep apnea    no cpap per pt    Past Surgical History:  Procedure Laterality Date   BREAST LUMPECTOMY Right 08/29/14   started radiation 10/01/14   CATARACT EXTRACTION  2009   bilateral   CHOLECYSTECTOMY N/A 11/28/2021   Procedure: LAPAROSCOPIC CHOLECYSTECTOMY WITH INTRAOPERATIVE CHOLANGIOGRAM, LIVER BIOPSY;  Surgeon: Michael Boston, MD;  Location: [facility];  Service: General;  Laterality: N/A;   COLONOSCOPY  2004-last 2016   normal   KNEE ARTHROSCOPY     right   NEEDLE CORE BIOPSY  RIGHT BREAST Right 08/14/14   OTHER SURGICAL HISTORY Left 1997   breast,cyst   PYLOROPLASTY  2016   RADIOACTIVE SEED GUIDED PARTIAL MASTECTOMY WITH AXILLARY SENTINEL LYMPH NODE BIOPSY Right 08/29/2014   Procedure: SEED LOCALIZED RIGHT BREAST LUMPECTOMY AND RIGHT AXILLARY SENTTINEL LYMPH NODE BIOPSY;  Surgeon: Excell Seltzer, MD;  Location: [place];  Service: General;  Laterality: Right;   WISDOM TOOTH EXTRACTION     Family History  Problem Relation Age of Onset   Hypertension Mother    Heart disease Mother        CAD   Breast cancer Mother 40   Hypertension Sister    Breast cancer Sister 18   Aneurysm Sister    Hypertension Brother    Prostate cancer Brother 60   Leukemia Maternal Uncle 4   Heart disease Maternal Grandmother    Leukemia Maternal Uncle 66   Breast cancer Cousin        dx under 73   Colon cancer Cousin        dx in her 58s    Colon cancer Cousin    Lymphoma Other 61       NHL   Colon polyps Daughter    Stroke Father    Esophageal cancer Neg Hx    Stomach cancer Neg Hx    Rectal cancer Neg Hx    Social History   Socioeconomic History   Marital status: Married    Spouse name: Miranda Mueller   Number of children: 1   Years of education: 12   Highest education level:  Not on file  Occupational History    Comment: retired  Tobacco Use   Smoking status: Never   Smokeless tobacco: Never  Vaping Use   Vaping Use: Never used  Substance and Sexual Activity   Alcohol use: No    Alcohol/week: 0.0 standard drinks of alcohol   Drug use: No   Sexual activity: Never  Other Topics Concern   Not on file  Social History Narrative   Consumes one glass of caffeine daily   Social Determinants of Health   Financial Resource Strain: Low Risk  (08/24/2022)   Overall Financial Resource Strain (CARDIA)    Difficulty of Paying Living Expenses: Not hard at all  Food Insecurity: No Food Insecurity (08/24/2022)   Hunger Vital Sign    Worried About Running Out of Food in the Last Year: Never true    Ran Out of Food in the Last Year: Never true  Transportation Needs: No Transportation Needs (08/24/2022)   PRAPARE - Hydrologist (Medical): No    Lack of Transportation (Non-Medical): No  Physical Activity: Insufficiently Active (08/24/2022)   Exercise Vital Sign    Days of Exercise per Week: 7 days    Minutes of Exercise per Session: 10 min  Stress: No Stress Concern Present (08/24/2022)   [place]    Feeling of Stress : Not at all  Social Connections: [place] (08/24/2022)   Social Connection and Isolation Panel [NHANES]    Frequency of Communication with Friends and Family: More than three times a week    Frequency of Social Gatherings with Friends and Family: More than three times a week    Attends Religious  Services: More than 4 times per year    Active Member of [employer] or Organizations: Yes    Attends Music therapist: More than 4 times per year    Marital Status: Married    Tobacco Counseling Counseling given: Not Answered   Clinical Intake:     Pain : No/denies pain     Nutritional Status: BMI 25 -29 Overweight Nutritional Risks: None Diabetes: No  How often do you need to have someone help you when you read instructions, pamphlets, or other written materials from your doctor or pharmacy?: 1 - Never  Diabetic?No  Interpreter Needed?: No  Information entered by :: Donnie Mesa, CMA   Activities of Daily Living    08/24/2022    1:24 PM 02/23/2022    9:49 AM  In your present state of health, do you have any difficulty performing the following activities:  Hearing? 0 0  Vision? 1 0  Difficulty concentrating or making decisions? 0 0  Walking or climbing stairs? 0 1  Dressing or bathing? 0 0  Doing errands, shopping? 0 0    Patient Care Team: Jearld Fenton, NP as PCP - General (Internal Medicine) Nicholas Lose, MD as Consulting Physician (Hematology and Oncology) Excell Seltzer, MD (Inactive) as Consulting Physician (General Surgery) Eppie Gibson, MD as Attending Physician (Radiation Oncology) Holley Bouche, NP (Inactive) as Nurse Practitioner (Nurse Practitioner)  Indicate any recent Medical Services you may have received from other than [facility] providers in the past year (date may be approximate). The pt was seen on 01/18/22 at [facility] for hypokalemia.    Assessment:   This is a routine wellness examination for Reighlynn.  Hearing/Vision screen Denies any hearing issues. Annual Eye Exam done at [facility]  [place], [place], wear glasses. Denies any vision changes   Dietary issues and exercise activities discussed: Current Exercise Habits: Home exercise routine, Time (Minutes): 10, Frequency (Times/Week):  7, Weekly Exercise (Minutes/Week): 70, Intensity: Mild, Exercise limited by: orthopedic condition(s)   Goals Addressed             This Visit's Progress    Stay Active and Independent           Why is this important?   Regular activity or exercise is important to managing back pain.  Activity helps to keep your muscles strong.  You will sleep better and feel more relaxed.  You will have more energy and feel less stressed.  If you are not active now, start slowly. Little changes make a big difference.  Rest, but not too much.  Stay as active as you can and listen to your body's signals.            Depression Screen    08/24/2022    1:23 PM 02/23/2022    9:49 AM 01/23/2022    2:20 PM 12/05/2021    2:58 PM 07/03/2021   10:09 AM 07/02/2020    9:37 AM 03/28/2019    3:23 PM  PHQ 2/9 Scores  PHQ - 2 Score 0 0 0 0 0 0 0  PHQ- 9 Score  0 3 2 0  0    Fall Risk    08/24/2022    1:23 PM 02/23/2022    9:49 AM 01/23/2022    2:19 PM 12/05/2021    3:18 PM 07/03/2021   10:09 AM  Fall Risk   Falls in the past year? 0 0 0 0 0  Number falls in past yr: 0 0 0 0 0  Injury with Fall? 0 0 0 0 0  Risk for fall due to : No Fall Risks No Fall Risks No Fall Risks No Fall Risks No Fall Risks  Follow up Falls evaluation completed Falls evaluation completed Falls evaluation completed Falls evaluation completed Falls evaluation completed    FALL RISK PREVENTION PERTAINING TO THE HOME:  Any stairs in or around the home? No  If so, are there any without handrails? No  Home free of loose throw rugs in walkways, pet beds, electrical cords, etc? Yes  Adequate lighting in your home to reduce risk of falls? Yes   ASSISTIVE DEVICES UTILIZED TO PREVENT FALLS:  Life alert? No  Use of a cane, walker or w/c? Yes  Grab bars in the bathroom? Yes  Shower chair or bench in shower? No  Elevated toilet seat or a handicapped toilet? Yes   TIMED UP AND GO:  Was the test performed? No .  Cognitive  Function:        08/24/2022    1:26 PM  6CIT Screen  What Year? 0 points  What month? 0 points  What time? 0 points  Count back from 20 0 points  Months in reverse 0 points  Repeat phrase 0 points  Total Score 0 points    Immunizations Immunization History  Administered Date(s) Administered   Fluad Quad(high Dose 65+) 07/17/2019, 07/02/2020, 07/03/2021, 07/08/2022   Influenza Split 07/13/2011, 07/25/2012   Influenza Whole 08/16/2002, 07/13/2007   Influenza, High Dose Seasonal PF 09/19/2018   Influenza,inj,Quad PF,6+ Mos 09/01/2013, 08/27/2015, 08/04/2016, 08/02/2017   Moderna Sars-Covid-2 Vaccination 12/09/2019, 01/06/2020   Pneumococcal Conjugate-13 08/27/2015   Pneumococcal Polysaccharide-23 02/17/2010   Td 08/16/2002, 04/19/2012   Zoster Recombinat (Shingrix) 03/04/2018, 05/23/2018  Zoster, Live 12/01/2007    TDAP status: Due, Education has been provided regarding the importance of this vaccine. Advised may receive this vaccine at local pharmacy or Health Dept. Aware to provide a copy of the vaccination record if obtained from local pharmacy or Health Dept. Verbalized acceptance and understanding.  Flu Vaccine status: Up to date  Pneumococcal vaccine status: Up to date  Covid-19 vaccine status: Information provided on how to obtain vaccines.   Qualifies for Shingles Vaccine? Yes   Zostavax completed Yes   Shingrix Completed?: Yes  Screening Tests Health Maintenance  Topic Date Due   COVID-19 Vaccine (3 - Moderna risk series) 02/03/2020   TETANUS/TDAP  04/19/2022   MAMMOGRAM  01/09/2023   Medicare Annual Wellness (AWV)  08/25/2023   Pneumonia Vaccine 90+ Years old  Completed   INFLUENZA VACCINE  Completed   DEXA SCAN  Completed   Zoster Vaccines- Shingrix  Completed   HPV VACCINES  Aged Out    Health Maintenance  Health Maintenance Due  Topic Date Due   COVID-19 Vaccine (3 - Moderna risk series) 02/03/2020   TETANUS/TDAP  04/19/2022    Colorectal  cancer screening: No longer required.   Mammogram status: No longer required due to age.  DEXA Scan: completed 03/17/2021  Lung Cancer Screening: (Low Dose CT Chest recommended if Age 61-80 years, 30 pack-year currently smoking OR have quit w/in 15years.) does not qualify.   Lung Cancer Screening Referral: does not qualify   Additional Screening:  Hepatitis C Screening: does qualify; Completed   Vision Screening: Recommended annual ophthalmology exams for early detection of glaucoma and other disorders of the eye. Is the patient up to date with their annual eye exam?  Yes  Who is the provider or what is the name of the office in which the patient attends annual eye exams?  [place]  If pt is not established with a provider, would they like to be referred to a provider to establish care? No .   Dental Screening: Recommended annual dental exams for proper oral hygiene  Community Resource Referral / Chronic Care Management: CRR required this visit?  No   CCM required this visit?  No      Plan:     I have personally reviewed and noted the following in the patient's chart:   Medical and social history Use of alcohol, tobacco or illicit drugs  Current medications and supplements including opioid prescriptions. Patient is not currently taking opioid prescriptions. Functional ability and status Nutritional status Physical activity Advanced directives List of other physicians Hospitalizations, surgeries, and ER visits in previous 12 months Vitals Screenings to include cognitive, depression, and falls Referrals and appointments  In addition, I have reviewed and discussed with patient certain preventive protocols, quality metrics, and best practice recommendations. A written personalized care plan for preventive services as well as general preventive health recommendations were provided to patient.     Wilson Singer, CMA   08/24/2022   Nurse Notes: Approximately 30  minute Non-Face- To-Face

## 2023-01-04 ENCOUNTER — Other Ambulatory Visit: Payer: Self-pay | Admitting: Internal Medicine

## 2023-01-04 DIAGNOSIS — I10 Essential (primary) hypertension: Secondary | ICD-10-CM

## 2023-01-05 NOTE — Telephone Encounter (Signed)
Requested medication (s) are due for refill today: yes  Requested medication (s) are on the active medication list: yes  Last refill:  04/16/22  Future visit scheduled: yes  Notes to clinic:  Unable to refill per protocol, cannot delegate.      Requested Prescriptions  Pending Prescriptions Disp Refills   methocarbamol (ROBAXIN) 500 MG tablet [Pharmacy Med Name: METHOCARBAMOL TABS 500MG ] 90 tablet 3    Sig: TAKE 1 TABLET DAILY AS NEEDED FOR MUSCLE SPASMS     Not Delegated - Analgesics:  Muscle Relaxants Failed - 01/04/2023  9:31 AM      Failed - This refill cannot be delegated      Passed - Valid encounter within last 6 months    Recent Outpatient Visits           6 months ago Aortic atherosclerosis [facility])   [facility] [place], Coralie Keens, NP   10 months ago Aortic atherosclerosis [facility])   [facility] [place], Coralie Keens, NP   11 months ago Hypokalemia   [facility] [place], [place] W, NP   1 year ago Acute biliary pancreatitis without infection or necrosis   [facility] [place], [place] W, NP   1 year ago Medicare annual wellness visit, subsequent   [facility] [place], Coralie Keens, NP       Future Appointments             Tomorrow Garnette Gunner, Coralie Keens, NP [facility]            Signed Prescriptions Disp Refills   olmesartan (BENICAR) 20 MG tablet 90 tablet 0    Sig: TAKE 1 TABLET DAILY     Cardiovascular:  Angiotensin Receptor Blockers Passed - 01/04/2023  9:31 AM      Passed - Cr in normal range and within 180 days    Creatinine  Date Value Ref Range Status  08/22/2014 1.1 0.6 - 1.1 mg/dL Final   Creat  Date Value Ref Range Status  07/08/2022 0.92 0.60 - 0.95 mg/dL Final         Passed - K in normal range and within 180 days    Potassium  Date Value Ref Range Status  07/08/2022 4.1 3.5 - 5.3 mmol/L Final   08/22/2014 3.8 3.5 - 5.1 mEq/L Final         Passed - Patient is not pregnant      Passed - Last BP in normal range    BP Readings from Last 1 Encounters:  07/08/22 126/80         Passed - Valid encounter within last 6 months    Recent Outpatient Visits           6 months ago Aortic atherosclerosis [facility])   [facility] Jearld Fenton, NP   10 months ago Aortic atherosclerosis [facility])   [facility] [place], Coralie Keens, NP   11 months ago Hypokalemia   [facility] [place], [place] W, NP   1 year ago Acute biliary pancreatitis without infection or necrosis   [facility] [place], Coralie Keens, NP   1 year ago Medicare annual wellness visit, subsequent   [facility] [place], Coralie Keens, NP       Future Appointments  Tomorrow Jearld Fenton, NP [facility]             esomeprazole (NEXIUM) 40 MG capsule 90 capsule 0    Sig: TAKE 1 CAPSULE DAILY AT 12 NOON     Gastroenterology: Proton Pump Inhibitors 2 Passed - 01/04/2023  9:31 AM      Passed - ALT in normal range and within 360 days    ALT  Date Value Ref Range Status  07/08/2022 24 6 - 29 U/L Final  08/22/2014 22 0 - 55 U/L Final         Passed - AST in normal range and within 360 days    AST  Date Value Ref Range Status  07/08/2022 21 10 - 35 U/L Final  08/22/2014 17 5 - 34 U/L Final         Passed - Valid encounter within last 12 months    Recent Outpatient Visits           6 months ago Aortic atherosclerosis [facility])   [facility] [place], Coralie Keens, NP   10 months ago Aortic atherosclerosis [facility])   [facility] [place], Coralie Keens, NP   11 months ago Hypokalemia   [facility] [place], [place] W, NP   1 year ago Acute biliary pancreatitis without infection or necrosis    [facility] [place], Coralie Keens, NP   1 year ago Medicare annual wellness visit, subsequent   [place], Coralie Keens, NP       Future Appointments             Tomorrow Garnette Gunner, Coralie Keens, NP [facility]             atorvastatin (LIPITOR) 10 MG tablet 90 tablet 0    Sig: TAKE 1 TABLET DAILY     Cardiovascular:  Antilipid - Statins Failed - 01/04/2023  9:31 AM      Failed - Lipid Panel in normal range within the last 12 months    Cholesterol  Date Value Ref Range Status  07/08/2022 143 <200 mg/dL Final   LDL Cholesterol (Calc)  Date Value Ref Range Status  07/08/2022 73 mg/dL (calc) Final    Comment:    Reference range: <100 . Desirable range <100 mg/dL for primary prevention;   <70 mg/dL for patients with CHD or diabetic patients  with > or = 2 CHD risk factors. Marland Kitchen LDL-C is now calculated using the Martin-Hopkins  calculation, which is a validated novel method providing  better accuracy than the Friedewald equation in the  estimation of LDL-C.  Cresenciano Genre et al. Annamaria Helling. [id number]): 2061-2068  (http://education.[employer].com/faq/FAQ164)    Direct LDL  Date Value Ref Range Status  07/18/2012 153.2 mg/dL Final    Comment:    Optimal:  <100 mg/dLNear or Above Optimal:  100-129 mg/dLBorderline High:  130-159 mg/dLHigh:  160-189 mg/dLVery High:  >190 mg/dL   HDL  Date Value Ref Range Status  07/08/2022 49 (L) > OR = 50 mg/dL Final   Triglycerides  Date Value Ref Range Status  07/08/2022 129 <150 mg/dL Final         Passed - Patient is not pregnant      Passed - Valid encounter within last 12 months    Recent Outpatient Visits  6 months ago Aortic atherosclerosis [facility])   [facility] [place], Coralie Keens, NP   10 months ago Aortic atherosclerosis [facility])   [facility] [place], Coralie Keens, [place]   11 months ago  Hypokalemia   [facility] [place], [place] W, NP   1 year ago Acute biliary pancreatitis without infection or necrosis   [facility] [place], Coralie Keens, NP   1 year ago Medicare annual wellness visit, subsequent   [place], Coralie Keens, NP       Future Appointments             Tomorrow Garnette Gunner, Coralie Keens, NP [facility], [facility]

## 2023-01-05 NOTE — Telephone Encounter (Signed)
Requested Prescriptions  Pending Prescriptions Disp Refills   olmesartan (BENICAR) 20 MG tablet [Pharmacy Med Name: OLMESARTAN MEDOXOMIL TABS 20MG ] 90 tablet 0    Sig: TAKE 1 TABLET DAILY     Cardiovascular:  Angiotensin Receptor Blockers Passed - 01/04/2023  9:31 AM      Passed - Cr in normal range and within 180 days    Creatinine  Date Value Ref Range Status  08/22/2014 1.1 0.6 - 1.1 mg/dL Final   Creat  Date Value Ref Range Status  07/08/2022 0.92 0.60 - 0.95 mg/dL Final         Passed - K in normal range and within 180 days    Potassium  Date Value Ref Range Status  07/08/2022 4.1 3.5 - 5.3 mmol/L Final  08/22/2014 3.8 3.5 - 5.1 mEq/L Final         Passed - Patient is not pregnant      Passed - Last BP in normal range    BP Readings from Last 1 Encounters:  07/08/22 126/80         Passed - Valid encounter within last 6 months    Recent Outpatient Visits           6 months ago Aortic atherosclerosis [facility])   [facility] [place], Coralie Keens, NP   10 months ago Aortic atherosclerosis [facility])   [facility] [place], Coralie Keens, NP   11 months ago Hypokalemia   [facility] [place], [place] W, NP   1 year ago Acute biliary pancreatitis without infection or necrosis   [facility] [place], Coralie Keens, NP   1 year ago Medicare annual wellness visit, subsequent   [place], Coralie Keens, NP       Future Appointments             Tomorrow Garnette Gunner, Coralie Keens, NP [facility]             esomeprazole ([place]) 40 MG capsule [Pharmacy Med Name: ESOMEPRAZOLE MAGNESIUM DR CAPS 40MG ] 90 capsule 0    Sig: TAKE 1 CAPSULE DAILY AT 12 NOON     Gastroenterology: Proton Pump Inhibitors 2 Passed - 01/04/2023  9:31 AM      Passed - ALT in normal range and within 360 days    ALT  Date Value Ref Range Status  07/08/2022 24 6  - 29 U/L Final  08/22/2014 22 0 - 55 U/L Final         Passed - AST in normal range and within 360 days    AST  Date Value Ref Range Status  07/08/2022 21 10 - 35 U/L Final  08/22/2014 17 5 - 34 U/L Final         Passed - Valid encounter within last 12 months    Recent Outpatient Visits           6 months ago Aortic atherosclerosis [facility])   [facility] [place], Coralie Keens, NP   10 months ago Aortic atherosclerosis [facility])   [facility] [place], Coralie Keens, NP   11 months ago Hypokalemia   [facility] [place], [place] W, NP   1 year ago Acute biliary pancreatitis without infection or necrosis   [facility] [place], Coralie Keens, NP   1  year ago Medicare annual wellness visit, subsequent   [place], NP       Future Appointments             Tomorrow Garnette Gunner, Coralie Keens, NP [facility]             atorvastatin (LIPITOR) 10 MG tablet [Pharmacy Med Name: ATORVASTATIN TABS 10MG ] 90 tablet 0    Sig: TAKE 1 TABLET DAILY     Cardiovascular:  Antilipid - Statins Failed - 01/04/2023  9:31 AM      Failed - Lipid Panel in normal range within the last 12 months    Cholesterol  Date Value Ref Range Status  07/08/2022 143 <200 mg/dL Final   LDL Cholesterol (Calc)  Date Value Ref Range Status  07/08/2022 73 mg/dL (calc) Final    Comment:    Reference range: <100 . Desirable range <100 mg/dL for primary prevention;   <70 mg/dL for patients with CHD or diabetic patients  with > or = 2 CHD risk factors. Marland Kitchen LDL-C is now calculated using the Martin-Hopkins  calculation, which is a validated novel method providing  better accuracy than the Friedewald equation in the  estimation of LDL-C.  Cresenciano Genre et al. Annamaria Helling. [id number]): 2061-2068  (http://education.[facility].com/faq/FAQ164)    Direct LDL  Date Value  Ref Range Status  07/18/2012 153.2 mg/dL Final    Comment:    Optimal:  <100 mg/dLNear or Above Optimal:  100-129 mg/dLBorderline High:  130-159 mg/dLHigh:  160-189 mg/dLVery High:  >190 mg/dL   HDL  Date Value Ref Range Status  07/08/2022 49 (L) > OR = 50 mg/dL Final   Triglycerides  Date Value Ref Range Status  07/08/2022 129 <150 mg/dL Final         Passed - Patient is not pregnant      Passed - Valid encounter within last 12 months    Recent Outpatient Visits           6 months ago Aortic atherosclerosis [facility])   [facility] [place], Coralie Keens, NP   10 months ago Aortic atherosclerosis [facility])   [facility] [place], Coralie Keens, NP   11 months ago Hypokalemia   [facility] [place], [place], NP   1 year ago Acute biliary pancreatitis without infection or necrosis   [facility] [place], Coralie Keens, NP   1 year ago Medicare annual wellness visit, subsequent   [place], Coralie Keens, NP       Future Appointments             Tomorrow Garnette Gunner, Coralie Keens, NP [facility]             methocarbamol (ROBAXIN) 500 MG tablet [Pharmacy Med Name: METHOCARBAMOL TABS 500MG ] 90 tablet 3    Sig: TAKE 1 TABLET DAILY AS NEEDED FOR MUSCLE SPASMS     Not Delegated - Analgesics:  Muscle Relaxants Failed - 01/04/2023  9:31 AM      Failed - This refill cannot be delegated      Passed - Valid encounter within last 6 months    Recent Outpatient Visits           6 months ago Aortic atherosclerosis [facility])   [facility] [place], Coralie Keens, [place]  10 months ago Aortic atherosclerosis [facility])   [facility] [place], Coralie Keens, [place]   11 months ago Hypokalemia   [facility] [place], [place], NP   1 year ago Acute biliary pancreatitis without infection or necrosis    [facility] [place], Coralie Keens, NP   1 year ago Medicare annual wellness visit, subsequent   [place], Coralie Keens, NP       Future Appointments             Tomorrow Garnette Gunner, Coralie Keens, NP [facility]

## 2023-01-06 ENCOUNTER — Encounter: Payer: Self-pay | Admitting: Internal Medicine

## 2023-01-06 ENCOUNTER — Ambulatory Visit (INDEPENDENT_AMBULATORY_CARE_PROVIDER_SITE_OTHER): Payer: Medicare Other | Admitting: Internal Medicine

## 2023-01-06 VITALS — BP 118/80 | HR 85 | Temp 96.9°F | Wt 247.0 lb

## 2023-01-06 DIAGNOSIS — L308 Other specified dermatitis: Secondary | ICD-10-CM

## 2023-01-06 DIAGNOSIS — N1831 Chronic kidney disease, stage 3a: Secondary | ICD-10-CM

## 2023-01-06 DIAGNOSIS — M5441 Lumbago with sciatica, right side: Secondary | ICD-10-CM | POA: Diagnosis not present

## 2023-01-06 DIAGNOSIS — K219 Gastro-esophageal reflux disease without esophagitis: Secondary | ICD-10-CM

## 2023-01-06 DIAGNOSIS — G4733 Obstructive sleep apnea (adult) (pediatric): Secondary | ICD-10-CM

## 2023-01-06 DIAGNOSIS — R7303 Prediabetes: Secondary | ICD-10-CM

## 2023-01-06 DIAGNOSIS — I10 Essential (primary) hypertension: Secondary | ICD-10-CM

## 2023-01-06 DIAGNOSIS — Z853 Personal history of malignant neoplasm of breast: Secondary | ICD-10-CM

## 2023-01-06 DIAGNOSIS — E782 Mixed hyperlipidemia: Secondary | ICD-10-CM | POA: Diagnosis not present

## 2023-01-06 DIAGNOSIS — I700 Atherosclerosis of aorta: Secondary | ICD-10-CM

## 2023-01-06 DIAGNOSIS — Z6839 Body mass index (BMI) 39.0-39.9, adult: Secondary | ICD-10-CM

## 2023-01-06 DIAGNOSIS — G8929 Other chronic pain: Secondary | ICD-10-CM | POA: Diagnosis not present

## 2023-01-06 MED ORDER — ESOMEPRAZOLE MAGNESIUM 40 MG PO CPDR: 40.0000 mg | DELAYED_RELEASE_CAPSULE | Freq: Every day | ORAL | 1 refills | Status: DC

## 2023-01-06 MED ORDER — ATORVASTATIN CALCIUM 10 MG PO TABS: 10.0000 mg | ORAL_TABLET | Freq: Every day | ORAL | 1 refills | Status: DC

## 2023-01-06 NOTE — Assessment & Plan Note (Signed)
Encourage weight loss as this can produce sleep apnea symptoms Noncompliant with CPAP

## 2023-01-06 NOTE — Assessment & Plan Note (Signed)
C-Met and lipid profile today Encouraged her to consume low-fat diet Continue atorvastatin 

## 2023-01-06 NOTE — Assessment & Plan Note (Signed)
C-Met today Continue olmesartan for renal protection 

## 2023-01-06 NOTE — Assessment & Plan Note (Signed)
A1c today Encouraged her consume a low carb diet and exercise for weight loss

## 2023-01-06 NOTE — Assessment & Plan Note (Signed)
Encourage regular stretching and core strengthening Continue Tylenol, tramadol and methocarbamol as needed 

## 2023-01-06 NOTE — Assessment & Plan Note (Signed)
Continue yearly mammograms 

## 2023-01-06 NOTE — Assessment & Plan Note (Signed)
C-Met and lipid profile today Encouraged her to consume low-fat diet Continue atorvastatin and aspirin 

## 2023-01-06 NOTE — Assessment & Plan Note (Signed)
Encourage diet and exercise for weight loss 

## 2023-01-06 NOTE — Patient Instructions (Signed)

## 2023-01-06 NOTE — Assessment & Plan Note (Addendum)
Avoid foods that trigger reflux Encouraged weight loss as this can help reduce reflux symptoms Continue esomeprazole

## 2023-01-06 NOTE — Assessment & Plan Note (Signed)
Continue betamethasone cream as needed

## 2023-01-06 NOTE — Progress Notes (Signed)
Subjective:    Patient ID: Miranda Mueller, female    DOB: October 01, 1941, 82 y.o.   MRN: [id number]  HPI  Patient presents to clinic today for 54-month follow-up of chronic conditions.  History of Breast Cancer: In remission.  She gets yearly mammograms.  She no longer follows with oncology.  GERD: She is not sure what triggers this.  She denies breakthrough on Esomeprazole.  Upper GI from 10/2003 reviewed.  HLD with Aortic Atherosclerosis: Her last LDL was 73, triglycerides 129, 06/2022.  She denies myalgias on Atorvastatin.  She is taking Aspirin as well.  She tries to consume a low-fat diet.  HTN: Her BP today is 118/80.  She is taking Olmesartan, HCTZ and Potassium as prescribed.  ECG from 01/2022 reviewed.  OSA: She averages 8  hours of sleep per night without the use of her CPAP.  There is no sleep study on file.  Chronic Back Pain: Managed with Tylenol, Tramadol and Methocarbamol as needed.  She no longer follows with physiatry or orthopedics.  Eczema: Managed with Betamethasone cream.  She does not follow with dermatology.  Prediabetes: Her last A1c was 5.5%, 06/2022.  She does not check her sugars.  She is not taking any oral diabetic medication at this time.  CKD 3: Her last creatinine was 1.98, GFR 48, 06/2022.  She is on Olmesartan for renal protection.  She does not follow with nephrology.  Review of Systems     Past Medical History:  Diagnosis Date   Bradycardia    Breast cancer ([place]) 08/29/14   Right Breast -High Grade Invasive Mammary   Cataract    surgery   GERD (gastroesophageal reflux disease)    History of shingles    Hx of radiation therapy 10/01/14- 11/20/14   right breast/50.4 Gy/28 fx; right breast boost/10 Gy/5 fx   Hyperlipidemia    Hypertension    IBS (irritable bowel syndrome)    Multifocal PVCs    PVC (premature ventricular contraction)    Hx of   Sleep apnea    no cpap per pt     Current Outpatient Medications  Medication Sig Dispense Refill    acetaminophen (TYLENOL) 500 MG tablet Take 500 mg by mouth every 6 (six) hours as needed.     aspirin 81 MG tablet Take 81 mg by mouth daily.     atorvastatin (LIPITOR) 10 MG tablet TAKE 1 TABLET DAILY 90 tablet 0   betamethasone dipropionate 0.05 % cream APPLY TOPICALLY AS NEEDED 30 g 11   cycloSPORINE (RESTASIS) 0.05 % ophthalmic emulsion Place 1 drop into both eyes 2 (two) times daily.     esomeprazole (NEXIUM) 40 MG capsule TAKE 1 CAPSULE DAILY AT 12 NOON 90 capsule 0   fexofenadine (ALLEGRA) 180 MG tablet Take 180 mg by mouth daily.     fluticasone (FLONASE) 50 MCG/ACT nasal spray TAKE 2 SPRAYS INTO BOTH NOSTRILS DAILY (Patient taking differently: 2 sprays as needed for allergies. TAKE 2 SPRAYS INTO BOTH NOSTRILS DAILY) 16 g 4   hydrochlorothiazide (HYDRODIURIL) 25 MG tablet Take 1 tablet (25 mg total) by mouth daily. 90 tablet 1   methocarbamol (ROBAXIN) 500 MG tablet TAKE 1 TABLET DAILY AS NEEDED FOR MUSCLE SPASMS 90 tablet 0   Multiple Vitamins-Minerals (HAIR/SKIN/NAILS PO) Take 1 tablet by mouth daily.     olmesartan (BENICAR) 20 MG tablet TAKE 1 TABLET DAILY 90 tablet 0   polycarbophil (FIBERCON) 625 MG tablet Take 1 tablet (625 mg total) by mouth 2 (  two) times daily. (Patient taking differently: Take 625 mg by mouth daily.) 60 tablet 0   XIIDRA 5 % SOLN Apply 1 drop to eye 2 (two) times daily. (Patient not taking: Reported on 08/24/2022)     Zinc 50 MG CAPS Take by mouth.     No current facility-administered medications for this visit.    No Known Allergies  Family History  Problem Relation Age of Onset   Hypertension Mother    Heart disease Mother        CAD   Breast cancer Mother 28   Hypertension Sister    Breast cancer Sister 73   Aneurysm Sister    Hypertension Brother    Prostate cancer Brother 49   Leukemia Maternal Uncle 58   Heart disease Maternal Grandmother    Leukemia Maternal Uncle 66   Breast cancer Cousin        dx under 39   Colon cancer Cousin         dx in her 87s   Colon cancer Cousin    Lymphoma Other 71       NHL   Colon polyps Daughter    Stroke Father    Esophageal cancer Neg Hx    Stomach cancer Neg Hx    Rectal cancer Neg Hx     Social History   Socioeconomic History   Marital status: Married    Spouse name: Homer   Number of children: 1   Years of education: 12   Highest education level: Not on file  Occupational History    Comment: retired  Tobacco Use   Smoking status: Never   Smokeless tobacco: Never  Vaping Use   Vaping Use: Never used  Substance and Sexual Activity   Alcohol use: No    Alcohol/week: 0.0 standard drinks of alcohol   Drug use: No   Sexual activity: Never  Other Topics Concern   Not on file  Social History Narrative   Consumes one glass of caffeine daily   Social Determinants of Health   Financial Resource Strain: Low Risk  (08/24/2022)   Overall Financial Resource Strain (CARDIA)    Difficulty of Paying Living Expenses: Not hard at all  Food Insecurity: No Food Insecurity (08/24/2022)   Hunger Vital Sign    Worried About Running Out of Food in the Last Year: Never true    [place] in the Last Year: Never true  Transportation Needs: No Transportation Needs (08/24/2022)   PRAPARE - Hydrologist (Medical): No    Lack of Transportation (Non-Medical): No  Physical Activity: Insufficiently Active (08/24/2022)   Exercise Vital Sign    Days of Exercise per Week: 7 days    Minutes of Exercise per Session: 10 min  Stress: No Stress Concern Present (08/24/2022)   [place]    Feeling of Stress : Not at all  Social Connections: [place] (08/24/2022)   Social Connection and Isolation Panel [NHANES]    Frequency of Communication with Friends and Family: More than three times a week    Frequency of Social Gatherings with Friends and Family: More than three times a week    Attends  Religious Services: More than 4 times per year    Active Member of [employer] or Organizations: Yes    Attends Music therapist: More than 4 times per year    Marital Status: Married  Human resources officer Violence:  Not At Risk (08/24/2022)   Humiliation, Afraid, Rape, and Kick questionnaire    Fear of Current or Ex-Partner: No    Emotionally Abused: No    Physically Abused: No    Sexually Abused: No     Constitutional: Denies fever, malaise, fatigue, headache or abrupt weight changes.  HEENT: Denies eye pain, eye redness, ear pain, ringing in the ears, wax buildup, runny nose, nasal congestion, bloody nose, or sore throat. Respiratory: Denies difficulty breathing, shortness of breath, cough or sputum production.   Cardiovascular: Denies chest pain, chest tightness, palpitations or swelling in the hands or feet.  Gastrointestinal: Denies abdominal pain, bloating, constipation, diarrhea or blood in the stool.  GU: Denies urgency, frequency, pain with urination, burning sensation, blood in urine, odor or discharge. Musculoskeletal: Patient reports chronic back pain, posterior right knee pain.  Denies decrease in range of motion, difficulty with gait, or joint swelling.  Skin: Patient reports dry skin.  Denies redness, rashes, lesions or ulcercations.  Neurological: Denies dizziness, difficulty with memory, difficulty with speech or problems with balance and coordination.  Psych: Denies anxiety, depression, SI/HI.  No other specific complaints in a complete review of systems (except as listed in HPI above).  Objective:   Physical Exam  BP 118/80 (BP Location: Left Arm, Patient Position: Sitting, Cuff Size: Large)   Pulse 85   Temp (!) 96.9 F (36.1 C) (Temporal)   Wt 247 lb (112 kg)   SpO2 97%   BMI 39.27 kg/m   Wt Readings from Last 3 Encounters:  07/08/22 244 lb (110.7 kg)  02/23/22 242 lb (109.8 kg)  01/23/22 243 lb (110.2 kg)    General: Appears her stated age, obese  in NAD. Skin: Warm, dry and intact.  HEENT: Head: normal shape and size; Eyes: sclera white, no icterus, conjunctiva pink, PERRLA and EOMs intact;  Cardiovascular: Normal rate and rhythm. S1,S2 noted.  No murmur, rubs or gallops noted. No JVD or BLE edema. No carotid bruits noted. Pulmonary/Chest: Normal effort and positive vesicular breath sounds. No respiratory distress. No wheezes, rales or ronchi noted.  Abdomen: Soft and nontender. Normal bowel sounds.  Musculoskeletal: Pain with palpation over the lumbar spine.  Joint enlargement noted of bilateral knees.  Pain with palpation over the medial joint line of the right knee.  She does have popliteal fullness of the right knee.  No difficulty with gait.  Neurological: Alert and oriented. Cranial nerves II-XII grossly intact. Coordination normal.  Psychiatric: Mood and affect normal. Behavior is normal. Judgment and thought content normal.    BMET    Component Value Date/Time   NA 135 07/08/2022 0947   NA 138 08/22/2014 1237   K 4.1 07/08/2022 0947   K 3.8 08/22/2014 1237   CL 100 07/08/2022 0947   CO2 21 07/08/2022 0947   CO2 28 08/22/2014 1237   GLUCOSE 101 (H) 07/08/2022 0947   GLUCOSE 102 08/22/2014 1237   BUN 15 07/08/2022 0947   BUN 16.0 08/22/2014 1237   CREATININE 0.92 07/08/2022 0947   CREATININE 1.1 08/22/2014 1237   CALCIUM 9.7 07/08/2022 0947   CALCIUM 9.8 08/22/2014 1237   GFRNONAA 48 (L) 01/18/2022 1620   GFRAA 72 12/01/2007 1105    Lipid Panel     Component Value Date/Time   CHOL 143 07/08/2022 0947   TRIG 129 07/08/2022 0947   HDL 49 (L) 07/08/2022 0947   CHOLHDL 2.9 07/08/2022 0947   VLDL 29.8 07/02/2020 1009   LDLCALC 73 07/08/2022 0947  CBC    Component Value Date/Time   WBC 5.7 07/08/2022 0947   RBC 4.58 07/08/2022 0947   HGB 13.6 07/08/2022 0947   HGB 13.9 08/22/2014 1237   HCT 39.5 07/08/2022 0947   HCT 42.7 08/22/2014 1237   PLT 371 07/08/2022 0947   PLT 345 08/22/2014 1237   MCV 86.2  07/08/2022 0947   MCV 88.7 08/22/2014 1237   MCH 29.7 07/08/2022 0947   MCHC 34.4 07/08/2022 0947   RDW 12.6 07/08/2022 0947   RDW 13.8 08/22/2014 1237   LYMPHSABS 2.9 08/22/2014 1237   MONOABS 0.8 08/22/2014 1237   EOSABS 0.4 08/22/2014 1237   BASOSABS 0.1 08/22/2014 1237    Hgb A1C Lab Results  Component Value Date   HGBA1C 5.5 07/08/2022           Assessment & Plan:   Right Knee Swelling:  Concern for Baker's cyst She declines ultrasound for further evaluation refer to Ortho at this time   RTC in 6 months for follow-up of chronic conditions Webb Silversmith, NP

## 2023-01-06 NOTE — Assessment & Plan Note (Signed)
Controlled on olmesartan, HCTZ and potassium Reinforced DASH diet and exercise for weight loss C-Met today

## 2023-01-07 LAB — COMPLETE METABOLIC PANEL WITH GFR
AG Ratio: 1.3 (calc) (ref 1.0–2.5)
ALT: 17 U/L (ref 6–29)
AST: 16 U/L (ref 10–35)
Albumin: 4.3 g/dL (ref 3.6–5.1)
Alkaline phosphatase (APISO): 62 U/L (ref 37–153)
BUN/Creatinine Ratio: 17 (calc) (ref 6–22)
BUN: 18 mg/dL (ref 7–25)
CO2: 28 mmol/L (ref 20–32)
Calcium: 9.9 mg/dL (ref 8.6–10.4)
Chloride: 100 mmol/L (ref 98–110)
Creat: 1.06 mg/dL — ABNORMAL HIGH (ref 0.60–0.95)
Globulin: 3.3 g/dL (calc) (ref 1.9–3.7)
Glucose, Bld: 104 mg/dL — ABNORMAL HIGH (ref 65–99)
Potassium: 4.1 mmol/L (ref 3.5–5.3)
Sodium: 138 mmol/L (ref 135–146)
Total Bilirubin: 0.8 mg/dL (ref 0.2–1.2)
Total Protein: 7.6 g/dL (ref 6.1–8.1)
eGFR: 53 mL/min/{1.73_m2} — ABNORMAL LOW (ref >=60)

## 2023-01-07 LAB — HEMOGLOBIN A1C
Hgb A1c MFr Bld: 5.8 % of total Hgb — ABNORMAL HIGH (ref <5.7)
Mean Plasma Glucose: 120 mg/dL
eAG (mmol/L): 6.6 mmol/L

## 2023-01-07 LAB — LIPID PANEL
Cholesterol: 142 mg/dL (ref <200)
HDL: 48 mg/dL — ABNORMAL LOW (ref >=50)
LDL Cholesterol (Calc): 71 mg/dL (calc)
Non-HDL Cholesterol (Calc): 94 mg/dL (calc) (ref <130)
Total CHOL/HDL Ratio: 3 (calc) (ref <5.0)
Triglycerides: 150 mg/dL — ABNORMAL HIGH (ref <150)

## 2023-01-07 LAB — CBC
HCT: 41.3 % (ref 35.0–45.0)
Hemoglobin: 13.7 g/dL (ref 11.7–15.5)
MCH: 29.3 pg (ref 27.0–33.0)
MCHC: 33.2 g/dL (ref 32.0–36.0)
MCV: 88.2 fL (ref 80.0–100.0)
MPV: 11.2 fL (ref 7.5–12.5)
Platelets: 418 10*3/uL — ABNORMAL HIGH (ref 140–400)
RBC: 4.68 10*6/uL (ref 3.80–5.10)
RDW: 12.4 % (ref 11.0–15.0)
WBC: 6.7 10*3/uL (ref 3.8–10.8)

## 2023-01-12 DIAGNOSIS — Z1231 Encounter for screening mammogram for malignant neoplasm of breast: Secondary | ICD-10-CM | POA: Diagnosis not present

## 2023-01-12 LAB — HM MAMMOGRAPHY

## 2023-01-13 ENCOUNTER — Encounter: Payer: Self-pay | Admitting: Internal Medicine

## 2023-02-10 ENCOUNTER — Ambulatory Visit (INDEPENDENT_AMBULATORY_CARE_PROVIDER_SITE_OTHER): Payer: Medicare Other | Admitting: Orthopaedic Surgery

## 2023-02-10 ENCOUNTER — Other Ambulatory Visit (INDEPENDENT_AMBULATORY_CARE_PROVIDER_SITE_OTHER): Payer: Medicare Other

## 2023-02-10 ENCOUNTER — Encounter: Payer: Self-pay | Admitting: Orthopaedic Surgery

## 2023-02-10 VITALS — Ht 66.5 in | Wt 247.0 lb

## 2023-02-10 DIAGNOSIS — M1711 Unilateral primary osteoarthritis, right knee: Secondary | ICD-10-CM | POA: Diagnosis not present

## 2023-02-10 DIAGNOSIS — G8929 Other chronic pain: Secondary | ICD-10-CM

## 2023-02-10 DIAGNOSIS — M25561 Pain in right knee: Secondary | ICD-10-CM

## 2023-02-10 MED ORDER — METHYLPREDNISOLONE ACETATE 40 MG/ML IJ SUSP
40.0000 mg | INTRAMUSCULAR | Status: AC | PRN
  Administered 2023-02-10: 40 mg via INTRA_ARTICULAR

## 2023-02-10 MED ORDER — LIDOCAINE HCL 1 % IJ SOLN
3.0000 mL | INTRAMUSCULAR | Status: AC | PRN
  Administered 2023-02-10: 3 mL

## 2023-02-10 NOTE — Progress Notes (Signed)
The patient is a very pleasant 82 year old female who comes in today for evaluation treatment of right knee and leg pain.  She says the right knee feels unsteady to her.  She says stairs are very difficult.  She has a history of a right knee arthroscopy over 15 years ago due to a meniscal tear.  She does not walk with assistive device regularly but sometimes does use a cane, but she uses it in her right hand.  On exam she does point to the medial aspect of her knee as a source of her pain.  Some of it is the medial joint line where she points to but some of it is just distal to that over the pes bursa and she does have posterior pain on that medial side of her knee as well.  Her range of motion of the knee is full but there is some patellofemoral crepitation.  There is no significant effusion today and there is varus malalignment that is correctable.  The knee feels ligamentously stable.  2 views of the right knee does show tricompartment arthritis with significant medial joint space narrowing and patellofemoral narrowing.  There is osteophytes on the compartments and no effusion.  There is varus malalignment.  We talked about conservative treatment options for now.  She will work on activity modification and transition of a cane to her left hand instead of her right hand.  I did offer her a steroid injection in her right knee and she agreed to this and tolerated it well.  She may be a candidate later for hyaluronic acid.  I would like to see her back in 4 weeks to see how she is doing overall and see what our neck step may be for her.     Procedure Note  Patient: Miranda Mueller             Date of Birth: 1941-08-13           MRN: [id number]             Visit Date: 02/10/2023  Procedures: Visit Diagnoses:  1. Chronic pain of right knee   2. Unilateral primary osteoarthritis, right knee     Large Joint Inj: R knee on 02/10/2023 1:58 PM Indications: diagnostic evaluation and pain Details: 22 G 1.5  in needle, superolateral approach  Arthrogram: No  Medications: 3 mL lidocaine 1 %; 40 mg methylPREDNISolone acetate 40 MG/ML Outcome: tolerated well, no immediate complications Procedure, treatment alternatives, risks and benefits explained, specific risks discussed. Consent was given by the patient. Immediately prior to procedure a time out was called to verify the correct patient, procedure, equipment, support staff and site/side marked as required. Patient was prepped and draped in the usual sterile fashion.

## 2023-03-10 ENCOUNTER — Other Ambulatory Visit: Payer: Self-pay | Admitting: Internal Medicine

## 2023-03-10 ENCOUNTER — Encounter: Payer: Self-pay | Admitting: Orthopaedic Surgery

## 2023-03-10 ENCOUNTER — Ambulatory Visit (INDEPENDENT_AMBULATORY_CARE_PROVIDER_SITE_OTHER): Payer: Medicare Other | Admitting: Orthopaedic Surgery

## 2023-03-10 DIAGNOSIS — M25571 Pain in right ankle and joints of right foot: Secondary | ICD-10-CM

## 2023-03-10 DIAGNOSIS — M1711 Unilateral primary osteoarthritis, right knee: Secondary | ICD-10-CM

## 2023-03-10 DIAGNOSIS — I10 Essential (primary) hypertension: Secondary | ICD-10-CM

## 2023-03-10 MED ORDER — METHYLPREDNISOLONE ACETATE 40 MG/ML IJ SUSP
20.0000 mg | INTRAMUSCULAR | Status: AC | PRN
  Administered 2023-03-10: 20 mg via INTRA_ARTICULAR

## 2023-03-10 MED ORDER — LIDOCAINE HCL 1 % IJ SOLN
0.5000 mL | INTRAMUSCULAR | Status: AC | PRN
  Administered 2023-03-10: .5 mL

## 2023-03-10 NOTE — Progress Notes (Signed)
Office Visit Note   Patient: Miranda Mueller           Date of Birth: 11-13-1940           MRN: [id number] Visit Date: 03/10/2023              Requested by: Lorre Munroe, NP [address] [place],  [place] [address] PCP: Lorre Munroe, NP   Assessment & Plan: Visit Diagnoses:  1. Sinus tarsi syndrome of right foot   2. Unilateral primary osteoarthritis, right knee     Plan: She understands to wait at least 3 months between cortisone injections in the right knee.  She will let us know if she wants to proceed with viscosupplementation in the knee.  Regards to her right sinus Tarsi impingement recommended that she change her shoes out daily and wear the insert with high foot medial arch.  She will follow-up with [place] as needed.  Questions were encouraged and answered  Follow-Up Instructions: Return if symptoms worsen or fail to improve.   Orders:  Orders Placed This Encounter  Procedures   Small Joint Inj: R subtalar   No orders of the defined types were placed in this encounter.     Procedures: Small Joint Inj: R subtalar on 03/10/2023 11:33 AM Details: 25 G needle, lateral approach  Spinal Needle: No  Medications: 0.5 mL lidocaine 1 %; 20 mg methylPREDNISolone acetate 40 MG/ML Consent was given by the patient. Immediately prior to procedure a time out was called to verify the correct patient, procedure, equipment, support staff and site/side marked as required. Patient was prepped and draped in the usual sterile fashion.       Clinical Data: No additional findings.   Subjective: Chief Complaint  Patient presents with   Right Knee - Follow-up    HPI Miranda Mueller returns today follow-up of her right knee.  She states that her knee pain is 60% better.  She states it catches it on states she has 5-6 out of 10 pain which is very short-lived.  She is taking Tylenol for pain and says it helps.  This point time she is happy with the results that she gained with the injection.   Main complaint today is right ankle pain.  She is having pain anterior lateral aspect of the right ankle no injury.  No swelling.  Review of Systems See HPI otherwise negative  Objective: Vital Signs: There were no vitals taken for this visit.  Physical Exam Constitutional:      Appearance: She is not ill-appearing or diaphoretic.  Pulmonary:     Effort: Pulmonary effort is normal.  Neurological:     Mental Status: She is alert and oriented to person, place, and time.  Psychiatric:        Mood and Affect: Mood normal.     Ortho Exam Bilateral feet pes planus.  Full dorsiflexion plantarflexion bilateral ankles without pain.  5 out of 5 strength with inversion eversion against resistance bilateral feet.  Nontender over the posterior tibial tendons bilaterally.  Slight tenderness over the right peroneal tendon just posterior to the lateral malleolus.  Maximal tenderness right foot sinus Tarsi region.  Left foot is nontender throughout.  There is no rashes skin lesions ulcerations or impending ulcers of either foot. Specialty Comments:  No specialty comments available.  Imaging: No results found.   PMFS History: Patient Active Problem List   Diagnosis Date Noted   Unilateral primary osteoarthritis, right knee 02/10/2023  Aortic atherosclerosis (HCC) 02/23/2022   CKD (chronic kidney disease) stage 3, GFR 30-59 ml/min (HCC) 07/03/2021   Class 2 obesity due to excess calories with body mass index (BMI) of 39.0 to 39.9 in adult 07/03/2021   Prediabetes 07/02/2020   Chronic midline low back pain with right-sided sciatica 07/02/2020   Eczema 07/02/2020   OSA (obstructive sleep apnea) 08/27/2015   History of breast cancer 08/20/2014   HLD (hyperlipidemia) 11/23/2007   Essential hypertension 11/23/2007   GERD 11/23/2007   Past Medical History:  Diagnosis Date   Bradycardia    Breast cancer (HCC) 08/29/14   Right Breast -High Grade Invasive Mammary   Cataract    surgery   GERD  (gastroesophageal reflux disease)    History of shingles    Hx of radiation therapy 10/01/14- 11/20/14   right breast/50.4 Gy/28 fx; right breast boost/10 Gy/5 fx   Hyperlipidemia    Hypertension    IBS (irritable bowel syndrome)    Multifocal PVCs    PVC (premature ventricular contraction)    Hx of   Sleep apnea    no cpap per pt     Family History  Problem Relation Age of Onset   Hypertension Mother    Heart disease Mother        CAD   Breast cancer Mother 70   Hypertension Sister    Breast cancer Sister 11   Aneurysm Sister    Hypertension Brother    Prostate cancer Brother 31   Leukemia Maternal Uncle 70   Heart disease Maternal Grandmother    Leukemia Maternal Uncle 12   Breast cancer Cousin        dx under 50   Colon cancer Cousin        dx in her 35s   Colon cancer Cousin    Lymphoma Other 49       NHL   Colon polyps Daughter    Stroke Father    Esophageal cancer Neg Hx    Stomach cancer Neg Hx    Rectal cancer Neg Hx     Past Surgical History:  Procedure Laterality Date   BREAST LUMPECTOMY Right 08/29/14   started radiation 10/01/14   CATARACT EXTRACTION  2009   bilateral   CHOLECYSTECTOMY N/A 11/28/2021   Procedure: LAPAROSCOPIC CHOLECYSTECTOMY WITH INTRAOPERATIVE CHOLANGIOGRAM, LIVER BIOPSY;  Surgeon: Karie Soda, MD;  Location: [facility];  Service: General;  Laterality: N/A;   COLONOSCOPY  2004-last 2016   normal   KNEE ARTHROSCOPY     right   NEEDLE CORE BIOPSY  RIGHT BREAST Right 08/14/14   OTHER SURGICAL HISTORY Left 1997   breast,cyst   PYLOROPLASTY  2016   RADIOACTIVE SEED GUIDED PARTIAL MASTECTOMY WITH AXILLARY SENTINEL LYMPH NODE BIOPSY Right 08/29/2014   Procedure: SEED LOCALIZED RIGHT BREAST LUMPECTOMY AND RIGHT AXILLARY SENTTINEL LYMPH NODE BIOPSY;  Surgeon: Glenna Fellows, MD;  Location: [facility];  Service: General;  Laterality: Right;   WISDOM TOOTH EXTRACTION     Social History   Occupational History    Comment:  retired  Tobacco Use   Smoking status: Never   Smokeless tobacco: Never  Vaping Use   Vaping Use: Never used  Substance and Sexual Activity   Alcohol use: No    Alcohol/week: 0.0 standard drinks of alcohol   Drug use: No   Sexual activity: Never

## 2023-03-10 NOTE — Telephone Encounter (Signed)
Requested Prescriptions  Pending Prescriptions Disp Refills   hydrochlorothiazide (HYDRODIURIL) 25 MG tablet [Pharmacy Med Name: HYDROCHLOROTHIAZIDE TABS 25MG ] 90 tablet 0    Sig: TAKE 1 TABLET DAILY     Cardiovascular: Diuretics - Thiazide Failed - 03/10/2023 12:32 AM      Failed - Cr in normal range and within 180 days    Creatinine  Date Value Ref Range Status  08/22/2014 1.1 0.6 - 1.1 mg/dL Final   Creat  Date Value Ref Range Status  01/06/2023 1.06 (H) 0.60 - 0.95 mg/dL Final         Passed - K in normal range and within 180 days    Potassium  Date Value Ref Range Status  01/06/2023 4.1 3.5 - 5.3 mmol/L Final  08/22/2014 3.8 3.5 - 5.1 mEq/L Final         Passed - Na in normal range and within 180 days    Sodium  Date Value Ref Range Status  01/06/2023 138 135 - 146 mmol/L Final  08/22/2014 138 136 - 145 mEq/L Final         Passed - Last BP in normal range    BP Readings from Last 1 Encounters:  01/06/23 118/80         Passed - Valid encounter within last 6 months    Recent Outpatient Visits           2 months ago Mixed hyperlipidemia   [place] [facility] [place], Salvadore Oxford, NP   8 months ago Aortic atherosclerosis [facility])   [place] [facility] Lorre Munroe, NP   1 year ago Aortic atherosclerosis [facility])   [place] [facility] [place], Salvadore Oxford, NP   1 year ago Hypokalemia   [place] [facility] [place], [place] W, NP   1 year ago Acute biliary pancreatitis without infection or necrosis   [facility] [place], Salvadore Oxford, [place]

## 2023-04-05 ENCOUNTER — Other Ambulatory Visit: Payer: Self-pay | Admitting: Internal Medicine

## 2023-04-05 DIAGNOSIS — I10 Essential (primary) hypertension: Secondary | ICD-10-CM

## 2023-04-06 NOTE — Telephone Encounter (Signed)
Requested Prescriptions  Pending Prescriptions Disp Refills   olmesartan (BENICAR) 20 MG tablet [Pharmacy Med Name: OLMESARTAN MEDOXOMIL TABS 20MG ] 90 tablet 0    Sig: TAKE 1 TABLET DAILY     Cardiovascular:  Angiotensin Receptor Blockers Failed - 04/05/2023  1:06 AM      Failed - Cr in normal range and within 180 days    Creatinine  Date Value Ref Range Status  08/22/2014 1.1 0.6 - 1.1 mg/dL Final   Creat  Date Value Ref Range Status  01/06/2023 1.06 (H) 0.60 - 0.95 mg/dL Final         Passed - K in normal range and within 180 days    Potassium  Date Value Ref Range Status  01/06/2023 4.1 3.5 - 5.3 mmol/L Final  08/22/2014 3.8 3.5 - 5.1 mEq/L Final         Passed - Patient is not pregnant      Passed - Last BP in normal range    BP Readings from Last 1 Encounters:  01/06/23 118/80         Passed - Valid encounter within last 6 months    Recent Outpatient Visits           3 months ago Mixed hyperlipidemia   [place] [facility] [place], Salvadore Oxford, NP   9 months ago Aortic atherosclerosis [facility])   [place] [facility] [place], Salvadore Oxford, NP   1 year ago Aortic atherosclerosis [facility])   [facility] [place], Salvadore Oxford, NP   1 year ago Hypokalemia    [facility] [place], [place] W, NP   1 year ago Acute biliary pancreatitis without infection or necrosis   [facility] [place], Salvadore Oxford, [place]

## 2023-04-20 ENCOUNTER — Encounter: Payer: Self-pay | Admitting: *Deleted

## 2023-04-20 ENCOUNTER — Telehealth: Payer: Self-pay | Admitting: *Deleted

## 2023-04-20 NOTE — Patient Outreach (Signed)
  Care Coordination   Initial Visit Note   04/20/2023 Name: Miranda Mueller MRN: [id number] DOB: 08-Sep-1941  Miranda Mueller is a 82 y.o. year old female who sees Baity, Salvadore Oxford, NP for primary care. I spoke with  Fritz Pickerel by phone today.  What matters to the patients health and wellness today?  Remain independent.  Does not feel follow up is necessary at this time but will call with questions/concerns.   Goals Addressed             This Visit's Progress    COMPLETED: Care Coordination Activities - No follow up needed       Interventions Today    Flowsheet Row Most Recent Value  Chronic Disease   Chronic disease during today's visit Hypertension (HTN), Other  [HLD]  General Interventions   General Interventions Discussed/Reviewed Labs, General Interventions Reviewed, Doctor Visits, Durable Medical Equipment (DME)  Labs --  [lipid panel]  Doctor Visits Discussed/Reviewed Doctor Visits Reviewed, Annual Wellness Visits, PCP  [State she will have AWV and next PCP visit together on 11/22]  Durable Medical Equipment (DME) BP Cuff  PCP/Specialist Visits Compliance with follow-up visit  [last PCP visit in March, need 6 month follow up]  Education Interventions   Education Provided Provided Education  Provided Verbal Education On Nutrition, Medication, When to see the doctor, Other  [Encouraged to monitor blood pressure a couple times a week.]  Nutrition Interventions   Nutrition Discussed/Reviewed Nutrition Reviewed, Adding fruits and vegetables, Decreasing fats  [Discussed low salt/low cholesterol diet.  STate she eat fresh fruits/veggies, has her own garden]  Pharmacy Interventions   Pharmacy Dicussed/Reviewed Medications and their functions, Affording Medications, Pharmacy Topics Reviewed              SDOH assessments and interventions completed:  Yes  SDOH Interventions Today    Flowsheet Row Most Recent Value  SDOH Interventions   Food Insecurity Interventions  Intervention Not Indicated  Housing Interventions Intervention Not Indicated  Transportation Interventions Intervention Not Indicated        Care Coordination Interventions:  Yes, provided   Follow up plan: No further intervention required.   Encounter Outcome:  Pt. Visit Completed   Kemper Durie, RN, MSN, [facility], [facility] Care Management Care Management Coordinator [phone]

## 2023-04-22 DIAGNOSIS — L821 Other seborrheic keratosis: Secondary | ICD-10-CM | POA: Diagnosis not present

## 2023-04-23 ENCOUNTER — Ambulatory Visit
Admission: EM | Admit: 2023-04-23 | Discharge: 2023-04-23 | Disposition: A | Discharge status: Home / Self Care | Payer: Medicare Other | Attending: Emergency Medicine | Admitting: Emergency Medicine

## 2023-04-23 DIAGNOSIS — B349 Viral infection, unspecified: Secondary | ICD-10-CM

## 2023-04-23 DIAGNOSIS — U071 COVID-19: Secondary | ICD-10-CM | POA: Diagnosis not present

## 2023-04-23 DIAGNOSIS — X58XXXA Exposure to other specified factors, initial encounter: Secondary | ICD-10-CM | POA: Insufficient documentation

## 2023-04-23 DIAGNOSIS — T7840XA Allergy, unspecified, initial encounter: Secondary | ICD-10-CM

## 2023-04-23 DIAGNOSIS — R051 Acute cough: Secondary | ICD-10-CM | POA: Diagnosis not present

## 2023-04-23 MED ORDER — BENZONATATE 100 MG PO CAPS: 100.0000 mg | ORAL_CAPSULE | Freq: Three times a day (TID) | ORAL | 0 refills | Status: AC | PRN

## 2023-04-23 NOTE — ED Triage Notes (Signed)
Patient to Urgent Care with complaints of cough/ nasal congestion/ chills. Symptoms started last night. Reports concerns about a sinus infection. Denies any known fevers.   Reports using an expired covid test at home that showed positive.

## 2023-04-23 NOTE — Discharge Instructions (Addendum)
Wear your mask, check MyChart for your results.  May use over-the-counter Coricidin HBP for symptom management, Tylenol as label directed.  Take Tessalon as needed.  If you develop shortness of breath or chest pain go immediately to the emergency room for further evaluation

## 2023-04-25 LAB — SARS CORONAVIRUS 2 (TAT 6-24 HRS): SARS Coronavirus 2: POSITIVE — AB

## 2023-06-30 ENCOUNTER — Ambulatory Visit (INDEPENDENT_AMBULATORY_CARE_PROVIDER_SITE_OTHER): Payer: Medicare Other | Admitting: Orthopaedic Surgery

## 2023-06-30 DIAGNOSIS — M1711 Unilateral primary osteoarthritis, right knee: Secondary | ICD-10-CM | POA: Diagnosis not present

## 2023-06-30 DIAGNOSIS — M25561 Pain in right knee: Secondary | ICD-10-CM

## 2023-06-30 DIAGNOSIS — G8929 Other chronic pain: Secondary | ICD-10-CM

## 2023-06-30 MED ORDER — HYALURONAN 88 MG/4ML IX SOSY
88.0000 mg | PREFILLED_SYRINGE | INTRA_ARTICULAR | Status: AC | PRN
Start: 2023-06-30 — End: 2023-06-30
  Administered 2023-06-30: 88 mg via INTRA_ARTICULAR

## 2023-06-30 NOTE — Progress Notes (Signed)
The patient is an 82 year old who comes in today for hyaluronic acid injection in her right knee to treat the pain from osteoarthritis.  She is had steroid injections in the knee before and this only lasted about 3 to 4 weeks.  A lot of her pain is with walking up and down stairs and with standing after sitting for a while.  Examination of her right knee shows slight varus malalignment.  There is no effusion today but global tenderness about the arc of motion of her knee.  We did place Monovisc in her right knee today without difficulty.  She knows to wait at least 3 months before another steroid injection and at least 6 months before these types of injections again.  Other options would include knee replacement surgery.  All questions and concerns were answered and addressed.  Lot #[id number]    Procedure Note  Patient: Miranda Mueller             Date of Birth: 03-16-1941           MRN: [id number]             Visit Date: 06/30/2023  Procedures: Visit Diagnoses:  1. Chronic pain of right knee   2. Unilateral primary osteoarthritis, right knee     Large Joint Inj: R knee on 06/30/2023 3:32 PM Indications: diagnostic evaluation and pain Details: 22 G 1.5 in needle, superolateral approach  Arthrogram: No  Medications: 88 mg Hyaluronan 88 MG/4ML Outcome: tolerated well, no immediate complications Procedure, treatment alternatives, risks and benefits explained, specific risks discussed. Consent was given by the patient. Immediately prior to procedure a time out was called to verify the correct patient, procedure, equipment, support staff and site/side marked as required. Patient was prepped and draped in the usual sterile fashion.

## 2023-07-05 ENCOUNTER — Other Ambulatory Visit: Payer: Self-pay | Admitting: Internal Medicine

## 2023-07-05 DIAGNOSIS — I10 Essential (primary) hypertension: Secondary | ICD-10-CM

## 2023-07-06 NOTE — Telephone Encounter (Signed)
Requested Prescriptions  Pending Prescriptions Disp Refills   olmesartan (BENICAR) 20 MG tablet [Pharmacy Med Name: OLMESARTAN MEDOXOMIL TABS 20MG ] 90 tablet 0    Sig: TAKE 1 TABLET DAILY     Cardiovascular:  Angiotensin Receptor Blockers Failed - 07/05/2023  1:24 AM      Failed - Cr in normal range and within 180 days    Creatinine  Date Value Ref Range Status  08/22/2014 1.1 0.6 - 1.1 mg/dL Final   Creat  Date Value Ref Range Status  01/06/2023 1.06 (H) 0.60 - 0.95 mg/dL Final         Passed - K in normal range and within 180 days    Potassium  Date Value Ref Range Status  01/06/2023 4.1 3.5 - 5.3 mmol/L Final  08/22/2014 3.8 3.5 - 5.1 mEq/L Final         Passed - Patient is not pregnant      Passed - Last BP in normal range    BP Readings from Last 1 Encounters:  04/23/23 136/82         Passed - Valid encounter within last 6 months    Recent Outpatient Visits           6 months ago Mixed hyperlipidemia   [place] [facility] [place], Salvadore Oxford, NP   12 months ago Aortic atherosclerosis [facility])   [place] [facility] [place], Salvadore Oxford, NP   1 year ago Aortic atherosclerosis [facility])   [place] [facility] [place], Salvadore Oxford, NP   1 year ago Hypokalemia   [place] [facility] [place], [place], NP   1 year ago Acute biliary pancreatitis without infection or necrosis   [facility] [place], Salvadore Oxford, [place]

## 2023-07-12 ENCOUNTER — Ambulatory Visit: Payer: Medicare Other | Admitting: Internal Medicine

## 2023-09-03 ENCOUNTER — Ambulatory Visit (INDEPENDENT_AMBULATORY_CARE_PROVIDER_SITE_OTHER): Payer: Medicare Other

## 2023-09-03 VITALS — BP 124/80 | Ht 66.5 in | Wt 257.4 lb

## 2023-09-03 DIAGNOSIS — Z0000 Encounter for general adult medical examination without abnormal findings: Secondary | ICD-10-CM

## 2023-09-03 NOTE — Patient Instructions (Signed)
Miranda Mueller , Thank you for taking time to come for your Medicare Wellness Visit. I appreciate your ongoing commitment to your health goals. Please review the following plan we discussed and let me know if I can assist you in the future.   Referrals/Orders/Follow-Ups/Clinician Recommendations: none  This is a list of the screening recommended for you and due dates:  Health Maintenance  Topic Date Due   DTaP/Tdap/Td vaccine (3 - Tdap) 04/19/2022   COVID-19 Vaccine (5 - 2023-24 season) 06/13/2023   Mammogram  01/12/2024   Medicare Annual Wellness Visit  09/02/2024   Pneumonia Vaccine  Completed   Flu Shot  Completed   DEXA scan (bone density measurement)  Completed   Zoster (Shingles) Vaccine  Completed   HPV Vaccine  Aged Out   Hepatitis C Screening  Discontinued    Advanced directives: (Copy Requested) Please bring a copy of your health care power of attorney and living will to the office to be added to your chart at your convenience.  Next Medicare Annual Wellness Visit scheduled for next year: Yes   09/15/24 @ 8:10 am by phone

## 2023-09-03 NOTE — Progress Notes (Signed)
Subjective:   Miranda Mueller is a 82 y.o. female who presents for Medicare Annual (Subsequent) preventive examination.  Visit Complete: In person  Cardiac Risk Factors include: advanced age (>59men, >58 women);dyslipidemia;hypertension;sedentary lifestyle;obesity (BMI >30kg/m2)     Objective:    Today's Vitals   09/03/23 1455  BP: 124/80  Weight: 257 lb 6.4 oz (116.8 kg)  Height: 5' 6.5" (1.689 m)   Body mass index is 40.92 kg/m.     09/03/2023    3:09 PM 08/24/2022    1:50 PM 01/18/2022    4:01 PM 11/25/2021    9:52 PM 02/01/2017    8:28 AM 01/03/2016    2:02 PM 07/22/2015    7:35 AM  Advanced Directives  Does Patient Have a Medical Advance Directive? No Yes Yes Yes No Yes Yes  Type of Special educational needs teacher of [place];Living will  Healthcare Power of [place];Out of facility DNR (pink MOST or yellow form)  Living will;Healthcare Power of Attorney Living will  Does patient want to make changes to medical advance directive?  No - Patient declined  No - Patient declined     Copy of Healthcare Power of Attorney in Chart?  No - copy requested  No - copy requested  No - copy requested   Would patient like information on creating a medical advance directive? No - Patient declined   No - Patient declined       Current Medications (verified) Outpatient Encounter Medications as of 09/03/2023  Medication Sig   acetaminophen (TYLENOL) 500 MG tablet Take 500 mg by mouth every 6 (six) hours as needed.   aspirin 81 MG tablet Take 81 mg by mouth daily.   atorvastatin (LIPITOR) 10 MG tablet Take 1 tablet (10 mg total) by mouth daily.   betamethasone dipropionate 0.05 % cream APPLY TOPICALLY AS NEEDED   cetirizine (ZYRTEC) 5 MG chewable tablet Chew 5 mg by mouth daily.   cycloSPORINE (RESTASIS) 0.05 % ophthalmic emulsion Place 1 drop into both eyes 2 (two) times daily.   esomeprazole (NEXIUM) 40 MG capsule Take 1 capsule (40 mg total) by mouth daily.   fluticasone (FLONASE)  50 MCG/ACT nasal spray TAKE 2 SPRAYS INTO BOTH NOSTRILS DAILY (Patient taking differently: 2 sprays as needed for allergies. TAKE 2 SPRAYS INTO BOTH NOSTRILS DAILY)   hydrochlorothiazide (HYDRODIURIL) 25 MG tablet TAKE 1 TABLET DAILY   methocarbamol (ROBAXIN) 500 MG tablet TAKE 1 TABLET DAILY AS NEEDED FOR MUSCLE SPASMS   Multiple Vitamins-Minerals (HAIR/SKIN/NAILS PO) Take 1 tablet by mouth daily.   olmesartan (BENICAR) 20 MG tablet TAKE 1 TABLET DAILY   Zinc 50 MG CAPS Take by mouth.   fexofenadine (ALLEGRA) 180 MG tablet Take 180 mg by mouth daily. (Patient not taking: Reported on 09/03/2023)   polycarbophil (FIBERCON) 625 MG tablet Take 1 tablet (625 mg total) by mouth 2 (two) times daily. (Patient not taking: Reported on 09/03/2023)   No facility-administered encounter medications on file as of 09/03/2023.    Allergies (verified) Patient has no known allergies.   History: Past Medical History:  Diagnosis Date   Bradycardia    Breast cancer (HCC) 08/29/14   Right Breast -High Grade Invasive Mammary   Cataract    surgery   GERD (gastroesophageal reflux disease)    History of shingles    Hx of radiation therapy 10/01/14- 11/20/14   right breast/50.4 Gy/28 fx; right breast boost/10 Gy/5 fx   Hyperlipidemia    Hypertension    IBS (irritable  bowel syndrome)    Multifocal PVCs    PVC (premature ventricular contraction)    Hx of   Sleep apnea    no cpap per pt    Past Surgical History:  Procedure Laterality Date   BREAST LUMPECTOMY Right 08/29/14   started radiation 10/01/14   CATARACT EXTRACTION  2009   bilateral   CHOLECYSTECTOMY N/A 11/28/2021   Procedure: LAPAROSCOPIC CHOLECYSTECTOMY WITH INTRAOPERATIVE CHOLANGIOGRAM, LIVER BIOPSY;  Surgeon: Karie Soda, MD;  Location: [facility];  Service: General;  Laterality: N/A;   COLONOSCOPY  2004-last 2016   normal   KNEE ARTHROSCOPY     right   NEEDLE CORE BIOPSY  RIGHT BREAST Right 08/14/14   OTHER SURGICAL HISTORY Left 1997    breast,cyst   PYLOROPLASTY  2016   RADIOACTIVE SEED GUIDED PARTIAL MASTECTOMY WITH AXILLARY SENTINEL LYMPH NODE BIOPSY Right 08/29/2014   Procedure: SEED LOCALIZED RIGHT BREAST LUMPECTOMY AND RIGHT AXILLARY SENTTINEL LYMPH NODE BIOPSY;  Surgeon: Glenna Fellows, MD;  Location: [facility];  Service: General;  Laterality: Right;   WISDOM TOOTH EXTRACTION     Family History  Problem Relation Age of Onset   Hypertension Mother    Heart disease Mother        CAD   Breast cancer Mother 32   Hypertension Sister    Breast cancer Sister 29   Aneurysm Sister    Hypertension Brother    Prostate cancer Brother 8   Leukemia Maternal Uncle 70   Heart disease Maternal Grandmother    Leukemia Maternal Uncle 26   Breast cancer Cousin        dx under 50   Colon cancer Cousin        dx in her 78s   Colon cancer Cousin    Lymphoma Other 49       NHL   Colon polyps Daughter    Stroke Father    Esophageal cancer Neg Hx    Stomach cancer Neg Hx    Rectal cancer Neg Hx    Social History   Socioeconomic History   Marital status: Married    Spouse name: Homer   Number of children: 1   Years of education: 12   Highest education level: Not on file  Occupational History    Comment: retired  Tobacco Use   Smoking status: Never   Smokeless tobacco: Never  Vaping Use   Vaping status: Never Used  Substance and Sexual Activity   Alcohol use: No    Alcohol/week: 0.0 standard drinks of alcohol   Drug use: No   Sexual activity: Never  Other Topics Concern   Not on file  Social History Narrative   Consumes one glass of caffeine daily   Social Determinants of Health   Financial Resource Strain: Low Risk  (09/03/2023)   Overall Financial Resource Strain (CARDIA)    Difficulty of Paying Living Expenses: Not hard at all  Food Insecurity: No Food Insecurity (09/03/2023)   Hunger Vital Sign    Worried About Running Out of Food in the Last Year: Never true    Ran Out of Food in  the Last Year: Never true  Transportation Needs: No Transportation Needs (09/03/2023)   PRAPARE - Administrator, Civil Service (Medical): No    Lack of Transportation (Non-Medical): No  Physical Activity: Insufficiently Active (09/03/2023)   Exercise Vital Sign    Days of Exercise per Week: 7 days    Minutes of Exercise per Session:  10 min  Stress: No Stress Concern Present (09/03/2023)   [employer] of Occupational Health - Occupational Stress Questionnaire    Feeling of Stress : Not at all  Social Connections: Moderately Integrated (09/03/2023)   Social Connection and Isolation Panel [NHANES]    Frequency of Communication with Friends and Family: More than three times a week    Frequency of Social Gatherings with Friends and Family: Twice a week    Attends Religious Services: More than 4 times per year    Active Member of [employer] or Organizations: No    Attends Engineer, structural: Never    Marital Status: Married    Tobacco Counseling Counseling given: Not Answered   Clinical Intake:  Pre-visit preparation completed: Yes  Pain : No/denies pain     BMI - recorded: 40.92 Nutritional Status: BMI > 30  Obese Nutritional Risks: None Diabetes: No  How often do you need to have someone help you when you read instructions, pamphlets, or other written materials from your doctor or pharmacy?: 1 - Never  Interpreter Needed?: No  Information entered by :: Kennedy Bucker, LPN   Activities of Daily Living    09/03/2023    3:10 PM 01/06/2023    8:47 AM  In your present state of health, do you have any difficulty performing the following activities:  Hearing? 0 0  Vision? 0 0  Difficulty concentrating or making decisions? 0 0  Walking or climbing stairs? 1 1  Comment knee pain   Dressing or bathing? 0 0  Doing errands, shopping? 0 0  Preparing Food and eating ? N   Using the Toilet? N   In the past six months, have you accidently leaked urine? N    Do you have problems with loss of bowel control? N   Managing your Medications? N   Managing your Finances? N   Housekeeping or managing your Housekeeping? N     Patient Care Team: Lorre Munroe, NP as PCP - General (Internal Medicine) Serena Croissant, MD as Consulting Physician (Hematology and Oncology) Glenna Fellows, MD (Inactive) as Consulting Physician (General Surgery) Lonie Peak, MD as Attending Physician (Radiation Oncology) Hubbard Hartshorn, NP (Inactive) as Nurse Practitioner (Nurse Practitioner) Rodney Langton, RN as [facility]  Indicate any recent Medical Services you may have received from other than [facility] providers in the past year (date may be approximate).     Assessment:   This is a routine wellness examination for Miranda Mueller.  Hearing/Vision screen Hearing Screening - Comments:: No aids Vision Screening - Comments:: Had cataract sgy, readers- Dr.Groat   Goals Addressed             This Visit's Progress    DIET - EAT MORE FRUITS AND VEGETABLES         Depression Screen    09/03/2023    3:07 PM 01/06/2023    8:46 AM 08/24/2022    1:23 PM 02/23/2022    9:49 AM 01/23/2022    2:20 PM 12/05/2021    2:58 PM 07/03/2021   10:09 AM  PHQ 2/9 Scores  PHQ - 2 Score 0 0 0 0 0 0 0  PHQ- 9 Score 0   0 3 2 0    Fall Risk    09/03/2023    3:10 PM 01/06/2023    8:46 AM 08/24/2022    1:23 PM 02/23/2022    9:49 AM 01/23/2022    2:19 PM  Fall Risk   Falls in the past year? 0 0 0 0 0  Number falls in past yr: 0  0 0 0  Injury with Fall? 0 0 0 0 0  Risk for fall due to : No Fall Risks No Fall Risks No Fall Risks No Fall Risks No Fall Risks  Follow up Falls prevention discussed;Falls evaluation completed  Falls evaluation completed Falls evaluation completed Falls evaluation completed    MEDICARE RISK AT HOME: Medicare Risk at Home Any stairs in or around the home?: No If so, are there any  without handrails?: No Home free of loose throw rugs in walkways, pet beds, electrical cords, etc?: Yes Adequate lighting in your home to reduce risk of falls?: Yes Life alert?: No Use of a cane, walker or w/c?: Yes (cane most of the time) Grab bars in the bathroom?: No Shower chair or bench in shower?: No Elevated toilet seat or a handicapped toilet?: Yes  TIMED UP AND GO:  Was the test performed?  Yes  Length of time to ambulate 10 feet: 5 sec Gait slow and steady with assistive device    Cognitive Function:        09/03/2023    3:12 PM 08/24/2022    1:26 PM  6CIT Screen  What Year? 0 points 0 points  What month? 0 points 0 points  What time? 0 points 0 points  Count back from 20 0 points 0 points  Months in reverse 0 points 0 points  Repeat phrase 0 points 0 points  Total Score 0 points 0 points    Immunizations Immunization History  Administered Date(s) Administered   Fluad Quad(high Dose 65+) 07/17/2019, 07/02/2020, 07/03/2021, 07/08/2022   Influenza Split 07/13/2011, 07/25/2012   Influenza Whole 08/16/2002, 07/13/2007   Influenza, High Dose Seasonal PF 09/19/2018   Influenza,inj,Quad PF,6+ Mos 09/01/2013, 08/27/2015, 08/04/2016, 08/02/2017   Influenza-Unspecified 07/29/2023   Moderna SARS-COV2 Booster Vaccination 11/29/2020, 09/12/2021   Moderna Sars-Covid-2 Vaccination 12/09/2019, 01/06/2020   Pneumococcal Conjugate-13 08/27/2015   Pneumococcal Polysaccharide-23 02/17/2010   Td 08/16/2002, 04/19/2012   Zoster Recombinant(Shingrix) 03/04/2018, 05/23/2018   Zoster, Live 12/01/2007    TDAP status: Due, Education has been provided regarding the importance of this vaccine. Advised may receive this vaccine at local pharmacy or Health Dept. Aware to provide a copy of the vaccination record if obtained from local pharmacy or Health Dept. Verbalized acceptance and understanding.  Flu Vaccine status: Up to date  Pneumococcal vaccine status: Declined,  Education has  been provided regarding the importance of this vaccine but patient still declined. Advised may receive this vaccine at local pharmacy or Health Dept. Aware to provide a copy of the vaccination record if obtained from local pharmacy or Health Dept. Verbalized acceptance and understanding.   Covid-19 vaccine status: Completed vaccines  Qualifies for Shingles Vaccine? Yes   Zostavax completed Yes   Shingrix Completed?: Yes  Screening Tests Health Maintenance  Topic Date Due   DTaP/Tdap/Td (3 - Tdap) 04/19/2022   COVID-19 Vaccine (5 - 2023-24 season) 06/13/2023   MAMMOGRAM  01/12/2024   Medicare Annual Wellness (AWV)  09/02/2024   Pneumonia Vaccine 60+ Years old  Completed   INFLUENZA VACCINE  Completed   DEXA SCAN  Completed   Zoster Vaccines- Shingrix  Completed   HPV VACCINES  Aged Out   Hepatitis C Screening  Discontinued  RSV 08/20/23  Health Maintenance  Health Maintenance Due  Topic Date Due   DTaP/Tdap/Td (3 - Tdap) 04/19/2022  COVID-19 Vaccine (5 - 2023-24 season) 06/13/2023    Colorectal cancer screening: No longer required.   Mammogram status: Completed 01/12/23. Repeat every year  Bone Density status: Completed 03/17/21. Results reflect: Bone density results: NORMAL. Repeat every 5 years.  Lung Cancer Screening: (Low Dose CT Chest recommended if Age 63-80 years, 20 pack-year currently smoking OR have quit w/in 15years.) does not qualify.    Additional Screening:  Hepatitis C Screening: does not qualify; Completed 07/03/21  Vision Screening: Recommended annual ophthalmology exams for early detection of glaucoma and other disorders of the eye. Is the patient up to date with their annual eye exam?  Yes  Who is the provider or what is the name of the office in which the patient attends annual eye exams? Dr.Groat If pt is not established with a provider, would they like to be referred to a provider to establish care? No .   Dental Screening: Recommended annual dental  exams for proper oral hygiene   Community Resource Referral / Chronic Care Management: CRR required this visit?  No   CCM required this visit?  No     Plan:     I have personally reviewed and noted the following in the patient's chart:   Medical and social history Use of alcohol, tobacco or illicit drugs  Current medications and supplements including opioid prescriptions. Patient is not currently taking opioid prescriptions. Functional ability and status Nutritional status Physical activity Advanced directives List of other physicians Hospitalizations, surgeries, and ER visits in previous 12 months Vitals Screenings to include cognitive, depression, and falls Referrals and appointments  In addition, I have reviewed and discussed with patient certain preventive protocols, quality metrics, and best practice recommendations. A written personalized care plan for preventive services as well as general preventive health recommendations were provided to patient.     Hal Hope, LPN   02/[id number]/[id number]   After Visit Summary: (In Person-Declined) Patient declined AVS at this time.- on mychart  Nurse Notes: none

## 2023-09-06 ENCOUNTER — Other Ambulatory Visit: Payer: Self-pay | Admitting: Internal Medicine

## 2023-09-06 DIAGNOSIS — I10 Essential (primary) hypertension: Secondary | ICD-10-CM

## 2023-09-07 NOTE — Telephone Encounter (Signed)
Requested medication (s) are due for refill today:   Yes  Requested medication (s) are on the active medication list:   Yes  Future visit scheduled:   Yes 12/9   Last ordered: 03/10/2023 #90, 1 refill  Unable to refill because labs are due per protocol.      Requested Prescriptions  Pending Prescriptions Disp Refills   hydrochlorothiazide (HYDRODIURIL) 25 MG tablet [Pharmacy Med Name: HYDROCHLOROTHIAZIDE TABS 25MG ] 90 tablet 3    Sig: TAKE 1 TABLET DAILY     Cardiovascular: Diuretics - Thiazide Failed - 09/06/2023  1:05 AM      Failed - Cr in normal range and within 180 days    Creatinine  Date Value Ref Range Status  08/22/2014 1.1 0.6 - 1.1 mg/dL Final   Creat  Date Value Ref Range Status  01/06/2023 1.06 (H) 0.60 - 0.95 mg/dL Final         Failed - K in normal range and within 180 days    Potassium  Date Value Ref Range Status  01/06/2023 4.1 3.5 - 5.3 mmol/L Final  08/22/2014 3.8 3.5 - 5.1 mEq/L Final         Failed - Na in normal range and within 180 days    Sodium  Date Value Ref Range Status  01/06/2023 138 135 - 146 mmol/L Final  08/22/2014 138 136 - 145 mEq/L Final         Passed - Last BP in normal range    BP Readings from Last 1 Encounters:  09/03/23 124/80         Passed - Valid encounter within last 6 months    Recent Outpatient Visits           8 months ago Mixed hyperlipidemia   [place] [facility] [place], Salvadore Oxford, NP   1 year ago Aortic atherosclerosis [facility])   [place] Lorre Munroe, NP   1 year ago Aortic atherosclerosis [facility])   [place] [facility] [place], Salvadore Oxford, NP   1 year ago Hypokalemia   [place] [facility] [place], [place] W, NP   1 year ago Acute biliary pancreatitis without infection or necrosis   [place] [facility] [place], Salvadore Oxford, NP       Future Appointments             In 1 week [place], Salvadore Oxford, NP   [facility]

## 2023-09-13 ENCOUNTER — Other Ambulatory Visit: Payer: Self-pay | Admitting: Internal Medicine

## 2023-09-15 NOTE — Telephone Encounter (Signed)
Requested by interface surescripts. Future visit in 5 days.  Requested Prescriptions  Pending Prescriptions Disp Refills   atorvastatin (LIPITOR) 10 MG tablet [Pharmacy Med Name: ATORVASTATIN TABS 10MG ] 90 tablet 0    Sig: TAKE 1 TABLET DAILY     Cardiovascular:  Antilipid - Statins Failed - 09/13/2023  1:01 AM      Failed - Lipid Panel in normal range within the last 12 months    Cholesterol  Date Value Ref Range Status  01/06/2023 142 <200 mg/dL Final   LDL Cholesterol (Calc)  Date Value Ref Range Status  01/06/2023 71 mg/dL (calc) Final    Comment:    Reference range: <100 . Desirable range <100 mg/dL for primary prevention;   <70 mg/dL for patients with CHD or diabetic patients  with > or = 2 CHD risk factors. Marland Kitchen LDL-C is now calculated using the Martin-Hopkins  calculation, which is a validated novel method providing  better accuracy than the Friedewald equation in the  estimation of LDL-C.  Horald Pollen et al. Lenox Ahr. 6578;469(62): 2061-2068  (http://education.[facility].com/faq/FAQ164)    Direct LDL  Date Value Ref Range Status  07/18/2012 153.2 mg/dL Final    Comment:    Optimal:  <100 mg/dLNear or Above Optimal:  100-129 mg/dLBorderline High:  130-159 mg/dLHigh:  160-189 mg/dLVery High:  >190 mg/dL   HDL  Date Value Ref Range Status  01/06/2023 48 (L) > OR = 50 mg/dL Final   Triglycerides  Date Value Ref Range Status  01/06/2023 150 (H) <150 mg/dL Final         Passed - Patient is not pregnant      Passed - Valid encounter within last 12 months    Recent Outpatient Visits           8 months ago Mixed hyperlipidemia   [place] [facility] [place], Salvadore Oxford, NP   1 year ago Aortic atherosclerosis [facility])   [place] [facility] [place], Salvadore Oxford, NP   1 year ago Aortic atherosclerosis [facility])   [place] [facility] [place], Salvadore Oxford, NP   1 year ago Hypokalemia   [place] [facility]  [place], [place] W, NP   1 year ago Acute biliary pancreatitis without infection or necrosis   [place] [facility] [place], Salvadore Oxford, NP       Future Appointments             In 5 days [place], Salvadore Oxford, NP [facility]             esomeprazole (NEXIUM) 40 MG capsule [Pharmacy Med Name: ESOMEPRAZOLE MAGNESIUM DR CAPS 40MG ] 90 capsule 0    Sig: TAKE 1 CAPSULE DAILY     Gastroenterology: Proton Pump Inhibitors 2 Passed - 09/13/2023  1:01 AM      Passed - ALT in normal range and within 360 days    ALT  Date Value Ref Range Status  01/06/2023 17 6 - 29 U/L Final  08/22/2014 22 0 - 55 U/L Final         Passed - AST in normal range and within 360 days    AST  Date Value Ref Range Status  01/06/2023 16 10 - 35 U/L Final  08/22/2014 17 5 - 34 U/L Final         Passed - Valid encounter within last 12 months    Recent Outpatient Visits  8 months ago Mixed hyperlipidemia   [place] [facility] [place], Salvadore Oxford, NP   1 year ago Aortic atherosclerosis [facility])   [place] [facility] [place], Salvadore Oxford, NP   1 year ago Aortic atherosclerosis [facility])   [place] [facility] [place], Salvadore Oxford, NP   1 year ago Hypokalemia   [place] [facility] [place], [place] W, NP   1 year ago Acute biliary pancreatitis without infection or necrosis   [place] [facility] [place], Salvadore Oxford, NP       Future Appointments             In 5 days [place], Salvadore Oxford, NP [place] [facility], [place]

## 2023-09-20 ENCOUNTER — Ambulatory Visit (INDEPENDENT_AMBULATORY_CARE_PROVIDER_SITE_OTHER): Payer: Medicare Other | Admitting: Internal Medicine

## 2023-09-20 ENCOUNTER — Encounter: Payer: Self-pay | Admitting: Internal Medicine

## 2023-09-20 VITALS — BP 130/70 | Ht 66.5 in | Wt 255.8 lb

## 2023-09-20 DIAGNOSIS — E782 Mixed hyperlipidemia: Secondary | ICD-10-CM

## 2023-09-20 DIAGNOSIS — I700 Atherosclerosis of aorta: Secondary | ICD-10-CM

## 2023-09-20 DIAGNOSIS — R7303 Prediabetes: Secondary | ICD-10-CM

## 2023-09-20 DIAGNOSIS — M5441 Lumbago with sciatica, right side: Secondary | ICD-10-CM | POA: Diagnosis not present

## 2023-09-20 DIAGNOSIS — L308 Other specified dermatitis: Secondary | ICD-10-CM | POA: Diagnosis not present

## 2023-09-20 DIAGNOSIS — N1831 Chronic kidney disease, stage 3a: Secondary | ICD-10-CM

## 2023-09-20 DIAGNOSIS — G4733 Obstructive sleep apnea (adult) (pediatric): Secondary | ICD-10-CM | POA: Diagnosis not present

## 2023-09-20 DIAGNOSIS — K580 Irritable bowel syndrome with diarrhea: Secondary | ICD-10-CM

## 2023-09-20 DIAGNOSIS — K219 Gastro-esophageal reflux disease without esophagitis: Secondary | ICD-10-CM

## 2023-09-20 DIAGNOSIS — M1711 Unilateral primary osteoarthritis, right knee: Secondary | ICD-10-CM

## 2023-09-20 DIAGNOSIS — E66813 Obesity, class 3: Secondary | ICD-10-CM

## 2023-09-20 DIAGNOSIS — K589 Irritable bowel syndrome without diarrhea: Secondary | ICD-10-CM | POA: Insufficient documentation

## 2023-09-20 DIAGNOSIS — Z6841 Body Mass Index (BMI) 40.0 and over, adult: Secondary | ICD-10-CM

## 2023-09-20 DIAGNOSIS — Z23 Encounter for immunization: Secondary | ICD-10-CM | POA: Diagnosis not present

## 2023-09-20 DIAGNOSIS — I10 Essential (primary) hypertension: Secondary | ICD-10-CM | POA: Diagnosis not present

## 2023-09-20 DIAGNOSIS — Z853 Personal history of malignant neoplasm of breast: Secondary | ICD-10-CM

## 2023-09-20 DIAGNOSIS — G8929 Other chronic pain: Secondary | ICD-10-CM

## 2023-09-20 MED ORDER — METHOCARBAMOL 500 MG PO TABS: ORAL_TABLET | ORAL | 1 refills | Status: AC

## 2023-09-20 MED ORDER — HYDROCHLOROTHIAZIDE 25 MG PO TABS
25.0000 mg | ORAL_TABLET | Freq: Every day | ORAL | 1 refills | Status: DC
Start: 2023-09-20 — End: 2024-03-21

## 2023-09-20 NOTE — Progress Notes (Signed)
Subjective:    Patient ID: Miranda Mueller, female    DOB: 1941-09-15, 82 y.o.   MRN: [id number]  HPI  Patient presents to clinic today for 107-month follow-up of chronic conditions.  History of breast cancer: In remission.  She gets yearly mammograms.  She no longer follows with oncology.  GERD: She is not sure what triggers this.  She denies breakthrough on esomeprazole.  Upper GI from 10/2003 reviewed.  HLD with aortic atherosclerosis: Her last LDL was 71, triglycerides 409, 12/2022.  She denies myalgias on atorvastatin.  She is taking aspirin as well.  She tries to consume a low-fat diet.  HTN: Her BP today is 130/70.  She is taking olmesartan, hctz and potassium as prescribed.  ECG from 01/2022 reviewed.  OSA: She averages 8 hours of sleep per night without the use of her CPAP.  There is no sleep study on file.  Chronic back/knee pain: She reports she had a gel injection in her right knee which provided some relief.  Managed with tylenol and methocarbamol as needed.  She follows with orthopedics  Eczema: Managed with betamethasone cream.  She does not follow with dermatology.  Prediabetes: Her last A1c was 5.8%, 12/2022.  She does not check her sugars.  She is not taking any oral diabetic medication at this time.  CKD 3: Her last creatinine was 1.06, GFR 53, 12/2018.  She is on olmesartan for renal protection.  She does not follow with nephrology.  Review of Systems     Past Medical History:  Diagnosis Date   Bradycardia    Breast cancer (HCC) 08/29/14   Right Breast -High Grade Invasive Mammary   Cataract    surgery   GERD (gastroesophageal reflux disease)    History of shingles    Hx of radiation therapy 10/01/14- 11/20/14   right breast/50.4 Gy/28 fx; right breast boost/10 Gy/5 fx   Hyperlipidemia    Hypertension    IBS (irritable bowel syndrome)    Multifocal PVCs    PVC (premature ventricular contraction)    Hx of   Sleep apnea    no cpap per pt     Current  Outpatient Medications  Medication Sig Dispense Refill   acetaminophen (TYLENOL) 500 MG tablet Take 500 mg by mouth every 6 (six) hours as needed.     aspirin 81 MG tablet Take 81 mg by mouth daily.     atorvastatin (LIPITOR) 10 MG tablet TAKE 1 TABLET DAILY 90 tablet 0   betamethasone dipropionate 0.05 % cream APPLY TOPICALLY AS NEEDED 30 g 11   cetirizine (ZYRTEC) 5 MG chewable tablet Chew 5 mg by mouth daily.     cycloSPORINE (RESTASIS) 0.05 % ophthalmic emulsion Place 1 drop into both eyes 2 (two) times daily.     esomeprazole (NEXIUM) 40 MG capsule TAKE 1 CAPSULE DAILY 90 capsule 0   fexofenadine (ALLEGRA) 180 MG tablet Take 180 mg by mouth daily. (Patient not taking: Reported on 09/03/2023)     fluticasone (FLONASE) 50 MCG/ACT nasal spray TAKE 2 SPRAYS INTO BOTH NOSTRILS DAILY (Patient taking differently: 2 sprays as needed for allergies. TAKE 2 SPRAYS INTO BOTH NOSTRILS DAILY) 16 g 4   hydrochlorothiazide (HYDRODIURIL) 25 MG tablet TAKE 1 TABLET DAILY 30 tablet 0   methocarbamol (ROBAXIN) 500 MG tablet TAKE 1 TABLET DAILY AS NEEDED FOR MUSCLE SPASMS 90 tablet 0   Multiple Vitamins-Minerals (HAIR/SKIN/NAILS PO) Take 1 tablet by mouth daily.     olmesartan (BENICAR) 20 MG  tablet TAKE 1 TABLET DAILY 90 tablet 0   polycarbophil (FIBERCON) 625 MG tablet Take 1 tablet (625 mg total) by mouth 2 (two) times daily. (Patient not taking: Reported on 09/03/2023) 60 tablet 0   Zinc 50 MG CAPS Take by mouth.     No current facility-administered medications for this visit.    No Known Allergies  Family History  Problem Relation Age of Onset   Hypertension Mother    Heart disease Mother        CAD   Breast cancer Mother 32   Hypertension Sister    Breast cancer Sister 81   Aneurysm Sister    Hypertension Brother    Prostate cancer Brother 21   Leukemia Maternal Uncle 5   Heart disease Maternal Grandmother    Leukemia Maternal Uncle 34   Breast cancer Cousin        dx under 50   Colon  cancer Cousin        dx in her 88s   Colon cancer Cousin    Lymphoma Other 49       NHL   Colon polyps Daughter    Stroke Father    Esophageal cancer Neg Hx    Stomach cancer Neg Hx    Rectal cancer Neg Hx     Social History   Socioeconomic History   Marital status: Married    Spouse name: Homer   Number of children: 1   Years of education: 12   Highest education level: Not on file  Occupational History    Comment: retired  Tobacco Use   Smoking status: Never   Smokeless tobacco: Never  Vaping Use   Vaping status: Never Used  Substance and Sexual Activity   Alcohol use: No    Alcohol/week: 0.0 standard drinks of alcohol   Drug use: No   Sexual activity: Never  Other Topics Concern   Not on file  Social History Narrative   Consumes one glass of caffeine daily   Social Determinants of Health   Financial Resource Strain: Low Risk  (09/03/2023)   Overall Financial Resource Strain (CARDIA)    Difficulty of Paying Living Expenses: Not hard at all  Food Insecurity: No Food Insecurity (09/03/2023)   Hunger Vital Sign    Worried About Running Out of Food in the Last Year: Never true    Ran Out of Food in the Last Year: Never true  Transportation Needs: No Transportation Needs (09/03/2023)   PRAPARE - Administrator, Civil Service (Medical): No    Lack of Transportation (Non-Medical): No  Physical Activity: Insufficiently Active (09/03/2023)   Exercise Vital Sign    Days of Exercise per Week: 7 days    Minutes of Exercise per Session: 10 min  Stress: No Stress Concern Present (09/03/2023)   [employer] of Occupational Health - Occupational Stress Questionnaire    Feeling of Stress : Not at all  Social Connections: Moderately Integrated (09/03/2023)   Social Connection and Isolation Panel [NHANES]    Frequency of Communication with Friends and Family: More than three times a week    Frequency of Social Gatherings with Friends and Family: Twice a week     Attends Religious Services: More than 4 times per year    Active Member of [employer] or Organizations: No    Attends Banker Meetings: Never    Marital Status: Married  Catering manager Violence: Not At Risk (09/03/2023)   Humiliation, Afraid, Rape, and  Kick questionnaire    Fear of Current or Ex-Partner: No    Emotionally Abused: No    Physically Abused: No    Sexually Abused: No     Constitutional: Denies fever, malaise, fatigue, headache or abrupt weight changes.  HEENT: Denies eye pain, eye redness, ear pain, ringing in the ears, wax buildup, runny nose, nasal congestion, bloody nose, or sore throat. Respiratory: Denies difficulty breathing, shortness of breath, cough or sputum production.   Cardiovascular: Denies chest pain, chest tightness, palpitations or swelling in the hands or feet.  Gastrointestinal: Pt reports abdominal cramping and loose stools. Denies bloating, constipation, or blood in the stool.  GU: Denies urgency, frequency, pain with urination, burning sensation, blood in urine, odor or discharge. Musculoskeletal: Patient reports chronic back/knee pain.  Denies decrease in range of motion, difficulty with gait, or joint swelling.  Skin: Patient reports dry skin.  Denies redness, rashes, lesions or ulcercations.  Neurological: Denies dizziness, difficulty with memory, difficulty with speech or problems with balance and coordination.  Psych: Denies anxiety, depression, SI/HI.  No other specific complaints in a complete review of systems (except as listed in HPI above).  Objective:   Physical Exam  BP (!) 148/67   Ht 5' 6.5" (1.689 m)   Wt 255 lb 12.8 oz (116 kg)   BMI 40.67 kg/m    Wt Readings from Last 3 Encounters:  09/03/23 257 lb 6.4 oz (116.8 kg)  02/10/23 247 lb (112 kg)  01/06/23 247 lb (112 kg)    General: Appears her stated age, obese in NAD. Skin: Warm, dry and intact.  HEENT: Head: normal shape and size; Eyes: sclera white, no  icterus, conjunctiva pink, PERRLA and EOMs intact;  Cardiovascular: Normal rate and rhythm. S1,S2 noted.  No murmur, rubs or gallops noted. No JVD or BLE edema. No carotid bruits noted. Pulmonary/Chest: Normal effort and positive vesicular breath sounds. No respiratory distress. No wheezes, rales or ronchi noted.  Abdomen: Soft and nontender. Normal bowel sounds.  Musculoskeletal: Pain with palpation over the lumbar spine.  Joint enlargement noted of bilateral knees.  Gait slow and steady without device. Neurological: Alert and oriented. Cranial nerves II-XII grossly intact. Coordination normal.  Psychiatric: Mood and affect normal. Behavior is normal. Judgment and thought content normal.    BMET    Component Value Date/Time   NA 138 01/06/2023 0826   NA 138 08/22/2014 1237   K 4.1 01/06/2023 0826   K 3.8 08/22/2014 1237   CL 100 01/06/2023 0826   CO2 28 01/06/2023 0826   CO2 28 08/22/2014 1237   GLUCOSE 104 (H) 01/06/2023 0826   GLUCOSE 102 08/22/2014 1237   BUN 18 01/06/2023 0826   BUN 16.0 08/22/2014 1237   CREATININE 1.06 (H) 01/06/2023 0826   CREATININE 1.1 08/22/2014 1237   CALCIUM 9.9 01/06/2023 0826   CALCIUM 9.8 08/22/2014 1237   GFRNONAA 48 (L) 01/18/2022 1620   GFRAA 72 12/01/2007 1105    Lipid Panel     Component Value Date/Time   CHOL 142 01/06/2023 0826   TRIG 150 (H) 01/06/2023 0826   HDL 48 (L) 01/06/2023 0826   CHOLHDL 3.0 01/06/2023 0826   VLDL 29.8 07/02/2020 1009   LDLCALC 71 01/06/2023 0826    CBC    Component Value Date/Time   WBC 6.7 01/06/2023 0826   RBC 4.68 01/06/2023 0826   HGB 13.7 01/06/2023 0826   HGB 13.9 08/22/2014 1237   HCT 41.3 01/06/2023 0826   HCT 42.7 08/22/2014  1237   PLT 418 (H) 01/06/2023 0826   PLT 345 08/22/2014 1237   MCV 88.2 01/06/2023 0826   MCV 88.7 08/22/2014 1237   MCH 29.3 01/06/2023 0826   MCHC 33.2 01/06/2023 0826   RDW 12.4 01/06/2023 0826   RDW 13.8 08/22/2014 1237   LYMPHSABS 2.9 08/22/2014 1237    MONOABS 0.8 08/22/2014 1237   EOSABS 0.4 08/22/2014 1237   BASOSABS 0.1 08/22/2014 1237    Hgb A1C Lab Results  Component Value Date   HGBA1C 5.8 (H) 01/06/2023           Assessment & Plan:     RTC in 6 months for follow-up of chronic conditions Nicki Reaper, NP

## 2023-09-20 NOTE — Assessment & Plan Note (Signed)
C-Met and lipid profile today Encouraged her to consume low-fat diet Continue atorvastatin 

## 2023-09-20 NOTE — Assessment & Plan Note (Signed)
A1c today Encouraged her consume a low carb diet and exercise for weight loss

## 2023-09-20 NOTE — Assessment & Plan Note (Signed)
Encourage regular stretching and core strengthening Continue Tylenol and methocarbamol as needed

## 2023-09-20 NOTE — Assessment & Plan Note (Addendum)
Encourage weight loss as this can reduce sleep apnea symptoms Noncompliant with CPAP 

## 2023-09-20 NOTE — Patient Instructions (Signed)
Diet for Irritable Bowel Syndrome When you have irritable bowel syndrome (IBS), it is very important to follow the eating habits that are best for your condition. IBS may cause various symptoms, such as pain in the abdomen, constipation, or diarrhea. Choosing the right foods can help to ease the discomfort from these symptoms. Work with your health care provider and dietitian to find the eating plan that will help to control your symptoms. What are tips for following this plan?  Keep a food diary. This will help you identify foods that cause symptoms. Write down: What you eat and when you eat it. What symptoms you have. When symptoms occur in relation to your meals, such as "pain in abdomen 2 hours after dinner." Eat your meals slowly and in a relaxed setting. Aim to eat 5-6 small meals per day. Do not skip meals. Drink enough fluid to keep your urine pale yellow. Ask your health care provider if you should take an over-the-counter probiotic to help restore healthy bacteria in your gut (digestive tract). Probiotics are foods that contain good bacteria and yeasts. Your dietitian may have specific dietary recommendations for you based on your symptoms. Your dietitian may recommend that you: Avoid foods that cause symptoms. Talk with your dietitian about other ways to get the same nutrients that are in those problem foods. Avoid foods with gluten. Gluten is a protein that is found in rye, wheat, and barley. Eat more foods that contain soluble fiber. Examples of foods with high soluble fiber include oats, seeds, and certain fruits and vegetables. Take a fiber supplement if told by your dietitian. Reduce or avoid certain foods called FODMAPs. These are foods that contain sugars that are hard for some people to digest. Ask your health care provider which foods to avoid. What foods should I avoid? The following are some foods and drinks that may make your symptoms worse: Fatty foods, such as french  fries. Foods that contain gluten, such as pasta and cereal. Dairy products, such as milk, cheese, and ice cream. Spicy foods. Alcohol. Products with caffeine, such as coffee, tea, or chocolate. Carbonated drinks, such as soda. Foods that are high in FODMAPs. These include certain fruits and vegetables. Products with sweeteners such as honey, high fructose corn syrup, sorbitol, and mannitol. The items listed above may not be a complete list of foods and beverages you should avoid. Contact a dietitian for more information. What foods are good sources of fiber? Your health care provider or dietitian may recommend that you eat more foods that contain fiber. Fiber can help to reduce constipation and other IBS symptoms. Add foods with fiber to your diet a little at a time so your body can get used to them. Too much fiber at one time might cause gas and swelling of your abdomen. The following are some foods that are good sources of fiber: Berries, such as raspberries, strawberries, and blueberries. Tomatoes. Carrots. Brown rice. Oats. Seeds, such as chia and pumpkin seeds. The items listed above may not be a complete list of recommended sources of fiber. Contact your dietitian for more options. Where to find more information International Foundation for Functional Gastrointestinal Disorders: aboutibs.org National Institute of Diabetes and Digestive and Kidney Diseases: niddk.nih.gov Summary When you have irritable bowel syndrome (IBS), it is very important to follow the eating habits that are best for your condition. IBS may cause various symptoms, such as pain in the abdomen, constipation, or diarrhea. Choosing the right foods can help to ease the   discomfort that comes from symptoms. Your health care provider or dietitian may recommend that you eat more foods that contain fiber. Keep a food diary. This will help you identify foods that cause symptoms. This information is not intended to replace  advice given to you by your health care provider. Make sure you discuss any questions you have with your health care provider. Document Revised: 09/09/2021 Document Reviewed: 09/09/2021 Elsevier Patient Education  2024 [employer].

## 2023-09-20 NOTE — Assessment & Plan Note (Signed)
Try to identify and not avoid specific foods that cause her symptoms Okay to take Imodium OTC as needed Discuss dicyclomine however she would like to hold off on this medication at this time

## 2023-09-20 NOTE — Assessment & Plan Note (Signed)
C-Met and lipid profile today Encouraged her to consume low-fat diet Continue atorvastatin and aspirin 

## 2023-09-20 NOTE — Assessment & Plan Note (Signed)
Controlled on olmesartan, HCTZ and potassium Reinforced DASH diet and exercise for weight loss C-Met today

## 2023-09-20 NOTE — Assessment & Plan Note (Signed)
C-Met today Continue olmesartan for renal protection

## 2023-09-20 NOTE — Assessment & Plan Note (Signed)
Avoid foods that trigger reflux Encouraged weight loss as this can help reduce reflux symptoms Continue esomeprazole, has failed previous wean in the past

## 2023-09-20 NOTE — Assessment & Plan Note (Signed)
Continue yearly mammograms 

## 2023-09-20 NOTE — Assessment & Plan Note (Signed)
Continue Tylenol OTC as needed Encourage weight loss as this can help reduce joint pain She will continue to follow with orthopedics

## 2023-09-20 NOTE — Assessment & Plan Note (Signed)
Continue betamethasone cream as needed

## 2023-09-20 NOTE — Assessment & Plan Note (Signed)
Encourage diet and exercise for weight loss 

## 2023-09-21 LAB — COMPLETE METABOLIC PANEL WITH GFR
AG Ratio: 1.3 (calc) (ref 1.0–2.5)
ALT: 20 U/L (ref 6–29)
AST: 17 U/L (ref 10–35)
Albumin: 4.3 g/dL (ref 3.6–5.1)
Alkaline phosphatase (APISO): 54 U/L (ref 37–153)
BUN: 16 mg/dL (ref 7–25)
CO2: 27 mmol/L (ref 20–32)
Calcium: 9.4 mg/dL (ref 8.6–10.4)
Chloride: 100 mmol/L (ref 98–110)
Creat: 0.94 mg/dL (ref 0.60–0.95)
Globulin: 3.2 g/dL (ref 1.9–3.7)
Glucose, Bld: 103 mg/dL — ABNORMAL HIGH (ref 65–99)
Potassium: 3.9 mmol/L (ref 3.5–5.3)
Sodium: 139 mmol/L (ref 135–146)
Total Bilirubin: 0.5 mg/dL (ref 0.2–1.2)
Total Protein: 7.5 g/dL (ref 6.1–8.1)
eGFR: 61 mL/min/{1.73_m2} (ref >=60)

## 2023-09-21 LAB — CBC
HCT: 40.8 % (ref 35.0–45.0)
Hemoglobin: 13.7 g/dL (ref 11.7–15.5)
MCH: 29.2 pg (ref 27.0–33.0)
MCHC: 33.6 g/dL (ref 32.0–36.0)
MCV: 87 fL (ref 80.0–100.0)
MPV: 11.4 fL (ref 7.5–12.5)
Platelets: 397 10*3/uL (ref 140–400)
RBC: 4.69 10*6/uL (ref 3.80–5.10)
RDW: 12.3 % (ref 11.0–15.0)
WBC: 5.9 10*3/uL (ref 3.8–10.8)

## 2023-09-21 LAB — LIPID PANEL
Cholesterol: 134 mg/dL (ref <200)
HDL: 47 mg/dL — ABNORMAL LOW (ref >=50)
LDL Cholesterol (Calc): 65 mg/dL
Non-HDL Cholesterol (Calc): 87 mg/dL (ref <130)
Total CHOL/HDL Ratio: 2.9 (calc) (ref <5.0)
Triglycerides: 132 mg/dL (ref <150)

## 2023-09-21 LAB — HEMOGLOBIN A1C
Hgb A1c MFr Bld: 5.9 %{Hb} — ABNORMAL HIGH (ref <5.7)
Mean Plasma Glucose: 123 mg/dL
eAG (mmol/L): 6.8 mmol/L

## 2023-10-04 ENCOUNTER — Other Ambulatory Visit: Payer: Self-pay | Admitting: Internal Medicine

## 2023-10-04 DIAGNOSIS — I10 Essential (primary) hypertension: Secondary | ICD-10-CM

## 2023-10-05 NOTE — Telephone Encounter (Signed)
Requested Prescriptions  Pending Prescriptions Disp Refills   olmesartan (BENICAR) 20 MG tablet [Pharmacy Med Name: OLMESARTAN MEDOXOMIL TABS 20MG ] 90 tablet 0    Sig: TAKE 1 TABLET DAILY     Cardiovascular:  Angiotensin Receptor Blockers Passed - 10/05/2023 11:06 AM      Passed - Cr in normal range and within 180 days    Creatinine  Date Value Ref Range Status  08/22/2014 1.1 0.6 - 1.1 mg/dL Final   Creat  Date Value Ref Range Status  09/20/2023 0.94 0.60 - 0.95 mg/dL Final         Passed - K in normal range and within 180 days    Potassium  Date Value Ref Range Status  09/20/2023 3.9 3.5 - 5.3 mmol/L Final  08/22/2014 3.8 3.5 - 5.1 mEq/L Final         Passed - Patient is not pregnant      Passed - Last BP in normal range    BP Readings from Last 1 Encounters:  09/20/23 130/70         Passed - Valid encounter within last 6 months    Recent Outpatient Visits           2 weeks ago Prediabetes   [place] [facility] [place], Salvadore Oxford, NP   9 months ago Mixed hyperlipidemia   [place] [facility] [place], Salvadore Oxford, NP   1 year ago Aortic atherosclerosis [facility])   [place] [facility] [place], Salvadore Oxford, NP   1 year ago Aortic atherosclerosis [facility])   [place] [facility] [place], Salvadore Oxford, NP   1 year ago Hypokalemia   [place] [facility] [place], Salvadore Oxford, NP       Future Appointments             In 5 months Baity, Salvadore Oxford, NP [place] [facility] [place]

## 2023-11-02 DIAGNOSIS — H16223 Keratoconjunctivitis sicca, not specified as Sjogren's, bilateral: Secondary | ICD-10-CM | POA: Diagnosis not present

## 2023-11-02 DIAGNOSIS — B0239 Other herpes zoster eye disease: Secondary | ICD-10-CM | POA: Diagnosis not present

## 2023-11-02 DIAGNOSIS — Z961 Presence of intraocular lens: Secondary | ICD-10-CM | POA: Diagnosis not present

## 2023-12-14 ENCOUNTER — Other Ambulatory Visit: Payer: Self-pay | Admitting: Internal Medicine

## 2023-12-15 NOTE — Telephone Encounter (Signed)
 Requested Prescriptions  Pending Prescriptions Disp Refills   esomeprazole (NEXIUM) 40 MG capsule [Pharmacy Med Name: ESOMEPRAZOLE MAGNESIUM DR CAPS 40MG ] 90 capsule 1    Sig: TAKE 1 CAPSULE DAILY     Gastroenterology: Proton Pump Inhibitors 2 Passed - 12/15/2023  8:37 AM      Passed - ALT in normal range and within 360 days    ALT  Date Value Ref Range Status  09/20/2023 20 6 - 29 U/L Final  08/22/2014 22 0 - 55 U/L Final         Passed - AST in normal range and within 360 days    AST  Date Value Ref Range Status  09/20/2023 17 10 - 35 U/L Final  08/22/2014 17 5 - 34 U/L Final         Passed - Valid encounter within last 12 months    Recent Outpatient Visits           2 months ago Prediabetes   [place] [facility] [place], Salvadore Oxford, NP   11 months ago Mixed hyperlipidemia   [place] [facility] [place], Salvadore Oxford, NP   1 year ago Aortic atherosclerosis [facility])   [place] [facility] [place], Salvadore Oxford, NP   1 year ago Aortic atherosclerosis [facility])   [place] [facility] [place], Salvadore Oxford, NP   1 year ago Hypokalemia   [place] [facility] [place], Salvadore Oxford, NP       Future Appointments             In 3 months Baity, Salvadore Oxford, NP [facility]             atorvastatin (LIPITOR) 10 MG tablet [Pharmacy Med Name: ATORVASTATIN TABS 10MG ] 90 tablet 1    Sig: TAKE 1 TABLET DAILY     Cardiovascular:  Antilipid - Statins Failed - 12/15/2023  8:37 AM      Failed - Lipid Panel in normal range within the last 12 months    Cholesterol  Date Value Ref Range Status  09/20/2023 134 <200 mg/dL Final   LDL Cholesterol (Calc)  Date Value Ref Range Status  09/20/2023 65 mg/dL (calc) Final    Comment:    Reference range: <100 . Desirable range <100 mg/dL for primary prevention;   <70 mg/dL for patients with CHD or diabetic patients  with > or = 2 CHD risk  factors. Marland Kitchen LDL-C is now calculated using the Martin-Hopkins  calculation, which is a validated novel method providing  better accuracy than the Friedewald equation in the  estimation of LDL-C.  Horald Pollen et al. Lenox Ahr. 2956;213(08): 2061-2068  (http://education.QuestDiagnostics.com/faq/FAQ164)    Direct LDL  Date Value Ref Range Status  07/18/2012 153.2 mg/dL Final    Comment:    Optimal:  <100 mg/dLNear or Above Optimal:  100-129 mg/dLBorderline High:  130-159 mg/dLHigh:  160-189 mg/dLVery High:  >190 mg/dL   HDL  Date Value Ref Range Status  09/20/2023 47 (L) > OR = 50 mg/dL Final   Triglycerides  Date Value Ref Range Status  09/20/2023 132 <150 mg/dL Final         Passed - Patient is not pregnant      Passed - Valid encounter within last 12 months    Recent Outpatient Visits           2 months ago Prediabetes   [facility] [place], Salvadore Oxford, NP   11 months ago Mixed hyperlipidemia   [place] [facility] [place], Salvadore Oxford, NP   1 year ago Aortic atherosclerosis [facility])   [place] [facility] [place], Salvadore Oxford, NP   1 year ago Aortic atherosclerosis [facility])   [place] [facility] [place], Salvadore Oxford, NP   1 year ago Hypokalemia   [place] [facility] [place], Salvadore Oxford, NP       Future Appointments             In 3 months Baity, Salvadore Oxford, NP [place] [facility]

## 2024-01-03 ENCOUNTER — Other Ambulatory Visit: Payer: Self-pay | Admitting: Internal Medicine

## 2024-01-03 DIAGNOSIS — I10 Essential (primary) hypertension: Secondary | ICD-10-CM

## 2024-01-04 NOTE — Telephone Encounter (Signed)
 Requested by interface surescripts. Future visit in 2 months .  Requested Prescriptions  Pending Prescriptions Disp Refills   olmesartan (BENICAR) 20 MG tablet [Pharmacy Med Name: OLMESARTAN MEDOXOMIL TABS 20MG ] 90 tablet 0    Sig: TAKE 1 TABLET DAILY     Cardiovascular:  Angiotensin Receptor Blockers Passed - 01/04/2024 11:01 AM      Passed - Cr in normal range and within 180 days    Creatinine  Date Value Ref Range Status  08/22/2014 1.1 0.6 - 1.1 mg/dL Final   Creat  Date Value Ref Range Status  09/20/2023 0.94 0.60 - 0.95 mg/dL Final         Passed - K in normal range and within 180 days    Potassium  Date Value Ref Range Status  09/20/2023 3.9 3.5 - 5.3 mmol/L Final  08/22/2014 3.8 3.5 - 5.1 mEq/L Final         Passed - Patient is not pregnant      Passed - Last BP in normal range    BP Readings from Last 1 Encounters:  09/20/23 130/70         Passed - Valid encounter within last 6 months    Recent Outpatient Visits           3 months ago Prediabetes   [place] [facility] [place], Salvadore Oxford, NP   12 months ago Mixed hyperlipidemia   [place] [facility] [place], Salvadore Oxford, NP   1 year ago Aortic atherosclerosis [facility])   [place] [facility] [place], Salvadore Oxford, NP   1 year ago Aortic atherosclerosis [facility])   [place] [facility], Salvadore Oxford, NP   1 year ago Hypokalemia   [place] [facility] [place], Salvadore Oxford, NP       Future Appointments             In 2 months Baity, Salvadore Oxford, NP [place] [facility]

## 2024-01-18 DIAGNOSIS — Z1231 Encounter for screening mammogram for malignant neoplasm of breast: Secondary | ICD-10-CM | POA: Diagnosis not present

## 2024-03-21 ENCOUNTER — Encounter: Payer: Self-pay | Admitting: Internal Medicine

## 2024-03-21 ENCOUNTER — Ambulatory Visit (INDEPENDENT_AMBULATORY_CARE_PROVIDER_SITE_OTHER): Payer: Self-pay | Admitting: Internal Medicine

## 2024-03-21 VITALS — BP 130/84 | Ht 66.5 in | Wt 253.6 lb

## 2024-03-21 DIAGNOSIS — M79641 Pain in right hand: Secondary | ICD-10-CM

## 2024-03-21 DIAGNOSIS — M25561 Pain in right knee: Secondary | ICD-10-CM | POA: Diagnosis not present

## 2024-03-21 DIAGNOSIS — M25531 Pain in right wrist: Secondary | ICD-10-CM

## 2024-03-21 DIAGNOSIS — M5441 Lumbago with sciatica, right side: Secondary | ICD-10-CM | POA: Diagnosis not present

## 2024-03-21 DIAGNOSIS — R7303 Prediabetes: Secondary | ICD-10-CM

## 2024-03-21 DIAGNOSIS — I700 Atherosclerosis of aorta: Secondary | ICD-10-CM | POA: Diagnosis not present

## 2024-03-21 DIAGNOSIS — G8929 Other chronic pain: Secondary | ICD-10-CM | POA: Insufficient documentation

## 2024-03-21 DIAGNOSIS — K219 Gastro-esophageal reflux disease without esophagitis: Secondary | ICD-10-CM | POA: Diagnosis not present

## 2024-03-21 DIAGNOSIS — M25562 Pain in left knee: Secondary | ICD-10-CM | POA: Diagnosis not present

## 2024-03-21 DIAGNOSIS — L308 Other specified dermatitis: Secondary | ICD-10-CM

## 2024-03-21 DIAGNOSIS — N1831 Chronic kidney disease, stage 3a: Secondary | ICD-10-CM | POA: Diagnosis not present

## 2024-03-21 DIAGNOSIS — E782 Mixed hyperlipidemia: Secondary | ICD-10-CM | POA: Diagnosis not present

## 2024-03-21 DIAGNOSIS — G4733 Obstructive sleep apnea (adult) (pediatric): Secondary | ICD-10-CM | POA: Diagnosis not present

## 2024-03-21 DIAGNOSIS — Z853 Personal history of malignant neoplasm of breast: Secondary | ICD-10-CM

## 2024-03-21 DIAGNOSIS — I10 Essential (primary) hypertension: Secondary | ICD-10-CM

## 2024-03-21 DIAGNOSIS — E66813 Obesity, class 3: Secondary | ICD-10-CM

## 2024-03-21 MED ORDER — HYDROCHLOROTHIAZIDE 25 MG PO TABS
25.0000 mg | ORAL_TABLET | Freq: Every day | ORAL | 1 refills | Status: DC
Start: 2024-03-21 — End: 2024-08-29

## 2024-03-21 MED ORDER — SEMAGLUTIDE-WEIGHT MANAGEMENT 0.25 MG/0.5ML ~~LOC~~ SOAJ
0.2500 mg | SUBCUTANEOUS | 0 refills | Status: DC
Start: 2024-03-21 — End: 2024-04-03

## 2024-03-21 MED ORDER — OLMESARTAN MEDOXOMIL 20 MG PO TABS: 20.0000 mg | ORAL_TABLET | Freq: Every day | ORAL | 1 refills | Status: DC

## 2024-03-21 MED ORDER — ESOMEPRAZOLE MAGNESIUM 40 MG PO CPDR: 40.0000 mg | DELAYED_RELEASE_CAPSULE | Freq: Every day | ORAL | 1 refills | Status: AC

## 2024-03-21 MED ORDER — ATORVASTATIN CALCIUM 10 MG PO TABS: 10.0000 mg | ORAL_TABLET | Freq: Every day | ORAL | 1 refills | Status: AC

## 2024-03-21 NOTE — Assessment & Plan Note (Signed)
 Avoid foods that trigger reflux Encouraged weight loss as this can help reduce reflux symptoms Continue esomeprazole  40 mg daily, has failed previous wean in the past

## 2024-03-21 NOTE — Assessment & Plan Note (Signed)
 C-Met and lipid profile today Encouraged her to consume low-fat diet Continue atorvastatin  10 mg daily

## 2024-03-21 NOTE — Assessment & Plan Note (Signed)
A1c today Encouraged her consume a low carb diet and exercise for weight loss

## 2024-03-21 NOTE — Assessment & Plan Note (Signed)
 Controlled on olmesartan  20 mg, HCTZ 25 mg and potassium Reinforced DASH diet and exercise for weight loss C-Met today

## 2024-03-21 NOTE — Assessment & Plan Note (Signed)
 Continue betamethasone  0.05% cream as needed

## 2024-03-21 NOTE — Assessment & Plan Note (Signed)
 Encourage regular stretching and core strengthening Encourage weight loss as this can help reduce joint pain Continue tylenol  500 mg 3 times daily as needed and methocarbamol  500 mg daily as needed Will check ANA, ESR, CRP and rheumatoid factor today Referral to rheumatology placed per patient request

## 2024-03-21 NOTE — Assessment & Plan Note (Signed)
 Encourage diet and exercise for weight loss Will trial wegovy 0.25 mg weekly for weight loss given her history of CKD, aortic atherosclerosis, prediabetes and sleep apnea

## 2024-03-21 NOTE — Assessment & Plan Note (Signed)
 C-Met and lipid profile today Encouraged her to consume low-fat diet Continue atorvastatin  10 mg and aspirin 81 mg daily

## 2024-03-21 NOTE — Assessment & Plan Note (Signed)
 Encourage regular stretching and core strengthening Continue tylenol  500 mg 3 times daily as needed and methocarbamol  500 mg daily as needed Will check ANA, ESR, CRP and rheumatoid factor today Referral to rheumatology placed per patient request

## 2024-03-21 NOTE — Assessment & Plan Note (Signed)
 C-Met today Continue olmesartan  20 mg daily for renal protection

## 2024-03-21 NOTE — Patient Instructions (Signed)
 Joint Pain  Joint pain can be caused by many things. It may go away if you follow instructions from your health care provider for taking care of yourself at home. Sometimes, you may need more treatment. Joint pain can be caused by: Bruises at the area of the joint. An injury caused by movements that are repeated. Wear and tear on the joint as you get older. Buildup of uric acid crystals in the joint. This is also called gout. Irritation and swelling of the joint. Types of arthritis. Infections of the joint or of the bone. Your provider may tell you to take pain medicine or wear an elastic bandage, sling, or splint. If your joint pain continues, you may need lab or imaging tests to find the cause of your joint pain. Follow these instructions at home: If you have an elastic bandage, sling, or splint that can be taken off: Wear the bandage, sling, or splint as told by your provider. Take it off only if your provider says you can. Check the skin under and around it every day. Tell your provider if you see problems. Loosen it if your fingers or toes tingle, are numb, or turn cold and blue. Keep it clean and dry. Ask your provider if you should remove it before bathing. If the bandage, sling, or splint is not waterproof: Do not let it get wet. Cover it when you take a bath or shower. Use a cover that does not let any water in. Managing pain, stiffness, and swelling     If told, put ice on the area. If you have an elastic bandage, sling, or splint that you can take off, remove it as told. Put ice in a plastic bag. Place a towel between your skin and the bag. Leave the ice on for 20 minutes, 2-3 times a day. If told, put heat on the area. Do this as often as told. Use the heat source that your provider recommends, such as a moist heat pack or a heating pad. Place a towel between your skin and the heat source. Leave the heat on for 20-30 minutes. If your skin turns bright red, take off the  ice or heat right away to prevent skin damage. The risk of damage is higher if you can't feel pain, heat, or cold. Move your fingers or toes often to reduce stiffness and swelling. Raise the injured area above the level of your heart while you're sitting or lying down. Use a pillow to support the painful area as needed. Activity Rest the painful joint as told. Do not do things that cause pain or make pain worse. Begin exercising or stretching the affected area as told by your provider. Return to normal activities when you are told. Ask what things are safe for you to do. General instructions Take your medicines as told by your provider. Treatment may include medicines for pain and swelling that are taken by mouth or applied to the skin. Do not smoke, vape, or use products with nicotine or tobacco in them. If you need help quitting, talk with your provider. Keep all follow-up visits. Your provider will want to check on your condition. Contact a health care provider if: You have pain that does not get better with medicine. Your joint pain does not improve within 3 days. You have more bruising or swelling. You have a fever. You lose 10 lb (4.5 kg) or more without trying. Get help right away if: You cannot move the joint. Your fingers  or toes tingle, become numb, or turn cold and blue. You have a fever along with a joint that's red, warm, and swollen. This information is not intended to replace advice given to you by your health care provider. Make sure you discuss any questions you have with your health care provider. Document Revised: 07/01/2023 Document Reviewed: 12/11/2022 Elsevier Patient Education  2024 [employer].

## 2024-03-21 NOTE — Progress Notes (Signed)
 Subjective:    Patient ID: Miranda Mueller, female    DOB: Oct 30, 1940, 83 y.o.   MRN: [id number]  HPI  Patient presents to clinic today for 71-month follow-up of chronic conditions.  History of breast cancer: In remission.  She gets yearly mammograms.  She no longer follows with oncology.  GERD: She is not sure what triggers this.  She has occasional breakthrough on esomeprazole  for which she takes mylanta with good results.  There is no upper GI on file.  HLD with aortic atherosclerosis: Her last LDL was 65, triglycerides 696, 09/2023.  She denies myalgias on atorvastatin .  She is taking aspirin as well.  She tries to consume a low-fat diet.  HTN: Her BP today is 130/84.  She is taking olmesartan , hctz and potassium as prescribed.  ECG from 01/2022 reviewed.  OSA: She averages 8 hours of sleep per night without the use of her CPAP.  There is no sleep study on file.  Chronic back/knee pain:  She has knee injections in the past.  She has been having pain in her right hand and right wrist.  She is unsure if this is arthritis but would like to go seek Dr. Milton Alpers in [place] for further evaluation.  Managed with tylenol  and methocarbamol  as needed.  She follows with orthopedics  Eczema: Managed with betamethasone  cream.  She does not follow with dermatology.  Prediabetes: Her last A1c was 5.9%, 09/2023.  She does not check her sugars.  She is not taking any oral diabetic medication at this time.  CKD 3: Her last creatinine was 0.94, GFR 61, 09/2023.  She is on olmesartan  for renal protection.  She does not follow with nephrology.  Review of Systems     Past Medical History:  Diagnosis Date   Bradycardia    Breast cancer (HCC) 08/29/14   Right Breast -High Grade Invasive Mammary   Cataract    surgery   GERD (gastroesophageal reflux disease)    History of shingles    Hx of radiation therapy 10/01/14- 11/20/14   right breast/50.4 Gy/28 fx; right breast boost/10 Gy/5 fx    Hyperlipidemia    Hypertension    IBS (irritable bowel syndrome)    Multifocal PVCs    PVC (premature ventricular contraction)    Hx of   Sleep apnea    no cpap per pt     Current Outpatient Medications  Medication Sig Dispense Refill   acetaminophen  (TYLENOL ) 500 MG tablet Take 500 mg by mouth every 6 (six) hours as needed.     aspirin 81 MG tablet Take 81 mg by mouth daily.     atorvastatin  (LIPITOR) 10 MG tablet TAKE 1 TABLET DAILY 90 tablet 1   betamethasone  dipropionate 0.05 % cream APPLY TOPICALLY AS NEEDED 30 g 11   cetirizine (ZYRTEC) 5 MG chewable tablet Chew 5 mg by mouth daily.     esomeprazole  (NEXIUM ) 40 MG capsule TAKE 1 CAPSULE DAILY 90 capsule 1   fluticasone  (FLONASE ) 50 MCG/ACT nasal spray TAKE 2 SPRAYS INTO BOTH NOSTRILS DAILY (Patient taking differently: 2 sprays as needed for allergies. TAKE 2 SPRAYS INTO BOTH NOSTRILS DAILY) 16 g 4   hydrochlorothiazide  (HYDRODIURIL ) 25 MG tablet Take 1 tablet (25 mg total) by mouth daily. 90 tablet 1   methocarbamol  (ROBAXIN ) 500 MG tablet TAKE 1 TABLET DAILY AS NEEDED FOR MUSCLE SPASMS 90 tablet 1   Multiple Vitamins-Minerals (HAIR/SKIN/NAILS PO) Take 1 tablet by mouth daily.     olmesartan  (  BENICAR ) 20 MG tablet TAKE 1 TABLET DAILY 90 tablet 0   polycarbophil (FIBERCON) 625 MG tablet Take 1 tablet (625 mg total) by mouth 2 (two) times daily. 60 tablet 0   Zinc  50 MG CAPS Take by mouth.     No current facility-administered medications for this visit.    No Known Allergies  Family History  Problem Relation Age of Onset   Hypertension Mother    Heart disease Mother        CAD   Breast cancer Mother 70   Hypertension Sister    Breast cancer Sister 50   Aneurysm Sister    Hypertension Brother    Prostate cancer Brother 19   Leukemia Maternal Uncle 66   Heart disease Maternal Grandmother    Leukemia Maternal Uncle 65   Breast cancer Cousin        dx under 50   Colon cancer Cousin        dx in her 25s   Colon cancer  Cousin    Lymphoma Other 49       NHL   Colon polyps Daughter    Stroke Father    Esophageal cancer Neg Hx    Stomach cancer Neg Hx    Rectal cancer Neg Hx     Social History   Socioeconomic History   Marital status: Married    Spouse name: Homer   Number of children: 1   Years of education: 12   Highest education level: Not on file  Occupational History    Comment: retired  Tobacco Use   Smoking status: Never   Smokeless tobacco: Never  Vaping Use   Vaping status: Never Used  Substance and Sexual Activity   Alcohol use: No    Alcohol/week: 0.0 standard drinks of alcohol   Drug use: No   Sexual activity: Never  Other Topics Concern   Not on file  Social History Narrative   Consumes one glass of caffeine daily   Social Drivers of Health   Financial Resource Strain: Low Risk  (09/20/2023)   Overall Financial Resource Strain (CARDIA)    Difficulty of Paying Living Expenses: Not hard at all  Food Insecurity: No Food Insecurity (09/20/2023)   Hunger Vital Sign    Worried About Running Out of Food in the Last Year: Never true    Ran Out of Food in the Last Year: Never true  Transportation Needs: No Transportation Needs (09/20/2023)   PRAPARE - Administrator, Civil Service (Medical): No    Lack of Transportation (Non-Medical): No  Physical Activity: Insufficiently Active (09/20/2023)   Exercise Vital Sign    Days of Exercise per Week: 1 day    Minutes of Exercise per Session: 10 min  Stress: No Stress Concern Present (09/20/2023)   [employer] of Occupational Health - Occupational Stress Questionnaire    Feeling of Stress : Not at all  Social Connections: Moderately Integrated (09/20/2023)   Social Connection and Isolation Panel [NHANES]    Frequency of Communication with Friends and Family: More than three times a week    Frequency of Social Gatherings with Friends and Family: More than three times a week    Attends Religious Services: More than 4  times per year    Active Member of [employer] or Organizations: No    Attends Banker Meetings: Never    Marital Status: Married  Catering manager Violence: Not At Risk (09/20/2023)   Humiliation, Afraid, Rape, and  Kick questionnaire    Fear of Current or Ex-Partner: No    Emotionally Abused: No    Physically Abused: No    Sexually Abused: No     Constitutional: Denies fever, malaise, fatigue, headache or abrupt weight changes.  HEENT: Denies eye pain, eye redness, ear pain, ringing in the ears, wax buildup, runny nose, nasal congestion, bloody nose, or sore throat. Respiratory: Denies difficulty breathing, shortness of breath, cough or sputum production.   Cardiovascular: Denies chest pain, chest tightness, palpitations or swelling in the hands or feet.  Gastrointestinal: Denies abdominal pain, bloating, constipation, diarrhea or blood in the stool.  GU: Denies urgency, frequency, pain with urination, burning sensation, blood in urine, odor or discharge. Musculoskeletal: Patient reports chronic back/knee pain, chronic right wrist and hand pain.  Denies decrease in range of motion, difficulty with gait, or joint swelling.  Skin: Patient reports dry skin.  Denies redness, rashes, lesions or ulcercations.  Neurological: Denies dizziness, difficulty with memory, difficulty with speech or problems with balance and coordination.  Psych: Denies anxiety, depression, SI/HI.  No other specific complaints in a complete review of systems (except as listed in HPI above).  Objective:   Physical Exam  BP 130/84 (BP Location: Left Arm, Patient Position: Sitting, Cuff Size: Large)   Ht 5' 6.5" (1.689 m)   Wt 253 lb 9.6 oz (115 kg)   BMI 40.32 kg/m     Wt Readings from Last 3 Encounters:  09/20/23 255 lb 12.8 oz (116 kg)  09/03/23 257 lb 6.4 oz (116.8 kg)  02/10/23 247 lb (112 kg)    General: Appears her stated age, obese in NAD. Skin: Warm, dry and intact.  No rashes or bruising  noted. HEENT: Head: normal shape and size; Eyes: sclera white, no icterus, conjunctiva pink, PERRLA and EOMs intact;  Cardiovascular: Normal rate and rhythm. S1,S2 noted.  No murmur, rubs or gallops noted. No JVD or BLE edema. No carotid bruits noted. Pulmonary/Chest: Normal effort and positive vesicular breath sounds. No respiratory distress. No wheezes, rales or ronchi noted.  Abdomen: Soft and nontender. Normal bowel sounds.  Musculoskeletal: Pain with palpation over the lumbar spine.  Joint enlargement noted of bilateral knees.  Normal flexion, extension and rotation of the right wrist.  Normal flexion extension of the fingers.  No joint swelling noted.  Handgrips equal.  Gait slow and steady with use of cane. Neurological: Alert and oriented. Cranial nerves II-XII grossly intact. Coordination normal.  Psychiatric: Mood and affect normal. Behavior is normal. Judgment and thought content normal.    BMET    Component Value Date/Time   NA 139 09/20/2023 0933   NA 138 08/22/2014 1237   K 3.9 09/20/2023 0933   K 3.8 08/22/2014 1237   CL 100 09/20/2023 0933   CO2 27 09/20/2023 0933   CO2 28 08/22/2014 1237   GLUCOSE 103 (H) 09/20/2023 0933   GLUCOSE 102 08/22/2014 1237   BUN 16 09/20/2023 0933   BUN 16.0 08/22/2014 1237   CREATININE 0.94 09/20/2023 0933   CREATININE 1.1 08/22/2014 1237   CALCIUM  9.4 09/20/2023 0933   CALCIUM  9.8 08/22/2014 1237   GFRNONAA 48 (L) 01/18/2022 1620   GFRAA 72 12/01/2007 1105    Lipid Panel     Component Value Date/Time   CHOL 134 09/20/2023 0933   TRIG 132 09/20/2023 0933   HDL 47 (L) 09/20/2023 0933   CHOLHDL 2.9 09/20/2023 0933   VLDL 29.8 07/02/2020 1009   LDLCALC 65 09/20/2023 0933  CBC    Component Value Date/Time   WBC 5.9 09/20/2023 0933   RBC 4.69 09/20/2023 0933   HGB 13.7 09/20/2023 0933   HGB 13.9 08/22/2014 1237   HCT 40.8 09/20/2023 0933   HCT 42.7 08/22/2014 1237   PLT 397 09/20/2023 0933   PLT 345 08/22/2014 1237   MCV  87.0 09/20/2023 0933   MCV 88.7 08/22/2014 1237   MCH 29.2 09/20/2023 0933   MCHC 33.6 09/20/2023 0933   RDW 12.3 09/20/2023 0933   RDW 13.8 08/22/2014 1237   LYMPHSABS 2.9 08/22/2014 1237   MONOABS 0.8 08/22/2014 1237   EOSABS 0.4 08/22/2014 1237   BASOSABS 0.1 08/22/2014 1237    Hgb A1C Lab Results  Component Value Date   HGBA1C 5.9 (H) 09/20/2023           Assessment & Plan:     RTC in 6 months for follow-up of chronic conditions Helayne Lo, NP

## 2024-03-21 NOTE — Assessment & Plan Note (Signed)
Continue yearly mammograms 

## 2024-03-21 NOTE — Assessment & Plan Note (Signed)
Encourage weight loss as this can reduce sleep apnea symptoms Noncompliant with CPAP 

## 2024-03-22 ENCOUNTER — Ambulatory Visit: Payer: Self-pay | Admitting: Internal Medicine

## 2024-03-22 ENCOUNTER — Telehealth: Payer: Self-pay

## 2024-03-22 NOTE — Telephone Encounter (Signed)
 Copied from CRM [id number]. Topic: Referral - Prior Authorization Question >> Mar 22, 2024 11:06 AM Chrystal Crape R wrote: Pt states she needs a prior auth for her rx wegovy 0.25 mg

## 2024-03-23 ENCOUNTER — Telehealth: Payer: Self-pay

## 2024-03-23 ENCOUNTER — Other Ambulatory Visit (HOSPITAL_COMMUNITY): Payer: Self-pay

## 2024-03-23 LAB — LIPID PANEL
Cholesterol: 137 mg/dL (ref <200)
HDL: 50 mg/dL (ref >=50)
LDL Cholesterol (Calc): 69 mg/dL
Non-HDL Cholesterol (Calc): 87 mg/dL (ref <130)
Total CHOL/HDL Ratio: 2.7 (calc) (ref <5.0)
Triglycerides: 99 mg/dL (ref <150)

## 2024-03-23 LAB — CBC
HCT: 41.5 % (ref 35.0–45.0)
Hemoglobin: 13.7 g/dL (ref 11.7–15.5)
MCH: 29.3 pg (ref 27.0–33.0)
MCHC: 33 g/dL (ref 32.0–36.0)
MCV: 88.7 fL (ref 80.0–100.0)
MPV: 10.9 fL (ref 7.5–12.5)
Platelets: 356 10*3/uL (ref 140–400)
RBC: 4.68 10*6/uL (ref 3.80–5.10)
RDW: 12.9 % (ref 11.0–15.0)
WBC: 6.4 10*3/uL (ref 3.8–10.8)

## 2024-03-23 LAB — HEMOGLOBIN A1C
Hgb A1c MFr Bld: 5.8 % — ABNORMAL HIGH (ref <5.7)
Mean Plasma Glucose: 120 mg/dL
eAG (mmol/L): 6.6 mmol/L

## 2024-03-23 LAB — COMPREHENSIVE METABOLIC PANEL WITH GFR
AG Ratio: 1.5 (calc) (ref 1.0–2.5)
ALT: 18 U/L (ref 6–29)
AST: 16 U/L (ref 10–35)
Albumin: 4.4 g/dL (ref 3.6–5.1)
Alkaline phosphatase (APISO): 50 U/L (ref 37–153)
BUN/Creatinine Ratio: 20 (calc) (ref 6–22)
BUN: 21 mg/dL (ref 7–25)
CO2: 27 mmol/L (ref 20–32)
Calcium: 9.7 mg/dL (ref 8.6–10.4)
Chloride: 98 mmol/L (ref 98–110)
Creat: 1.06 mg/dL — ABNORMAL HIGH (ref 0.60–0.95)
Globulin: 2.9 g/dL (ref 1.9–3.7)
Glucose, Bld: 106 mg/dL — ABNORMAL HIGH (ref 65–99)
Potassium: 4.1 mmol/L (ref 3.5–5.3)
Sodium: 136 mmol/L (ref 135–146)
Total Bilirubin: 0.5 mg/dL (ref 0.2–1.2)
Total Protein: 7.3 g/dL (ref 6.1–8.1)
eGFR: 52 mL/min/{1.73_m2} — ABNORMAL LOW (ref >=60)

## 2024-03-23 LAB — ANTI-NUCLEAR AB-TITER (ANA TITER): ANA Titer 1: 1:40 {titer} — ABNORMAL HIGH

## 2024-03-23 LAB — ANA: Anti Nuclear Antibody (ANA): POSITIVE — AB

## 2024-03-23 LAB — C-REACTIVE PROTEIN: CRP: 5.7 mg/L (ref <8.0)

## 2024-03-23 LAB — SEDIMENTATION RATE: Sed Rate: 33 mm/h — ABNORMAL HIGH (ref 0–30)

## 2024-03-23 LAB — RHEUMATOID FACTOR: Rheumatoid fact SerPl-aCnc: <10 [IU]/mL (ref <14)

## 2024-03-23 NOTE — Telephone Encounter (Signed)
 PA request has been Submitted. New Encounter has been or will be created for follow up. For additional info see Pharmacy Prior Auth telephone encounter from 03/23/2024.

## 2024-03-23 NOTE — Telephone Encounter (Signed)
 Pharmacy Patient Advocate Encounter   Received notification from Pt Calls Messages that prior authorization for Wegovy 0.25MG /0.5ML auto-injectors is required/requested.   Insurance verification completed.   The patient is insured through [facility] .   Per test claim: PA required; PA submitted to above mentioned insurance via CoverMyMeds Key/confirmation #/EOC [id number] Status is pending

## 2024-03-24 ENCOUNTER — Other Ambulatory Visit (HOSPITAL_COMMUNITY): Payer: Self-pay

## 2024-03-24 NOTE — Telephone Encounter (Signed)
 Pharmacy Patient Advocate Encounter  Received notification from [facility] that Prior Authorization for Wegovy  0.25MG /0.5ML auto-injectors has been APPROVED from 03/23/24 to 09/19/24. Ran test claim, Copay is $25.00. This test claim was processed through [facility]- copay amounts may vary at other pharmacies due to pharmacy/plan contracts, or as the patient moves through the different stages of their insurance plan.   PA #/Case ID/Reference #: [id number]

## 2024-03-29 ENCOUNTER — Other Ambulatory Visit: Payer: Self-pay | Admitting: Internal Medicine

## 2024-03-29 DIAGNOSIS — R7303 Prediabetes: Secondary | ICD-10-CM

## 2024-03-29 DIAGNOSIS — I700 Atherosclerosis of aorta: Secondary | ICD-10-CM

## 2024-03-29 DIAGNOSIS — G4733 Obstructive sleep apnea (adult) (pediatric): Secondary | ICD-10-CM

## 2024-03-29 NOTE — Telephone Encounter (Signed)
 Copied from CRM [id number]. Topic: Clinical - Medication Refill >> Mar 29, 2024 12:38 PM Lin Rend J wrote: Medication: Semaglutide -Weight Management 0.25 MG/0.5ML SOAJ(Wegovy )  Has the patient contacted their pharmacy? Yes (Agent: If no, request that the patient contact the pharmacy for the refill. If patient does not wish to contact the pharmacy document the reason why and proceed with request.) (Agent: If yes, when and what did the pharmacy advise?)  This is the patient's preferred pharmacy:  [facility] - [place], [place] - [address] [place] [place] [address] Phone: [phone] Fax: [phone]    Is this the correct pharmacy for this prescription? Yes If no, delete pharmacy and type the correct one.   Has the prescription been filled recently? No  Is the patient out of the medication? NA  Has the patient been seen for an appointment in the last year OR does the patient have an upcoming appointment? Yes  Can we respond through MyChart? Yes  Agent: Please be advised that Rx refills may take up to 3 business days. We ask that you follow-up with your pharmacy.

## 2024-03-31 ENCOUNTER — Other Ambulatory Visit: Payer: Self-pay | Admitting: Internal Medicine

## 2024-03-31 DIAGNOSIS — I700 Atherosclerosis of aorta: Secondary | ICD-10-CM

## 2024-03-31 DIAGNOSIS — R7303 Prediabetes: Secondary | ICD-10-CM

## 2024-03-31 DIAGNOSIS — G4733 Obstructive sleep apnea (adult) (pediatric): Secondary | ICD-10-CM

## 2024-03-31 NOTE — Telephone Encounter (Signed)
 Requested medication (s) are due for refill today: na, requesting Rx to be sent to different pharmacy  Requested medication (s) are on the active medication list: yes   Last refill:  03/21/24 #2 ml 0 refills  Future visit scheduled: yes 09/21/24  Notes to clinic:  do you want to send Rx to another pharmacy? [facility] to verify Rx has not been processed yet. Unable to speak with representative to confirm.      Requested Prescriptions  Pending Prescriptions Disp Refills   Semaglutide -Weight Management 0.25 MG/0.5ML SOAJ 2 mL 0    Sig: Inject 0.25 mg into the skin once a week.     Endocrinology:  Diabetes - GLP-1 Receptor Agonists - semaglutide  Failed - 03/31/2024  1:32 PM      Failed - HBA1C in normal range and within 180 days    Hgb A1c MFr Bld  Date Value Ref Range Status  03/21/2024 5.8 (H) <5.7 % Final    Comment:    For someone without known diabetes, a hemoglobin  A1c value between 5.7% and 6.4% is consistent with prediabetes and should be confirmed with a  follow-up test. . For someone with known diabetes, a value <7% indicates that their diabetes is well controlled. A1c targets should be individualized based on duration of diabetes, age, comorbid conditions, and other considerations. . This assay result is consistent with an increased risk of diabetes. . Currently, no consensus exists regarding use of hemoglobin A1c for diagnosis of diabetes for children. .          Failed - Cr in normal range and within 360 days    Creatinine  Date Value Ref Range Status  08/22/2014 1.1 0.6 - 1.1 mg/dL Final   Creat  Date Value Ref Range Status  03/21/2024 1.06 (H) 0.60 - 0.95 mg/dL Final         Passed - Valid encounter within last 6 months    Recent Outpatient Visits           1 week ago Chronic midline low back pain with right-sided sciatica   [place] [facility] [place], [place], [place]

## 2024-03-31 NOTE — Telephone Encounter (Signed)
 [facility] [phone] to confirm if medication has been delivered for wegovy . Unable to speak with representative . Patient requesting Rx to be sent to a different pharmacy .

## 2024-03-31 NOTE — Telephone Encounter (Unsigned)
 Copied from CRM [id number]. Topic: Clinical - Medication Refill >> Mar 31, 2024  2:15 PM Lin Rend J wrote: Medication: Semaglutide -Weight Management 0.25 MG/0.5ML SOAJ (Wegovy )  Has the patient contacted their pharmacy? Yes (Agent: If no, request that the patient contact the pharmacy for the refill. If patient does not wish to contact the pharmacy document the reason why and proceed with request.) (Agent: If yes, when and what did the pharmacy advise?)  This is the patient's preferred pharmacy:  [facility] - [place], [place] - [address] [address] Phone: [phone] Fax: [phone]   Is this the correct pharmacy for this prescription? Yes If no, delete pharmacy and type the correct one.   Has the prescription been filled recently? No  Is the patient out of the medication? Yes  Has the patient been seen for an appointment in the last year OR does the patient have an upcoming appointment? Yes  Can we respond through MyChart? Yes  Agent: Please be advised that Rx refills may take up to 3 business days. We ask that you follow-up with your pharmacy.

## 2024-04-03 ENCOUNTER — Other Ambulatory Visit: Payer: Self-pay

## 2024-04-03 DIAGNOSIS — R7303 Prediabetes: Secondary | ICD-10-CM

## 2024-04-03 DIAGNOSIS — G4733 Obstructive sleep apnea (adult) (pediatric): Secondary | ICD-10-CM

## 2024-04-03 DIAGNOSIS — I700 Atherosclerosis of aorta: Secondary | ICD-10-CM

## 2024-04-03 MED ORDER — SEMAGLUTIDE-WEIGHT MANAGEMENT 0.25 MG/0.5ML ~~LOC~~ SOAJ: 0.2500 mg | SUBCUTANEOUS | 0 refills | Status: DC

## 2024-04-04 NOTE — Telephone Encounter (Signed)
 Requested Prescriptions  Refused Prescriptions Disp Refills   Semaglutide -Weight Management 0.25 MG/0.5ML SOAJ 2 mL 0    Sig: Inject 0.25 mg into the skin once a week.     Endocrinology:  Diabetes - GLP-1 Receptor Agonists - semaglutide  Failed - 04/04/2024 10:22 AM      Failed - HBA1C in normal range and within 180 days    Hgb A1c MFr Bld  Date Value Ref Range Status  03/21/2024 5.8 (H) <5.7 % Final    Comment:    For someone without known diabetes, a hemoglobin  A1c value between 5.7% and 6.4% is consistent with prediabetes and should be confirmed with a  follow-up test. . For someone with known diabetes, a value <7% indicates that their diabetes is well controlled. A1c targets should be individualized based on duration of diabetes, age, comorbid conditions, and other considerations. . This assay result is consistent with an increased risk of diabetes. . Currently, no consensus exists regarding use of hemoglobin A1c for diagnosis of diabetes for children. .          Failed - Cr in normal range and within 360 days    Creatinine  Date Value Ref Range Status  08/22/2014 1.1 0.6 - 1.1 mg/dL Final   Creat  Date Value Ref Range Status  03/21/2024 1.06 (H) 0.60 - 0.95 mg/dL Final         Passed - Valid encounter within last 6 months    Recent Outpatient Visits           2 weeks ago Chronic midline low back pain with right-sided sciatica   [place] [place], [place], [place]

## 2024-04-25 ENCOUNTER — Other Ambulatory Visit: Payer: Self-pay | Admitting: Internal Medicine

## 2024-04-25 DIAGNOSIS — G4733 Obstructive sleep apnea (adult) (pediatric): Secondary | ICD-10-CM

## 2024-04-25 DIAGNOSIS — I700 Atherosclerosis of aorta: Secondary | ICD-10-CM

## 2024-04-25 DIAGNOSIS — R7303 Prediabetes: Secondary | ICD-10-CM

## 2024-04-26 NOTE — Telephone Encounter (Signed)
 Requested Prescriptions  Pending Prescriptions Disp Refills   Semaglutide -Weight Management (WEGOVY ) 0.25 MG/0.5ML SOAJ [Pharmacy Med Name: WEGOVY  0.25 MG/0.5 ML PEN] 2 mL 1    Sig: INJECT 0.25MG  INTO THE SKIN ONCE A WEEK     Endocrinology:  Diabetes - GLP-1 Receptor Agonists - semaglutide  Failed - 04/26/2024  4:16 PM      Failed - HBA1C in normal range and within 180 days    Hgb A1c MFr Bld  Date Value Ref Range Status  03/21/2024 5.8 (H) <5.7 % Final    Comment:    For someone without known diabetes, a hemoglobin  A1c value between 5.7% and 6.4% is consistent with prediabetes and should be confirmed with a  follow-up test. . For someone with known diabetes, a value <7% indicates that their diabetes is well controlled. A1c targets should be individualized based on duration of diabetes, age, comorbid conditions, and other considerations. . This assay result is consistent with an increased risk of diabetes. . Currently, no consensus exists regarding use of hemoglobin A1c for diagnosis of diabetes for children. .          Failed - Cr in normal range and within 360 days    Creatinine  Date Value Ref Range Status  08/22/2014 1.1 0.6 - 1.1 mg/dL Final   Creat  Date Value Ref Range Status  03/21/2024 1.06 (H) 0.60 - 0.95 mg/dL Final         Passed - Valid encounter within last 6 months    Recent Outpatient Visits           1 month ago Chronic midline low back pain with right-sided sciatica   [place] [place], [place], [place]

## 2024-06-19 NOTE — Progress Notes (Signed)
 Update: BRCA2 [id number] VUS reclassified to benign. Amended report received on 05/01/2024

## 2024-06-20 ENCOUNTER — Other Ambulatory Visit: Payer: Self-pay | Admitting: Internal Medicine

## 2024-06-20 DIAGNOSIS — R7303 Prediabetes: Secondary | ICD-10-CM

## 2024-06-20 DIAGNOSIS — G4733 Obstructive sleep apnea (adult) (pediatric): Secondary | ICD-10-CM

## 2024-06-20 DIAGNOSIS — I700 Atherosclerosis of aorta: Secondary | ICD-10-CM

## 2024-06-21 NOTE — Telephone Encounter (Signed)
 Labs in date.  Requested Prescriptions  Pending Prescriptions Disp Refills   WEGOVY  0.25 MG/0.5ML SOAJ SQ injection [Pharmacy Med Name: WEGOVY  0.25 MG/0.5 ML PEN]  1    Sig: INJECT 0.25MG  INTO THE SKIN ONCE A WEEK     Endocrinology:  Diabetes - GLP-1 Receptor Agonists - semaglutide  Failed - 06/21/2024 12:07 PM      Failed - HBA1C in normal range and within 180 days    Hgb A1c MFr Bld  Date Value Ref Range Status  03/21/2024 5.8 (H) <5.7 % Final    Comment:    For someone without known diabetes, a hemoglobin  A1c value between 5.7% and 6.4% is consistent with prediabetes and should be confirmed with a  follow-up test. . For someone with known diabetes, a value <7% indicates that their diabetes is well controlled. A1c targets should be individualized based on duration of diabetes, age, comorbid conditions, and other considerations. . This assay result is consistent with an increased risk of diabetes. . Currently, no consensus exists regarding use of hemoglobin A1c for diagnosis of diabetes for children. .          Failed - Cr in normal range and within 360 days    Creatinine  Date Value Ref Range Status  08/22/2014 1.1 0.6 - 1.1 mg/dL Final   Creat  Date Value Ref Range Status  03/21/2024 1.06 (H) 0.60 - 0.95 mg/dL Final         Passed - Valid encounter within last 6 months    Recent Outpatient Visits           3 months ago Chronic midline low back pain with right-sided sciatica   [facility] [place], Angeline ORN, [place]

## 2024-08-08 ENCOUNTER — Telehealth: Payer: Self-pay | Admitting: Orthopaedic Surgery

## 2024-08-08 NOTE — Telephone Encounter (Signed)
Patient called. She would like gel injections in her knee.

## 2024-08-14 ENCOUNTER — Encounter: Payer: Self-pay | Admitting: Radiology

## 2024-08-15 ENCOUNTER — Telehealth: Payer: Self-pay | Admitting: Orthopaedic Surgery

## 2024-08-15 ENCOUNTER — Other Ambulatory Visit: Payer: Self-pay

## 2024-08-15 ENCOUNTER — Telehealth: Payer: Self-pay

## 2024-08-15 ENCOUNTER — Other Ambulatory Visit: Payer: Self-pay | Admitting: Internal Medicine

## 2024-08-15 DIAGNOSIS — G4733 Obstructive sleep apnea (adult) (pediatric): Secondary | ICD-10-CM

## 2024-08-15 DIAGNOSIS — R7303 Prediabetes: Secondary | ICD-10-CM

## 2024-08-15 DIAGNOSIS — M1711 Unilateral primary osteoarthritis, right knee: Secondary | ICD-10-CM

## 2024-08-15 DIAGNOSIS — I700 Atherosclerosis of aorta: Secondary | ICD-10-CM

## 2024-08-15 MED ORDER — WEGOVY 0.25 MG/0.5ML ~~LOC~~ SOAJ: 0.2500 mg | SUBCUTANEOUS | 1 refills | Status: DC

## 2024-08-15 NOTE — Telephone Encounter (Signed)
 Patient called and wanted to know if she got approved for the Gel shot or not. CB#[phone]

## 2024-08-15 NOTE — Telephone Encounter (Signed)
 We have an appointment next month.  I would prefer if we could wait and discuss this at that time, because I would like to see her lab work first before making a decision.

## 2024-08-15 NOTE — Telephone Encounter (Signed)
Talked with patient and appt.has been scheduled for gel injection.  

## 2024-08-15 NOTE — Addendum Note (Signed)
 Addended by: ANTONETTE ANGELINE ORN on: 08/15/2024 10:12 AM   Modules accepted: Orders

## 2024-08-15 NOTE — Telephone Encounter (Signed)
 VOB submitted.

## 2024-08-15 NOTE — Telephone Encounter (Signed)
 Copied from CRM #[id number]. Topic: Clinical - Medication Question >> Aug 15, 2024  9:20 AM Kendralyn S wrote: Reason for CRM: patient wanting to know if the dose of wegovy  can be bumped up

## 2024-08-15 NOTE — Telephone Encounter (Signed)
 Refilled

## 2024-08-24 ENCOUNTER — Ambulatory Visit: Attending: Internal Medicine | Admitting: Internal Medicine

## 2024-08-24 ENCOUNTER — Ambulatory Visit

## 2024-08-24 ENCOUNTER — Encounter: Payer: Self-pay | Admitting: Internal Medicine

## 2024-08-24 VITALS — BP 155/82 | HR 80 | Temp 97.3°F | Resp 16 | Ht 66.5 in | Wt 227.2 lb

## 2024-08-24 DIAGNOSIS — G8929 Other chronic pain: Secondary | ICD-10-CM

## 2024-08-24 DIAGNOSIS — R7689 Other specified abnormal immunological findings in serum: Secondary | ICD-10-CM | POA: Diagnosis not present

## 2024-08-24 DIAGNOSIS — M1711 Unilateral primary osteoarthritis, right knee: Secondary | ICD-10-CM | POA: Diagnosis not present

## 2024-08-24 DIAGNOSIS — N1831 Chronic kidney disease, stage 3a: Secondary | ICD-10-CM | POA: Insufficient documentation

## 2024-08-24 DIAGNOSIS — M5441 Lumbago with sciatica, right side: Secondary | ICD-10-CM

## 2024-08-24 NOTE — Progress Notes (Signed)
 Office Visit Note  Patient: Miranda Mueller             Date of Birth: July 20, 1941           MRN: [id number]             PCP: Antonette Angeline ORN, NP Referring: Antonette Angeline ORN, NP Visit Date: 08/24/2024 Occupation: Data Unavailable  Subjective:  New Patient (Initial Visit) (Was previously told by Dr Randeen that she had arthritis in her back, pain has gotten worse over time. )    Discussed the use of AI scribe software for clinical note transcription with the patient, who gave verbal consent to proceed.  History of Present Illness   Miranda Mueller is an 83 year old female referred by her primary care doctor for evaluation of arthritis and abnormal blood test results with positive ANA and elevated sed rate.  She has experienced back pain for several years, which has intensified since the beginning of spring this year. The pain is located very low, towards the end of her spine and is present daily. It worsens with walking and standing, and she describes it as 'muscle spasms or something grabbing.' Lying down seems to relax the pain, allowing her to sleep through the night. No weakness, instability, or balance issues. She mentions that her back pain may have been exacerbated by exercises aimed at improving her balance. She has not had recent imaging of her spine, but a CT scan from 2023 incidentally included her spine.  She has a history of osteoarthritis and has received gel injections in her knee with Dr. Vernetta, which she finds helpful. Her next appointment for a knee injection is scheduled for the Wednesday before Thanksgiving. She has not had any procedures for her back but did receive a steroid injection in her hip a couple of years ago, which was beneficial. She experiences occasional swelling in her hands, particularly around the 2nd-3rd knuckles of her right hand, and has a family history of arthritis.  Her blood tests showed a positive ANA at a low level and a slightly elevated  sedimentation rate. She has slightly reduced kidney function. Her blood pressure was elevated today, but it is usually well-controlled. She avoids taking Advil due to her kidney function and uses Tylenol  instead. She occasionally uses muscle relaxers in the morning as needed.  She is a former data processing manager for [employer], involving extensive computer work. No neck pain radiating to arms, swelling in feet and ankles, or significant fluid retention. Reports occasional hand swelling and stiffness.      Labs reviewed 03/2024 ANA 1:40 ESR 33 RF neg CRP wnl   Activities of Daily Living:  Patient reports morning stiffness for 1 hour.   Patient Denies nocturnal pain.  Difficulty dressing/grooming: Denies Difficulty climbing stairs: Reports Difficulty getting out of chair: Denies Difficulty using hands for taps, buttons, cutlery, and/or writing: Denies  Review of Systems  Constitutional:  Negative for fatigue.  HENT:  Positive for mouth dryness. Negative for mouth sores.   Eyes:  Positive for dryness.  Respiratory:  Negative for shortness of breath.   Cardiovascular:  Negative for chest pain and palpitations.  Gastrointestinal:  Negative for blood in stool, constipation and diarrhea.  Endocrine: Negative for increased urination.  Genitourinary:  Negative for involuntary urination.  Musculoskeletal:  Positive for joint pain, joint pain, myalgias, morning stiffness, muscle tenderness and myalgias. Negative for gait problem, joint swelling and muscle weakness.  Skin:  Negative  for color change, rash, hair loss and sensitivity to sunlight.  Allergic/Immunologic: Negative for susceptible to infections.  Neurological:  Negative for dizziness and headaches.  Hematological:  Negative for swollen glands.  Psychiatric/Behavioral:  Negative for depressed mood and sleep disturbance. The patient is not nervous/anxious.     PMFS History:  Patient Active Problem List   Diagnosis Date  Noted   Positive ANA (antinuclear antibody) 08/24/2024   Chronic pain of both knees 03/21/2024   Chronic pain of right hand 03/21/2024   Chronic pain of right wrist 03/21/2024   Unilateral primary osteoarthritis, right knee 02/10/2023   Aortic atherosclerosis 02/23/2022   CKD (chronic kidney disease) stage 3, GFR 30-59 ml/min (HCC) 07/03/2021   Class 3 severe obesity due to excess calories with body mass index (BMI) of 40.0 to 44.9 in adult (HCC) 07/03/2021   Prediabetes 07/02/2020   Chronic midline low back pain with right-sided sciatica 07/02/2020   Eczema 07/02/2020   OSA (obstructive sleep apnea) 08/27/2015   History of breast cancer 08/20/2014   HLD (hyperlipidemia) 11/23/2007   Essential hypertension 11/23/2007   GERD 11/23/2007    Past Medical History:  Diagnosis Date   Bradycardia    Breast cancer (HCC) 08/29/14   Right Breast -High Grade Invasive Mammary   Cataract    surgery   GERD (gastroesophageal reflux disease)    History of shingles    Hx of radiation therapy 10/01/14- 11/20/14   right breast/50.4 Gy/28 fx; right breast boost/10 Gy/5 fx   Hyperlipidemia    Hypertension    IBS (irritable bowel syndrome)    Multifocal PVCs    PVC (premature ventricular contraction)    Hx of   Sleep apnea    no cpap per pt     Family History  Problem Relation Age of Onset   Hypertension Mother    Heart disease Mother        CAD   Breast cancer Mother 71   Arthritis Mother    Stroke Father    Hypertension Sister    Breast cancer Sister 27   Aneurysm Sister    Arthritis Brother    Heart attack Brother        COD   Hypertension Brother    Prostate cancer Brother 75   Heart disease Maternal Grandmother    Hypertension Daughter    Colon polyps Daughter    Leukemia Maternal Uncle 60   Leukemia Maternal Uncle 36   Breast cancer Cousin        dx under 50   Colon cancer Cousin        dx in her 19s   Colon cancer Cousin    Lymphoma Other 49       NHL   Esophageal  cancer Neg Hx    Stomach cancer Neg Hx    Rectal cancer Neg Hx    Past Surgical History:  Procedure Laterality Date   BREAST LUMPECTOMY Right 08/29/14   started radiation 10/01/14   CATARACT EXTRACTION  2009   bilateral   CHOLECYSTECTOMY N/A 11/28/2021   Procedure: LAPAROSCOPIC CHOLECYSTECTOMY WITH INTRAOPERATIVE CHOLANGIOGRAM, LIVER BIOPSY;  Surgeon: Sheldon Standing, MD;  Location: [facility];  Service: General;  Laterality: N/A;   COLONOSCOPY  2004-last 2016   normal   KNEE ARTHROSCOPY     right   NEEDLE CORE BIOPSY  RIGHT BREAST Right 08/14/14   OTHER SURGICAL HISTORY Left 1997   breast,cyst   PYLOROPLASTY  2016   RADIOACTIVE SEED GUIDED PARTIAL MASTECTOMY  WITH AXILLARY SENTINEL LYMPH NODE BIOPSY Right 08/29/2014   Procedure: SEED LOCALIZED RIGHT BREAST LUMPECTOMY AND RIGHT AXILLARY SENTTINEL LYMPH NODE BIOPSY;  Surgeon: Morene Olives, MD;  Location: [facility];  Service: General;  Laterality: Right;   WISDOM TOOTH EXTRACTION     Social History   Tobacco Use   Smoking status: Never    Passive exposure: Past   Smokeless tobacco: Never  Vaping Use   Vaping status: Never Used  Substance Use Topics   Alcohol use: No    Alcohol/week: 0.0 standard drinks of alcohol   Drug use: No   Social History   Social History Narrative   Consumes one glass of caffeine daily     Immunization History  Administered Date(s) Administered   Fluad Quad(high Dose 65+) 07/17/2019, 07/02/2020, 07/03/2021, 07/08/2022   INFLUENZA, HIGH DOSE SEASONAL PF 09/19/2018   Influenza Split 07/13/2011, 07/25/2012   Influenza Whole 08/16/2002, 07/13/2007   Influenza,inj,Quad PF,6+ Mos 09/01/2013, 08/27/2015, 08/04/2016, 08/02/2017   Influenza-Unspecified 07/29/2023, 08/10/2024   Moderna SARS-COV2 Booster Vaccination 11/29/2020, 09/12/2021   Moderna Sars-Covid-2 Vaccination 12/09/2019, 01/06/2020   PNEUMOCOCCAL CONJUGATE-20 09/20/2023   Pneumococcal Conjugate-13 08/27/2015   Pneumococcal  Polysaccharide-23 02/17/2010   RSV,unspecified 08/20/2023   Td 08/16/2002, 04/19/2012   Zoster Recombinant(Shingrix) 03/04/2018, 05/23/2018   Zoster, Live 12/01/2007     Objective: Vital Signs: BP (!) 155/82   Pulse 80   Temp (!) 97.3 F (36.3 C)   Resp 16   Ht 5' 6.5 (1.689 m)   Wt 227 lb 3.2 oz (103.1 kg)   BMI 36.12 kg/m    Physical Exam Constitutional:      Appearance: She is obese.  Eyes:     Conjunctiva/sclera: Conjunctivae normal.  Cardiovascular:     Rate and Rhythm: Normal rate and regular rhythm.  Pulmonary:     Effort: Pulmonary effort is normal.     Breath sounds: Normal breath sounds.  Musculoskeletal:     Right lower leg: No edema.     Left lower leg: No edema.  Skin:    General: Skin is warm and dry.     Findings: No rash.  Neurological:     Mental Status: She is alert.  Psychiatric:        Mood and Affect: Mood normal.      Musculoskeletal Exam:  Upper trapezius and neck muscles very stiff Elbows full ROM no tenderness or swelling Wrists full ROM no tenderness or swelling Fingers full ROM no tenderness or swelling Low back tenderness to pressure right of midline near iliac crest provokes symptoms in right foot Knees full ROM no tenderness or swelling    Investigation: No additional findings.  Imaging: XR Pelvis 1-2 Views Result Date: 08/24/2024 X-ray pelvis AP SI joints patent bilaterally with mild degenerative change.  No associated sclerosis.  Hip joint space is preserved.  Enthesophyte process visible on lateral border of iliac crest on left and at the greater trochanter on left.  Mild degenerative changes, findings consistent with probably chronic gluteal tendinopathy at the left lateral hip.  XR Lumbar Spine 2-3 Views Result Date: 08/24/2024 X-ray lumbar spine 2 views AP and lateral There is mild acquired appearing scoliosis of the lumbar spine with multilevel degenerative change.  Endplate osteophytes present throughout L1-L5.  There  is no significant appearing anterior or retrolisthesis.  There is posterior bone spurring that could impinge on foramen space at L4-L5 and L5-S1.  Severe loss of disc height at L5-S1.  The degenerative change at  the bottom of lumbar spine are likely consistent with her localized areas of back pain on exam and associated radicular symptoms.   Recent Labs: Lab Results  Component Value Date   WBC 6.4 03/21/2024   HGB 13.7 03/21/2024   PLT 356 03/21/2024   NA 136 03/21/2024   K 4.1 03/21/2024   CL 98 03/21/2024   CO2 27 03/21/2024   GLUCOSE 106 (H) 03/21/2024   BUN 21 03/21/2024   CREATININE 1.06 (H) 03/21/2024   BILITOT 0.5 03/21/2024   ALKPHOS 78 11/29/2021   AST 16 03/21/2024   ALT 18 03/21/2024   PROT 7.3 03/21/2024   ALBUMIN 3.3 (L) 11/29/2021   CALCIUM  9.7 03/21/2024   GFRAA 72 12/01/2007    Speciality Comments: No specialty comments available.  Procedures:  No procedures performed Allergies: Patient has no known allergies.   Assessment / Plan:     Visit Diagnoses: Positive ANA (antinuclear antibody) - Plan: Sjogrens syndrome-A extractable nuclear antibody, Anti-DNA antibody, double-stranded, Anti-Smith antibody, RNP Antibody, Sedimentation rate Positive ANA and chronic joint pain at multiple areas but exam is pretty benign with no peripheral synovitis skin inflammation or other specific clinical criteria.  Review of imaging does indicate degenerative arthritis in multiple areas likely to account for her reported symptoms.  Will check extractable nuclear antibodies but low pretest disease suspicion. - Ordered blood tests for ENAs and inflammatory markers.  Chronic midline low back pain with right-sided sciatica - Plan: XR Lumbar Spine 2-3 Views, XR Pelvis 1-2 Views Degenerative disc disease of lumbar spine with radiculopathy Chronic lumbar radiculopathy with pain, spasms, and tingling, likely due to nerve impingement at L5-S1. Imaging shows degenerative changes. Differential  includes nerve root compression.  She reports completing the course of physical therapy for her low back pain between 1 to 2 years ago without great symptom relief. - Ordered lumbar spine and pelvis x-rays. - Consider physical therapy. - Discussed potential use of gabapentin  or Lyrica. - Consider referral for interventions like injections or nerve ablations if symptoms persist.  Osteoarthritis of right knee Chronic osteoarthritis with pain exacerbated by activity. Previous steroid injections provided relief. - Proceed with scheduled gel injection for right knee.  Chronic kidney disease, unspecified stage Chronic kidney disease with reduced function, likely hypertension-related. Blood pressure elevated but usually controlled. Advised against NSAIDs. - Monitor kidney function and blood pressure. - Avoid NSAIDs; use Tylenol  for pain management.  -Could consider trial of topical Voltaren for hand and lateral hip pain complaints due to low systemic absorption       Orders: Orders Placed This Encounter  Procedures   XR Lumbar Spine 2-3 Views   XR Pelvis 1-2 Views   Sjogrens syndrome-A extractable nuclear antibody   Anti-DNA antibody, double-stranded   Anti-Smith antibody   RNP Antibody   Sedimentation rate   No orders of the defined types were placed in this encounter.   Follow-Up Instructions: Return in about 2 months (around 10/24/2024) for New pt ANA/low back f/u 2mos.   Lonni LELON Ester, MD  Note - This record has been created using [employer].  Chart creation errors have been sought, but may not always  have been located. Such creation errors do not reflect on  the standard of medical care.

## 2024-08-25 ENCOUNTER — Emergency Department
Admission: EM | Admit: 2024-08-25 | Discharge: 2024-08-25 | Disposition: A | Discharge status: Home / Self Care | Attending: Emergency Medicine | Admitting: Emergency Medicine

## 2024-08-25 ENCOUNTER — Other Ambulatory Visit: Payer: Self-pay

## 2024-08-25 DIAGNOSIS — I959 Hypotension, unspecified: Secondary | ICD-10-CM | POA: Diagnosis not present

## 2024-08-25 DIAGNOSIS — E876 Hypokalemia: Secondary | ICD-10-CM | POA: Insufficient documentation

## 2024-08-25 DIAGNOSIS — R55 Syncope and collapse: Secondary | ICD-10-CM | POA: Insufficient documentation

## 2024-08-25 DIAGNOSIS — I10 Essential (primary) hypertension: Secondary | ICD-10-CM | POA: Insufficient documentation

## 2024-08-25 DIAGNOSIS — I499 Cardiac arrhythmia, unspecified: Secondary | ICD-10-CM | POA: Diagnosis not present

## 2024-08-25 LAB — COMPREHENSIVE METABOLIC PANEL WITH GFR
ALT: 31 U/L (ref 0–44)
AST: 25 U/L (ref 15–41)
Albumin: 3.8 g/dL (ref 3.5–5.0)
Alkaline Phosphatase: 49 U/L (ref 38–126)
Anion gap: 12 (ref 5–15)
BUN: 18 mg/dL (ref 8–23)
CO2: 23 mmol/L (ref 22–32)
Calcium: 8.3 mg/dL — ABNORMAL LOW (ref 8.9–10.3)
Chloride: 102 mmol/L (ref 98–111)
Creatinine, Ser: 1.13 mg/dL — ABNORMAL HIGH (ref 0.44–1.00)
GFR, Estimated: 48 mL/min — ABNORMAL LOW (ref >60)
Glucose, Bld: 125 mg/dL — ABNORMAL HIGH (ref 70–99)
Potassium: 2.9 mmol/L — ABNORMAL LOW (ref 3.5–5.1)
Sodium: 137 mmol/L (ref 135–145)
Total Bilirubin: 0.3 mg/dL (ref 0.0–1.2)
Total Protein: 6.6 g/dL (ref 6.5–8.1)

## 2024-08-25 LAB — SEDIMENTATION RATE: Sed Rate: 33 mm/h — ABNORMAL HIGH (ref 0–30)

## 2024-08-25 LAB — CBC
HCT: 37.6 % (ref 36.0–46.0)
Hemoglobin: 12.5 g/dL (ref 12.0–15.0)
MCH: 29.8 pg (ref 26.0–34.0)
MCHC: 33.2 g/dL (ref 30.0–36.0)
MCV: 89.5 fL (ref 80.0–100.0)
Platelets: 374 K/uL (ref 150–400)
RBC: 4.2 MIL/uL (ref 3.87–5.11)
RDW: 13.2 % (ref 11.5–15.5)
WBC: 10.8 K/uL — ABNORMAL HIGH (ref 4.0–10.5)
nRBC: 0 % (ref 0.0–0.2)

## 2024-08-25 LAB — SJOGRENS SYNDROME-A EXTRACTABLE NUCLEAR ANTIBODY: SSA (Ro) (ENA) Antibody, IgG: <1 AI

## 2024-08-25 LAB — RNP ANTIBODY: Ribonucleic Protein(ENA) Antibody, IgG: <1 AI

## 2024-08-25 LAB — ANTI-SMITH ANTIBODY: ENA SM Ab Ser-aCnc: <1 AI

## 2024-08-25 LAB — TROPONIN T, HIGH SENSITIVITY: Troponin T High Sensitivity: 21 ng/L — ABNORMAL HIGH (ref 0–19)

## 2024-08-25 LAB — ANTI-DNA ANTIBODY, DOUBLE-STRANDED: ds DNA Ab: <1 [IU]/mL

## 2024-08-25 MED ORDER — POTASSIUM CHLORIDE CRYS ER 20 MEQ PO TBCR: 20.0000 meq | EXTENDED_RELEASE_TABLET | Freq: Two times a day (BID) | ORAL | 0 refills | Status: DC

## 2024-08-25 MED ORDER — SODIUM CHLORIDE 0.9 % IV SOLN: Freq: Once | INTRAVENOUS | Status: AC

## 2024-08-25 MED ORDER — POTASSIUM CHLORIDE CRYS ER 20 MEQ PO TBCR
40.0000 meq | EXTENDED_RELEASE_TABLET | Freq: Once | ORAL | Status: AC
  Administered 2024-08-25: 40 meq via ORAL
  Filled 2024-08-25: qty 2

## 2024-08-25 NOTE — ED Triage Notes (Signed)
 See first nurse note.

## 2024-08-25 NOTE — ED Provider Notes (Signed)
 [facility] Provider Note    Event Date/Time   First MD Initiated Contact with Patient 08/25/24 1511     (approximate)   History   Near Syncope  HPI  Miranda Mueller is a 83 y.o. female with history of hypertension, PVCs who presents after a near syncopal episode.  No fall or injury.  Felt lightheaded after lunch approximately 1 hour afterwards.  No nausea vomiting diarrhea.  No headache, no palpitations.  No chest pain.  No neurodeficits.  Feels well when she is lying down.  She reports similar symptoms about 2 years ago, review of record demonstrates she was seen at outside facility on January 18, 2022 at that time found to be mild with a hypokalemic     Physical Exam   Triage Vital Signs: ED Triage Vitals  Encounter Vitals Group     BP 08/25/24 1439 (!) 117/42     Girls Systolic BP Percentile --      Girls Diastolic BP Percentile --      Boys Systolic BP Percentile --      Boys Diastolic BP Percentile --      Pulse Rate 08/25/24 1439 82     Resp 08/25/24 1439 18     Temp 08/25/24 1439 97.8 F (36.6 C)     Temp Source 08/25/24 1439 Oral     SpO2 08/25/24 1439 96 %     Weight --      Height --      Head Circumference --      Peak Flow --      Pain Score 08/25/24 1503 0     Pain Loc --      Pain Education --      Exclude from Growth Chart --     Most recent vital signs: Vitals:   08/25/24 1500 08/25/24 1600  BP: (!) 89/41 (!) 116/51  Pulse: 66 66  Resp: 16 16  Temp:    SpO2: 92% 100%     General: Awake, no distress.  CV:  Good peripheral perfusion.  Regular rate and rhythm Resp:  Normal effort.  Abd:  No distention.  Soft, nontender Other:  No calf pain or swelling   ED Results / Procedures / Treatments   Labs (all labs ordered are listed, but only abnormal results are displayed) Labs Reviewed  COMPREHENSIVE METABOLIC PANEL WITH GFR - Abnormal; Notable for the following components:      Result Value   Potassium 2.9 (*)     Glucose, Bld 125 (*)    Creatinine, Ser 1.13 (*)    Calcium  8.3 (*)    GFR, Estimated 48 (*)    All other components within normal limits  CBC - Abnormal; Notable for the following components:   WBC 10.8 (*)    All other components within normal limits  TROPONIN T, HIGH SENSITIVITY - Abnormal; Notable for the following components:   Troponin T High Sensitivity 21 (*)    All other components within normal limits  URINALYSIS, ROUTINE W REFLEX MICROSCOPIC  CBG MONITORING, ED     EKG  ED ECG REPORT I, Lamar Price, the attending physician, personally viewed and interpreted this ECG.   Rhythm: normal sinus rhythm QRS Axis: normal Intervals: normal ST/T Wave abnormalities: normal Narrative Interpretation: Occasional PVC    RADIOLOGY     PROCEDURES:  Critical Care performed: yes  CRITICAL CARE Performed by: Lamar Price   Total critical care time: 30 minutes  Critical  care time was exclusive of separately billable procedures and treating other patients.  Critical care was necessary to treat or prevent imminent or life-threatening deterioration.  Critical care was time spent personally by me on the following activities: development of treatment plan with patient and/or surrogate as well as nursing, discussions with consultants, evaluation of patient's response to treatment, examination of patient, obtaining history from patient or surrogate, ordering and performing treatments and interventions, ordering and review of laboratory studies, ordering and review of radiographic studies, pulse oximetry and re-evaluation of patient's condition.   Procedures   MEDICATIONS ORDERED IN ED: Medications  0.9 %  sodium chloride  infusion (0 mLs Intravenous Stopped 08/25/24 1735)  potassium chloride  SA (KLOR-CON  M) CR tablet 40 mEq (40 mEq Oral Given 08/25/24 1539)     IMPRESSION / MDM / ASSESSMENT AND PLAN / ED COURSE  I reviewed the triage vital signs and the nursing  notes. Patient's presentation is most consistent with acute presentation with potential threat to life or bodily function.  Patient presents after near syncopal episode, found to be hypotensive on arrival.  She denies chest pain or palpitations.  Doubt ACS.  Differential includes dehydration, electrolyte abnormality, she denies any medication adjustments or changes, vasovagal episode  IV fluids infusing, on the cardiac monitor to evaluate for arrhythmia  Lab work demonstrates hypokalemia similar to prior outside hospital visit for similar condition.  Will give p.o. K-Dur, IV fluids infusing, will continue to monitor  Patient feels quite well after treatment, orthostatics are unremarkable, she is anxious for discharge, no indication for admission at this time, strict return precautions, she agrees with this plan      FINAL CLINICAL IMPRESSION(S) / ED DIAGNOSES   Final diagnoses:  Near syncope  Hypokalemia     Rx / DC Orders   ED Discharge Orders          Ordered    potassium chloride  SA (KLOR-CON  M) 20 MEQ tablet  2 times daily        08/25/24 1721             Note:  This document was prepared using Dragon voice recognition software and may include unintentional dictation errors.   Arlander Charleston, MD 08/25/24 [phone]

## 2024-08-25 NOTE — ED Notes (Signed)
 While completing triage, patient reports feeling like she was going to pass out. Vital signs obtained and patient's BP did drop. Patient roomed to ED 12.

## 2024-08-25 NOTE — ED Triage Notes (Signed)
 Pt comes via [facility] from Miranda [facility] with c/o near syncopal. Pt did not loc. Pt didn't fall or hit her head. Pt was sitting down and witnessed by husband.   Pt states she just doesn't feel well.   Pt denies any cp or sob.  100/60 HR 74 97% RA CBG 198 20 in left hand 350 fluids given

## 2024-08-28 ENCOUNTER — Other Ambulatory Visit: Payer: Self-pay | Admitting: Internal Medicine

## 2024-08-28 DIAGNOSIS — I10 Essential (primary) hypertension: Secondary | ICD-10-CM

## 2024-08-29 NOTE — Telephone Encounter (Signed)
 Requested Prescriptions  Pending Prescriptions Disp Refills   hydrochlorothiazide  (HYDRODIURIL ) 25 MG tablet [Pharmacy Med Name: HYDROCHLOROTHIAZIDE  TABS 25MG ] 90 tablet 0    Sig: TAKE 1 TABLET DAILY     Cardiovascular: Diuretics - Thiazide Failed - 08/29/2024  4:19 PM      Failed - Cr in normal range and within 180 days    Creatinine  Date Value Ref Range Status  08/22/2014 1.1 0.6 - 1.1 mg/dL Final   Creat  Date Value Ref Range Status  03/21/2024 1.06 (H) 0.60 - 0.95 mg/dL Final   Creatinine, Ser  Date Value Ref Range Status  08/25/2024 1.13 (H) 0.44 - 1.00 mg/dL Final         Failed - K in normal range and within 180 days    Potassium  Date Value Ref Range Status  08/25/2024 2.9 (L) 3.5 - 5.1 mmol/L Final  08/22/2014 3.8 3.5 - 5.1 mEq/L Final         Passed - Na in normal range and within 180 days    Sodium  Date Value Ref Range Status  08/25/2024 137 135 - 145 mmol/L Final  08/22/2014 138 136 - 145 mEq/L Final         Passed - Last BP in normal range    BP Readings from Last 1 Encounters:  08/25/24 (!) 116/51         Passed - Valid encounter within last 6 months    Recent Outpatient Visits           5 months ago Chronic midline low back pain with right-sided sciatica   [facility] [place], Angeline ORN, NP       Future Appointments             In 1 week Vernetta Lonni GRADE, MD [facility]   In 2 months Rice, Lonni ORN, MD [facility] - A Dept Of [place]

## 2024-09-06 ENCOUNTER — Ambulatory Visit: Admitting: Orthopaedic Surgery

## 2024-09-06 DIAGNOSIS — M1711 Unilateral primary osteoarthritis, right knee: Secondary | ICD-10-CM | POA: Diagnosis not present

## 2024-09-06 MED ORDER — HYALURONAN 30 MG/2ML IX SOSY
30.0000 mg | PREFILLED_SYRINGE | INTRA_ARTICULAR | Status: AC | PRN
  Administered 2024-09-06: 30 mg via INTRA_ARTICULAR

## 2024-09-06 NOTE — Progress Notes (Signed)
   Procedure Note  Patient: Miranda Mueller             Date of Birth: 1941-03-05           MRN: [id number]             Visit Date: 09/06/2024  Procedures: Visit Diagnoses:  1. Unilateral primary osteoarthritis, right knee     Large Joint Inj: R knee on 09/06/2024 11:42 AM Indications: diagnostic evaluation and pain Details: 22 G 1.5 in needle, superolateral approach  Arthrogram: No  Medications: 30 mg Hyaluronan 30 MG/2ML Outcome: tolerated well, no immediate complications Procedure, treatment alternatives, risks and benefits explained, specific risks discussed. Consent was given by the patient. Immediately prior to procedure a time out was called to verify the correct patient, procedure, equipment, support staff and site/side marked as required. Patient was prepped and draped in the usual sterile fashion.     The patient is an 83 year old female is here today for injection #1 of a series of 3 Orthovisc injections with hyaluronic acid to treat the pain from osteoarthritis in her right knee.  She has had no acute change in her medical status and understands fully why these injections are being recommended and performed.  Her right knee shows slight varus malalignment with no effusion.  There is good range of motion the knee with patellofemoral crepitation and global pain consistent with her osteoarthritis.  I did place Orthovisc No. 1 in her right knee without difficulty.  Will see her back for injection #2 in a week.  Lot #[id number]

## 2024-09-12 ENCOUNTER — Other Ambulatory Visit: Payer: Self-pay | Admitting: Internal Medicine

## 2024-09-12 DIAGNOSIS — G4733 Obstructive sleep apnea (adult) (pediatric): Secondary | ICD-10-CM

## 2024-09-12 DIAGNOSIS — I700 Atherosclerosis of aorta: Secondary | ICD-10-CM

## 2024-09-12 DIAGNOSIS — R7303 Prediabetes: Secondary | ICD-10-CM

## 2024-09-13 ENCOUNTER — Ambulatory Visit: Admitting: Physician Assistant

## 2024-09-13 DIAGNOSIS — M1711 Unilateral primary osteoarthritis, right knee: Secondary | ICD-10-CM | POA: Diagnosis not present

## 2024-09-13 MED ORDER — HYALURONAN 30 MG/2ML IX SOSY
30.0000 mg | PREFILLED_SYRINGE | INTRA_ARTICULAR | Status: AC | PRN
  Administered 2024-09-13: 30 mg via INTRA_ARTICULAR

## 2024-09-13 NOTE — Progress Notes (Signed)
 Office Visit Note   Patient: Miranda Mueller           Date of Birth: 1940-12-04           MRN: [id number] Visit Date: 09/13/2024              Requested by: Antonette Angeline ORN, NP [address] [place],  [place] [address] PCP: Antonette Angeline ORN, NP  No chief complaint on file.     HPI: Patient is an 83 year old woman patient of Dr. Damian comes in for her second Orthovisc injection into her right knee tolerated the first injection well  Assessment & Plan: Visit Diagnoses:  1. Unilateral primary osteoarthritis, right knee     Plan: Injection done without difficulty will follow-up for final injection in 1 week  Follow-Up Instructions: Return in about 1 week (around 09/20/2024).   Ortho Exam  Patient is alert, oriented, no adenopathy, well-dressed, normal affect, normal respiratory effort. Right knee no erythema no effusion compartments are soft and compressible    Imaging: No results found. No images are attached to the encounter.  Labs: Lab Results  Component Value Date   HGBA1C 5.8 (H) 03/21/2024   HGBA1C 5.9 (H) 09/20/2023   HGBA1C 5.8 (H) 01/06/2023   ESRSEDRATE 33 (H) 08/24/2024   ESRSEDRATE 33 (H) 03/21/2024   CRP 5.7 03/21/2024     Lab Results  Component Value Date   ALBUMIN 3.8 08/25/2024   ALBUMIN 3.3 (L) 11/29/2021   ALBUMIN 3.2 (L) 11/28/2021    Lab Results  Component Value Date   MG 2.1 11/28/2021   MG 1.8 11/27/2021   MG 1.8 11/26/2021   Lab Results  Component Value Date   VD25OH 26.57 (L) 07/02/2020   VD25OH 43.99 03/28/2019   VD25OH 31.91 12/09/2017    No results found for: PREALBUMIN    Latest Ref Rng & Units 08/25/2024    2:41 PM 03/21/2024    9:27 AM 09/20/2023    9:33 AM  CBC EXTENDED  WBC 4.0 - 10.5 K/uL 10.8  6.4  5.9   RBC 3.87 - 5.11 MIL/uL 4.20  4.68  4.69   Hemoglobin 12.0 - 15.0 g/dL 87.4  86.2  86.2   HCT 36.0 - 46.0 % 37.6  41.5  40.8   Platelets 150 - 400 K/uL 374  356  397      There is no height or weight on  file to calculate BMI.  Orders:  No orders of the defined types were placed in this encounter.  No orders of the defined types were placed in this encounter.    Procedures: Large Joint Inj: R knee on 09/13/2024 1:51 PM Indications: pain and diagnostic evaluation Details: 1.5 in anteromedial approach  Arthrogram: No  Medications: 30 mg Hyaluronan 30 MG/2ML Outcome: tolerated well, no immediate complications Procedure, treatment alternatives, risks and benefits explained, specific risks discussed. Consent was given by the patient.     Clinical Data: No additional findings.  ROS:  All other systems negative, except as noted in the HPI. Review of Systems  Objective: Vital Signs: There were no vitals taken for this visit.  Specialty Comments:  No specialty comments available.  PMFS History: Patient Active Problem List   Diagnosis Date Noted  . Positive ANA (antinuclear antibody) 08/24/2024  . Chronic pain of both knees 03/21/2024  . Chronic pain of right hand 03/21/2024  . Chronic pain of right wrist 03/21/2024  . Unilateral primary osteoarthritis, right knee 02/10/2023  . Aortic  atherosclerosis 02/23/2022  . CKD (chronic kidney disease) stage 3, GFR 30-59 ml/min (HCC) 07/03/2021  . Class 3 severe obesity due to excess calories with body mass index (BMI) of 40.0 to 44.9 in adult [facility]) 07/03/2021  . Prediabetes 07/02/2020  . Chronic midline low back pain with right-sided sciatica 07/02/2020  . Eczema 07/02/2020  . OSA (obstructive sleep apnea) 08/27/2015  . History of breast cancer 08/20/2014  . HLD (hyperlipidemia) 11/23/2007  . Essential hypertension 11/23/2007  . GERD 11/23/2007   Past Medical History:  Diagnosis Date  . Bradycardia   . Breast cancer (HCC) 08/29/14   Right Breast -High Grade Invasive Mammary  . Cataract    surgery  . GERD (gastroesophageal reflux disease)   . History of shingles   . Hx of radiation therapy 10/01/14- 11/20/14   right  breast/50.4 Gy/28 fx; right breast boost/10 Gy/5 fx  . Hyperlipidemia   . Hypertension   . IBS (irritable bowel syndrome)   . Multifocal PVCs   . PVC (premature ventricular contraction)    Hx of  . Sleep apnea    no cpap per pt     Family History  Problem Relation Age of Onset  . Hypertension Mother   . Heart disease Mother        CAD  . Breast cancer Mother 31  . Arthritis Mother   . Stroke Father   . Hypertension Sister   . Breast cancer Sister 66  . Aneurysm Sister   . Arthritis Brother   . Heart attack Brother        COD  . Hypertension Brother   . Prostate cancer Brother 27  . Heart disease Maternal Grandmother   . Hypertension Daughter   . Colon polyps Daughter   . Leukemia Maternal Uncle 70  . Leukemia Maternal Uncle 73  . Breast cancer Cousin        dx under 50  . Colon cancer Cousin        dx in her 37s  . Colon cancer Cousin   . Lymphoma Other 49       NHL  . Esophageal cancer Neg Hx   . Stomach cancer Neg Hx   . Rectal cancer Neg Hx     Past Surgical History:  Procedure Laterality Date  . BREAST LUMPECTOMY Right 08/29/14   started radiation 10/01/14  . CATARACT EXTRACTION  2009   bilateral  . CHOLECYSTECTOMY N/A 11/28/2021   Procedure: LAPAROSCOPIC CHOLECYSTECTOMY WITH INTRAOPERATIVE CHOLANGIOGRAM, LIVER BIOPSY;  Surgeon: Sheldon Standing, MD;  Location: [facility];  Service: General;  Laterality: N/A;  . COLONOSCOPY  2004-last 2016   normal  . KNEE ARTHROSCOPY     right  . NEEDLE CORE BIOPSY  RIGHT BREAST Right 08/14/14  . OTHER SURGICAL HISTORY Left 1997   breast,cyst  . PYLOROPLASTY  2016  . RADIOACTIVE SEED GUIDED PARTIAL MASTECTOMY WITH AXILLARY SENTINEL LYMPH NODE BIOPSY Right 08/29/2014   Procedure: SEED LOCALIZED RIGHT BREAST LUMPECTOMY AND RIGHT AXILLARY SENTTINEL LYMPH NODE BIOPSY;  Surgeon: Morene Olives, MD;  Location: [facility];  Service: General;  Laterality: Right;  . WISDOM TOOTH EXTRACTION     Social History    Occupational History    Comment: retired  Tobacco Use  . Smoking status: Never    Passive exposure: Past  . Smokeless tobacco: Never  Vaping Use  . Vaping status: Never Used  Substance and Sexual Activity  . Alcohol use: No    Alcohol/week: 0.0 standard drinks  of alcohol  . Drug use: No  . Sexual activity: Never

## 2024-09-14 ENCOUNTER — Ambulatory Visit: Admitting: Internal Medicine

## 2024-09-14 ENCOUNTER — Encounter: Payer: Self-pay | Admitting: Internal Medicine

## 2024-09-14 ENCOUNTER — Ambulatory Visit: Payer: Self-pay

## 2024-09-14 VITALS — BP 98/68 | Ht 66.5 in | Wt 222.8 lb

## 2024-09-14 DIAGNOSIS — E876 Hypokalemia: Secondary | ICD-10-CM | POA: Diagnosis not present

## 2024-09-14 DIAGNOSIS — E861 Hypovolemia: Secondary | ICD-10-CM | POA: Diagnosis not present

## 2024-09-14 DIAGNOSIS — E860 Dehydration: Secondary | ICD-10-CM

## 2024-09-14 DIAGNOSIS — R55 Syncope and collapse: Secondary | ICD-10-CM | POA: Diagnosis not present

## 2024-09-14 DIAGNOSIS — T50905A Adverse effect of unspecified drugs, medicaments and biological substances, initial encounter: Secondary | ICD-10-CM

## 2024-09-14 NOTE — Progress Notes (Signed)
 Subjective:    Patient ID: Miranda Mueller, female    DOB: 1941-06-19, 83 y.o.   MRN: [id number]  HPI  Discussed the use of AI scribe software for clinical note transcription with the patient, who gave verbal consent to proceed.   Miranda Mueller is an 83 year old female with a history of low potassium and syncopal episodes who presents with a recent syncopal episode. She is accompanied by her husband.  She experienced a syncopal episode yesterday while in the car with her husband. She felt unwell, with a sensation of not wanting to stand, followed by feeling hot and losing consciousness. This occurred around 3 PM, after receiving a knee injection at 1:45 PM and eating a hot dog. She was in and out of consciousness for seconds to minutes. After the episode, she went home, rested on the couch, and felt better.  She had a similar episode about a month ago at a [employer].  She was taken to [facility] for evaluation where her potassium was found to be low at 2.9. She was treated with fluids and potassium at the ER and sent home with a five-day potassium prescription. Her creatinine was 1.13 and her GFR was 48.  ECG showed multiple PVCs.  She reported no vomiting or diarrhea prior to that episode, but did experience vomiting the day before yesterday's episode after eating tangerines and a banana.  She has a history of low potassium, with a similar episode in April 2023. No daily lightheadedness, dizziness, weakness, chest pain, or shortness of breath.  She is currently on hydrochlorothiazide  and olmesartan  for blood pressure management.  Her BP today is 98/68.  She recently altered her hydrochlorothiazide  intake, taking it every other day last week and resuming daily intake this week. She occasionally checks her blood pressure at home but has not done so recently.  No issues with low blood sugar and has not been consuming sweet tea, although she accidentally drank some sweet tea yesterday, which made her feel  unwell. She has not experienced any heart racing.       Review of Systems     Past Medical History:  Diagnosis Date   Bradycardia    Breast cancer (HCC) 08/29/14   Right Breast -High Grade Invasive Mammary   Cataract    surgery   GERD (gastroesophageal reflux disease)    History of shingles    Hx of radiation therapy 10/01/14- 11/20/14   right breast/50.4 Gy/28 fx; right breast boost/10 Gy/5 fx   Hyperlipidemia    Hypertension    IBS (irritable bowel syndrome)    Multifocal PVCs    PVC (premature ventricular contraction)    Hx of   Sleep apnea    no cpap per pt     Current Outpatient Medications  Medication Sig Dispense Refill   acetaminophen  (TYLENOL ) 500 MG tablet Take 500 mg by mouth every 6 (six) hours as needed.     aspirin 81 MG tablet Take 81 mg by mouth daily.     atorvastatin  (LIPITOR) 10 MG tablet Take 1 tablet (10 mg total) by mouth daily. 90 tablet 1   betamethasone  dipropionate 0.05 % cream APPLY TOPICALLY AS NEEDED 30 g 11   cetirizine (ZYRTEC) 5 MG chewable tablet Chew 5 mg by mouth daily.     esomeprazole  (NEXIUM ) 40 MG capsule Take 1 capsule (40 mg total) by mouth daily. 90 capsule 1   fluticasone  (FLONASE ) 50 MCG/ACT nasal spray TAKE 2 SPRAYS INTO BOTH NOSTRILS  DAILY (Patient taking differently: 2 sprays as needed for allergies. TAKE 2 SPRAYS INTO BOTH NOSTRILS DAILY) 16 g 4   hydrochlorothiazide  (HYDRODIURIL ) 25 MG tablet TAKE 1 TABLET DAILY 90 tablet 0   methocarbamol  (ROBAXIN ) 500 MG tablet TAKE 1 TABLET DAILY AS NEEDED FOR MUSCLE SPASMS (Patient taking differently: as needed. TAKE 1 TABLET DAILY AS NEEDED FOR MUSCLE SPASMS) 90 tablet 1   Multiple Vitamins-Minerals (HAIR/SKIN/NAILS PO) Take 1 tablet by mouth daily.     olmesartan  (BENICAR ) 20 MG tablet Take 1 tablet (20 mg total) by mouth daily. 90 tablet 1   polycarbophil (FIBERCON) 625 MG tablet Take 1 tablet (625 mg total) by mouth 2 (two) times daily. 60 tablet 0   potassium chloride  SA (KLOR-CON  M) 20  MEQ tablet Take 1 tablet (20 mEq total) by mouth 2 (two) times daily for 5 days. 10 tablet 0   RESTASIS  0.05 % ophthalmic emulsion Place 1 drop into both eyes 2 (two) times daily.     semaglutide -weight management (WEGOVY ) 0.25 MG/0.5ML SOAJ SQ injection Inject 0.25 mg into the skin once a week. 2 mL 1   Zinc  50 MG CAPS Take by mouth.     No current facility-administered medications for this visit.    No Known Allergies  Family History  Problem Relation Age of Onset   Hypertension Mother    Heart disease Mother        CAD   Breast cancer Mother 81   Arthritis Mother    Stroke Father    Hypertension Sister    Breast cancer Sister 31   Aneurysm Sister    Arthritis Brother    Heart attack Brother        COD   Hypertension Brother    Prostate cancer Brother 69   Heart disease Maternal Grandmother    Hypertension Daughter    Colon polyps Daughter    Leukemia Maternal Uncle 67   Leukemia Maternal Uncle 56   Breast cancer Cousin        dx under 50   Colon cancer Cousin        dx in her 72s   Colon cancer Cousin    Lymphoma Other 49       NHL   Esophageal cancer Neg Hx    Stomach cancer Neg Hx    Rectal cancer Neg Hx     Social History   Socioeconomic History   Marital status: Married    Spouse name: Homer   Number of children: 1   Years of education: 12   Highest education level: Not on file  Occupational History    Comment: retired  Tobacco Use   Smoking status: Never    Passive exposure: Past   Smokeless tobacco: Never  Vaping Use   Vaping status: Never Used  Substance and Sexual Activity   Alcohol use: No    Alcohol/week: 0.0 standard drinks of alcohol   Drug use: No   Sexual activity: Never  Other Topics Concern   Not on file  Social History Narrative   Consumes one glass of caffeine daily   Social Drivers of Health   Financial Resource Strain: Low Risk  (09/20/2023)   Overall Financial Resource Strain (CARDIA)    Difficulty of Paying Living  Expenses: Not hard at all  Food Insecurity: No Food Insecurity (09/20/2023)   Hunger Vital Sign    Worried About Running Out of Food in the Last Year: Never true    Ran Out of  Food in the Last Year: Never true  Transportation Needs: No Transportation Needs (09/20/2023)   PRAPARE - Administrator, Civil Service (Medical): No    Lack of Transportation (Non-Medical): No  Physical Activity: Insufficiently Active (09/20/2023)   Exercise Vital Sign    Days of Exercise per Week: 1 day    Minutes of Exercise per Session: 10 min  Stress: No Stress Concern Present (09/20/2023)   [employer] of Occupational Health - Occupational Stress Questionnaire    Feeling of Stress : Not at all  Social Connections: Moderately Integrated (09/20/2023)   Social Connection and Isolation Panel    Frequency of Communication with Friends and Family: More than three times a week    Frequency of Social Gatherings with Friends and Family: More than three times a week    Attends Religious Services: More than 4 times per year    Active Member of [employer] or Organizations: No    Attends Banker Meetings: Never    Marital Status: Married  Catering Manager Violence: Not At Risk (09/20/2023)   Humiliation, Afraid, Rape, and Kick questionnaire    Fear of Current or Ex-Partner: No    Emotionally Abused: No    Physically Abused: No    Sexually Abused: No     Constitutional: Denies fever, malaise, fatigue, headache or abrupt weight changes.  HEENT: Denies eye pain, eye redness, ear pain, ringing in the ears, wax buildup, runny nose, nasal congestion, bloody nose, or sore throat. Respiratory: Denies difficulty breathing, shortness of breath, cough or sputum production.   Cardiovascular: Denies chest pain, chest tightness, palpitations or swelling in the hands or feet.  Gastrointestinal: Denies abdominal pain, bloating, constipation, diarrhea or blood in the stool.  GU: Denies urgency, frequency, pain  with urination, burning sensation, blood in urine, odor or discharge. Musculoskeletal: Patient reports chronic back/knee pain, chronic right wrist and hand pain.  Denies decrease in range of motion, difficulty with gait, or joint swelling.  Skin: Patient reports dry skin.  Denies redness, rashes, lesions or ulcercations.  Neurological: Patient reports syncope, dizziness.  Denies difficulty with memory, difficulty with speech or problems with balance and coordination.  Psych: Denies anxiety, depression, SI/HI.  No other specific complaints in a complete review of systems (except as listed in HPI above).  Objective:   Physical Exam  BP 98/68 (BP Location: Left Arm, Patient Position: Sitting, Cuff Size: Large)   Ht 5' 6.5 (1.689 m)   Wt 222 lb 12.8 oz (101.1 kg)   SpO2 96%   BMI 35.42 kg/m      Wt Readings from Last 3 Encounters:  08/24/24 227 lb 3.2 oz (103.1 kg)  03/21/24 253 lb 9.6 oz (115 kg)  09/20/23 255 lb 12.8 oz (116 kg)    General: Appears her stated age, obese in NAD. Skin: Warm, dry and intact.   HEENT: Head: normal shape and size; Eyes: sclera white, no icterus, conjunctiva pink, PERRLA and EOMs intact;  Cardiovascular: Normal rate and rhythm. S1,S2 noted.  No murmur, rubs or gallops noted. Pulmonary/Chest: Normal effort and positive vesicular breath sounds. No respiratory distress. No wheezes, rales or ronchi noted.  Musculoskeletal: No difficulty with gait. Neurological: Alert and oriented.Coordination normal.    BMET    Component Value Date/Time   NA 137 08/25/2024 1441   NA 138 08/22/2014 1237   K 2.9 (L) 08/25/2024 1441   K 3.8 08/22/2014 1237   CL 102 08/25/2024 1441   CO2 23 08/25/2024 1441  CO2 28 08/22/2014 1237   GLUCOSE 125 (H) 08/25/2024 1441   GLUCOSE 102 08/22/2014 1237   BUN 18 08/25/2024 1441   BUN 16.0 08/22/2014 1237   CREATININE 1.13 (H) 08/25/2024 1441   CREATININE 1.06 (H) 03/21/2024 0927   CREATININE 1.1 08/22/2014 1237   CALCIUM   8.3 (L) 08/25/2024 1441   CALCIUM  9.8 08/22/2014 1237   GFRNONAA 48 (L) 08/25/2024 1441   GFRAA 72 12/01/2007 1105    Lipid Panel     Component Value Date/Time   CHOL 137 03/21/2024 0927   TRIG 99 03/21/2024 0927   HDL 50 03/21/2024 0927   CHOLHDL 2.7 03/21/2024 0927   VLDL 29.8 07/02/2020 1009   LDLCALC 69 03/21/2024 0927    CBC    Component Value Date/Time   WBC 10.8 (H) 08/25/2024 1441   RBC 4.20 08/25/2024 1441   HGB 12.5 08/25/2024 1441   HGB 13.9 08/22/2014 1237   HCT 37.6 08/25/2024 1441   HCT 42.7 08/22/2014 1237   PLT 374 08/25/2024 1441   PLT 345 08/22/2014 1237   MCV 89.5 08/25/2024 1441   MCV 88.7 08/22/2014 1237   MCH 29.8 08/25/2024 1441   MCHC 33.2 08/25/2024 1441   RDW 13.2 08/25/2024 1441   RDW 13.8 08/22/2014 1237   LYMPHSABS 2.9 08/22/2014 1237   MONOABS 0.8 08/22/2014 1237   EOSABS 0.4 08/22/2014 1237   BASOSABS 0.1 08/22/2014 1237    Hgb A1C Lab Results  Component Value Date   HGBA1C 5.8 (H) 03/21/2024           Assessment & Plan:   Assessment and Plan    ER follow-up for recurrent syncope and presyncope ER notes, labs and procedures were reviewed.  Episodes possibly related to dehydration, hypotension, and hypokalemia. Low blood pressure may contribute to syncope. Negative orthostatic vitals. - Stopped hydrochlorothiazide  due to potential contribution to hypokalemia and hypotension. - Ordered blood work to assess potassium levels and kidney function. -Orthostatics negative - Consider heart monitor if syncope persists despite stopping hydrochlorothiazide .  Hypokalemia Previous episodes with low potassium levels, possibly exacerbated by hydrochlorothiazide . Low potassium may contribute to syncope. - Ordered blood work to assess current potassium levels. - Stopped hydrochlorothiazide  to prevent further potassium depletion.  Dehydration Possible dehydration contributing to syncope and hypotension. - Encourage adequate water  intake  Hypotension Low blood pressure may contribute to syncope, possibly related to dehydration and medication use. - Stopped hydrochlorothiazide  to prevent further lowering of blood pressure. -Orthostatics negative - Monitor blood pressure at home.        RTC in 1 week for follow-up of chronic conditions Angeline Laura, NP

## 2024-09-14 NOTE — Patient Instructions (Signed)
 Hypokalemia Hypokalemia means that the amount of potassium in the blood is lower than normal. Potassium is a mineral (electrolyte) that helps regulate the amount of fluid in the body. It also stimulates muscle tightening (contraction) and helps nerves work properly. Normally, most of the body's potassium is inside cells, and only a very small amount is in the blood. Because the amount in the blood is so small, minor changes to potassium levels in the blood can be life-threatening. What are the causes? This condition may be caused by: Antibiotic medicine. Diarrhea or vomiting. Taking too much of a medicine that helps you have a bowel movement (laxative) can cause diarrhea and lead to hypokalemia. Chronic kidney disease (CKD). Medicines that help the body get rid of excess fluid (diuretics). Eating disorders, such as anorexia or bulimia. Low magnesium levels in the body. Sweating a lot. What are the signs or symptoms? Symptoms of this condition include: Weakness. Constipation. Fatigue. Muscle cramps. Mental confusion. Skipped heartbeats or irregular heartbeat (palpitations). Tingling or numbness. How is this diagnosed? This condition is diagnosed with a blood test. How is this treated? This condition may be treated by: Taking potassium supplements. Adjusting the medicines that you take. Eating more foods that contain a lot of potassium. If your potassium level is very low, you may need to get potassium through an IV and be monitored in the hospital. Follow these instructions at home: Eating and drinking  Eat a healthy diet. A healthy diet includes fresh fruits and vegetables, whole grains, healthy fats, and lean proteins. If told, eat more foods that contain a lot of potassium. These include: Nuts, such as peanuts and pistachios. Seeds, such as sunflower seeds and pumpkin seeds. Peas, lentils, and lima beans. Whole grain and bran cereals and breads. Fresh fruits and vegetables,  such as apricots, avocado, bananas, cantaloupe, kiwi, oranges, tomatoes, asparagus, and potatoes. Juices, such as orange, tomato, and prune. Lean meats, including fish. Milk and milk products, such as yogurt. General instructions Take over-the-counter and prescription medicines only as told by your health care provider. This includes vitamins, natural food products, and supplements. Keep all follow-up visits. This is important. Contact a health care provider if: You have weakness that gets worse. You feel your heart pounding or racing. You vomit. You have diarrhea. You have diabetes and you have trouble keeping your blood sugar in your target range. Get help right away if: You have chest pain. You have shortness of breath. You have vomiting or diarrhea that lasts for more than 2 days. You faint. These symptoms may be an emergency. Get help right away. Call 911. Do not wait to see if the symptoms will go away. Do not drive yourself to the hospital. Summary Hypokalemia means that the amount of potassium in the blood is lower than normal. This condition is diagnosed with a blood test. Hypokalemia may be treated by taking potassium supplements, adjusting the medicines that you take, or eating more foods that are high in potassium. If your potassium level is very low, you may need to get potassium through an IV and be monitored in the hospital. This information is not intended to replace advice given to you by your health care provider. Make sure you discuss any questions you have with your health care provider. Document Revised: 06/12/2021 Document Reviewed: 06/12/2021 Elsevier Patient Education  2024 [employer].

## 2024-09-14 NOTE — Telephone Encounter (Signed)
Will discuss at upcoming appointment today 

## 2024-09-14 NOTE — Telephone Encounter (Signed)
 FYI Only or Action Required?: FYI only for provider: appointment scheduled on today.  Patient was last seen in primary care on 03/21/2024 by Antonette Angeline ORN, NP.  Called Nurse Triage reporting Loss of Consciousness.09/13/24  Symptoms began yesterday.  Interventions attempted: Rest, hydration, or home remedies.  Symptoms are: completely resolved.  Triage Disposition: See Physician Within 4 Hours (or PCP Triage) (overriding See PCP Within 2 Weeks)  Patient/caregiver understands and will follow disposition?: Yes   Copied from CRM #[id number]. Topic: Clinical - Red Word Triage >> Sep 14, 2024  8:53 AM Darshell M wrote: Red Word that prompted transfer to Nurse Triage: Patient had an episode on 11/14 where she passed out. Patient was transported to ER and dx with low potassium. She was prescribed fluids, given 2 potassium pills at the ER and then given a prescription for 2 potassium pills per day for 5 days. Patient took medication as prescribed but did not follow up with PCP as advised since she had an appointment already scheduled 09/21/24. Yesterday patient had another episode, but did not seek medical treatment. Reason for Disposition  Simple fainting is a chronic symptom (has occurred multiple times)  Answer Assessment - Initial Assessment Questions 1. ONSET: How long were you unconscious? (e.g., minutes, seconds) When did it happen?    09/13/24 for  Few minutes-did not seek medical attention 2. CONTENT: What happened during the period of unconsciousness? (e.g., seizure activity)      Husband present no seizure activity or unusual movements 3. MENTAL STATUS: Alert and oriented now? (e.g., oriented x 3 = name, month, location)      A&O feels well  4. TRIGGER: What do you think caused the fainting? What were you doing just before you fainted?  (e.g., exercise, sudden standing up, prolonged standing)     unsure 5. RECURRENT SYMPTOM: Have you ever passed out before? If Yes, ask:  When was the last time? and What happened that time?      Yes, last time in October was noted to be hypokalemic 6. INJURY: Did you hurt yourself when you fell?      denies 7. CARDIAC SYMPTOMS: Have you had any of the following symptoms: chest pain, difficulty breathing, palpitations?     denies 8. NEUROLOGIC SYMPTOMS: Have you had any of the following symptoms: headache, numbness, vertigo, weakness?     denies 9. GI SYMPTOMS: Have you had any of the following symptoms: abdomen pain, vomiting, diarrhea, blood in stools?     denies 10. OTHER SYMPTOMS: Do you have any other symptoms?       denies 11. PREGNANCY: Is there any chance you are pregnant? When was your last menstrual period?  Protocols used: [employer]

## 2024-09-15 ENCOUNTER — Ambulatory Visit: Payer: Self-pay | Admitting: Internal Medicine

## 2024-09-15 LAB — BASIC METABOLIC PANEL WITH GFR
BUN/Creatinine Ratio: 17 (calc) (ref 6–22)
BUN: 19 mg/dL (ref 7–25)
CO2: 27 mmol/L (ref 20–32)
Calcium: 9.1 mg/dL (ref 8.6–10.4)
Chloride: 98 mmol/L (ref 98–110)
Creat: 1.09 mg/dL — ABNORMAL HIGH (ref 0.60–0.95)
Glucose, Bld: 81 mg/dL (ref 65–99)
Potassium: 4.3 mmol/L (ref 3.5–5.3)
Sodium: 136 mmol/L (ref 135–146)
eGFR: 51 mL/min/1.73m2 — ABNORMAL LOW (ref >=60)

## 2024-09-15 MED ORDER — SEMAGLUTIDE-WEIGHT MANAGEMENT 0.5 MG/0.5ML ~~LOC~~ SOAJ: 0.5000 mg | SUBCUTANEOUS | 0 refills | Status: AC

## 2024-09-15 NOTE — Telephone Encounter (Signed)
 Requested medication (s) are due for refill today: yes   Requested medication (s) are on the active medication list: yes   Last refill:  08/15/24 #2 ml 1 refills  Future visit scheduled: yes 09/21/24  Notes to clinic:  protocol failed last lab 03/21/24. Do you want to refill Rx? Pharmacy requesting to renew Rx     Requested Prescriptions  Pending Prescriptions Disp Refills   WEGOVY  0.25 MG/0.5ML SOAJ SQ injection [Pharmacy Med Name: WEGOVY  0.25 MG/0.5ML SUBQ SOLN ML] 2 mL 1    Sig: Inject 0.25 mg into the skin once a week.     Endocrinology:  Diabetes - GLP-1 Receptor Agonists - semaglutide  Failed - 09/15/2024  7:36 AM      Failed - HBA1C in normal range and within 180 days    Hgb A1c MFr Bld  Date Value Ref Range Status  03/21/2024 5.8 (H) <5.7 % Final    Comment:    For someone without known diabetes, a hemoglobin  A1c value between 5.7% and 6.4% is consistent with prediabetes and should be confirmed with a  follow-up test. . For someone with known diabetes, a value <7% indicates that their diabetes is well controlled. A1c targets should be individualized based on duration of diabetes, age, comorbid conditions, and other considerations. . This assay result is consistent with an increased risk of diabetes. . Currently, no consensus exists regarding use of hemoglobin A1c for diagnosis of diabetes for children. .          Failed - Cr in normal range and within 360 days    Creatinine  Date Value Ref Range Status  08/22/2014 1.1 0.6 - 1.1 mg/dL Final   Creat  Date Value Ref Range Status  09/14/2024 1.09 (H) 0.60 - 0.95 mg/dL Final         Passed - Valid encounter within last 6 months    Recent Outpatient Visits           Yesterday Near syncope   [place] [facility] [place], [place] W, NP   5 months ago Chronic midline low back pain with right-sided sciatica   [place] [facility] [place], Angeline ORN, NP       Future  Appointments             In 1 month Rice, Lonni ORN, MD [facility] - A Dept Of [place]. [facility]

## 2024-09-15 NOTE — Telephone Encounter (Signed)
ZOXWRU 0.5mg  weekly sent to pharmacy

## 2024-09-15 NOTE — Addendum Note (Signed)
 Addended by: ANTONETTE ANGELINE ORN on: 09/15/2024 01:18 PM   Modules accepted: Orders

## 2024-09-18 ENCOUNTER — Other Ambulatory Visit: Payer: Self-pay | Admitting: Internal Medicine

## 2024-09-18 DIAGNOSIS — I10 Essential (primary) hypertension: Secondary | ICD-10-CM

## 2024-09-19 NOTE — Telephone Encounter (Signed)
 Requested Prescriptions  Pending Prescriptions Disp Refills   olmesartan  (BENICAR ) 20 MG tablet [Pharmacy Med Name: OLMESARTAN  MEDOXOMIL TABS 20MG ] 90 tablet 0    Sig: TAKE 1 TABLET DAILY     Cardiovascular:  Angiotensin Receptor Blockers Failed - 09/19/2024  3:58 PM      Failed - Cr in normal range and within 180 days    Creatinine  Date Value Ref Range Status  08/22/2014 1.1 0.6 - 1.1 mg/dL Final   Creat  Date Value Ref Range Status  09/14/2024 1.09 (H) 0.60 - 0.95 mg/dL Final         Failed - Valid encounter within last 6 months    Recent Outpatient Visits           5 days ago Near syncope   [place] [facility] [place], Angeline ORN, NP   6 months ago Chronic midline low back pain with right-sided sciatica    [facility] [place], Angeline ORN, NP       Future Appointments             In 1 month Rice, Lonni ORN, MD [facility]ology - A Dept Of [place]. [facility]            Passed - K in normal range and within 180 days    Potassium  Date Value Ref Range Status  09/14/2024 4.3 3.5 - 5.3 mmol/L Final  08/22/2014 3.8 3.5 - 5.1 mEq/L Final         Passed - Patient is not pregnant      Passed - Last BP in normal range    BP Readings from Last 1 Encounters:  09/14/24 98/68

## 2024-09-20 ENCOUNTER — Ambulatory Visit (INDEPENDENT_AMBULATORY_CARE_PROVIDER_SITE_OTHER): Admitting: Physician Assistant

## 2024-09-20 DIAGNOSIS — M1711 Unilateral primary osteoarthritis, right knee: Secondary | ICD-10-CM | POA: Diagnosis not present

## 2024-09-20 MED ORDER — HYALURONAN 30 MG/2ML IX SOSY
30.0000 mg | PREFILLED_SYRINGE | INTRA_ARTICULAR | Status: AC | PRN
  Administered 2024-09-20: 30 mg via INTRA_ARTICULAR

## 2024-09-20 MED ORDER — METHYLPREDNISOLONE ACETATE 40 MG/ML IJ SUSP
40.0000 mg | INTRAMUSCULAR | Status: AC | PRN
  Administered 2024-09-20: 40 mg via INTRA_ARTICULAR

## 2024-09-20 NOTE — Progress Notes (Signed)
 Office Visit Note   Patient: Miranda Mueller           Date of Birth: 06-Feb-1941           MRN: [id number] Visit Date: 09/20/2024              Requested by: Antonette Angeline ORN, NP [address] [place],  [place] [address] PCP: Antonette Angeline ORN, NP  No chief complaint on file.     HPI: Patient is a pleasant 83 year old woman comes in for her third Orthovisc injection into her right knee has tolerated the previous well last  Assessment & Plan: Visit Diagnoses:  1. Unilateral primary osteoarthritis, right knee     Plan: Went forward with injection without difficulty May follow-up as needed  Follow-Up Instructions: No follow-ups on file.   Ortho Exam  Patient is alert, oriented, no adenopathy, well-dressed, normal affect, normal respiratory effort. Right knee no effusion no erythema compartments are soft and compressible neurovascular intact    Imaging: No results found. No images are attached to the encounter.  Labs: Lab Results  Component Value Date   HGBA1C 5.8 (H) 03/21/2024   HGBA1C 5.9 (H) 09/20/2023   HGBA1C 5.8 (H) 01/06/2023   ESRSEDRATE 33 (H) 08/24/2024   ESRSEDRATE 33 (H) 03/21/2024   CRP 5.7 03/21/2024     Lab Results  Component Value Date   ALBUMIN 3.8 08/25/2024   ALBUMIN 3.3 (L) 11/29/2021   ALBUMIN 3.2 (L) 11/28/2021    Lab Results  Component Value Date   MG 2.1 11/28/2021   MG 1.8 11/27/2021   MG 1.8 11/26/2021   Lab Results  Component Value Date   VD25OH 26.57 (L) 07/02/2020   VD25OH 43.99 03/28/2019   VD25OH 31.91 12/09/2017    No results found for: PREALBUMIN    Latest Ref Rng & Units 08/25/2024    2:41 PM 03/21/2024    9:27 AM 09/20/2023    9:33 AM  CBC EXTENDED  WBC 4.0 - 10.5 K/uL 10.8  6.4  5.9   RBC 3.87 - 5.11 MIL/uL 4.20  4.68  4.69   Hemoglobin 12.0 - 15.0 g/dL 87.4  86.2  86.2   HCT 36.0 - 46.0 % 37.6  41.5  40.8   Platelets 150 - 400 K/uL 374  356  397      There is no height or weight on file to calculate  BMI.  Orders:  No orders of the defined types were placed in this encounter.  No orders of the defined types were placed in this encounter.    Procedures: Large Joint Inj: R knee on 09/20/2024 2:04 PM Indications: pain and diagnostic evaluation Details: 22 G 1.5 in needle, anteromedial approach  Arthrogram: No  Medications: 40 mg methylPREDNISolone  acetate 40 MG/ML; 30 mg Hyaluronan 30 MG/2ML Outcome: tolerated well, no immediate complications Procedure, treatment alternatives, risks and benefits explained, specific risks discussed. Consent was given by the patient.      Clinical Data: No additional findings.  ROS:  All other systems negative, except as noted in the HPI. Review of Systems  Objective: Vital Signs: There were no vitals taken for this visit.  Specialty Comments:  No specialty comments available.  PMFS History: Patient Active Problem List   Diagnosis Date Noted   Chronic pain of both knees 03/21/2024   Chronic pain of right hand 03/21/2024   Chronic pain of right wrist 03/21/2024   Aortic atherosclerosis 02/23/2022   CKD (chronic kidney disease) stage 3,  GFR 30-59 ml/min (HCC) 07/03/2021   Class 3 severe obesity due to excess calories with body mass index (BMI) of 40.0 to 44.9 in adult (HCC) 07/03/2021   Prediabetes 07/02/2020   Chronic midline low back pain with right-sided sciatica 07/02/2020   Eczema 07/02/2020   OSA (obstructive sleep apnea) 08/27/2015   History of breast cancer 08/20/2014   HLD (hyperlipidemia) 11/23/2007   Essential hypertension 11/23/2007   GERD 11/23/2007   Past Medical History:  Diagnosis Date   Arthritis    Bradycardia    Breast cancer (HCC) 08/29/2014   Right Breast -High Grade Invasive Mammary   Cataract    surgery   GERD (gastroesophageal reflux disease)    History of shingles    Hx of radiation therapy 10/01/14- 11/20/14   right breast/50.4 Gy/28 fx; right breast boost/10 Gy/5 fx   Hyperlipidemia     Hypertension    IBS (irritable bowel syndrome)    Multifocal PVCs    PVC (premature ventricular contraction)    Hx of   Sleep apnea    no cpap per pt     Family History  Problem Relation Age of Onset   Hypertension Mother    Heart disease Mother        CAD   Breast cancer Mother 26   Arthritis Mother    Stroke Father    Hypertension Sister    Breast cancer Sister 97   Aneurysm Sister    Arthritis Brother    Heart attack Brother        COD   Hypertension Brother    Prostate cancer Brother 75   Heart disease Maternal Grandmother    Hypertension Daughter    Colon polyps Daughter    Leukemia Maternal Uncle 60   Leukemia Maternal Uncle 80   Breast cancer Cousin        dx under 50   Colon cancer Cousin        dx in her 55s   Colon cancer Cousin    Lymphoma Other 49       NHL   Esophageal cancer Neg Hx    Stomach cancer Neg Hx    Rectal cancer Neg Hx     Past Surgical History:  Procedure Laterality Date   BREAST LUMPECTOMY Right 08/29/14   started radiation 10/01/14   CATARACT EXTRACTION  2009   bilateral   CHOLECYSTECTOMY N/A 11/28/2021   Procedure: LAPAROSCOPIC CHOLECYSTECTOMY WITH INTRAOPERATIVE CHOLANGIOGRAM, LIVER BIOPSY;  Surgeon: Sheldon Standing, MD;  Location: [facility];  Service: General;  Laterality: N/A;   COLONOSCOPY  2004-last 2016   normal   KNEE ARTHROSCOPY     right   NEEDLE CORE BIOPSY  RIGHT BREAST Right 08/14/14   OTHER SURGICAL HISTORY Left 1997   breast,cyst   PYLOROPLASTY  2016   RADIOACTIVE SEED GUIDED PARTIAL MASTECTOMY WITH AXILLARY SENTINEL LYMPH NODE BIOPSY Right 08/29/2014   Procedure: SEED LOCALIZED RIGHT BREAST LUMPECTOMY AND RIGHT AXILLARY SENTTINEL LYMPH NODE BIOPSY;  Surgeon: Morene Olives, MD;  Location: [facility];  Service: General;  Laterality: Right;   WISDOM TOOTH EXTRACTION     Social History   Occupational History    Comment: retired  Tobacco Use   Smoking status: Never    Passive exposure: Past    Smokeless tobacco: Never  Vaping Use   Vaping status: Never Used  Substance and Sexual Activity   Alcohol use: No    Alcohol/week: 0.0 standard drinks of alcohol   Drug use:  No   Sexual activity: Never

## 2024-09-21 ENCOUNTER — Encounter: Payer: Self-pay | Admitting: Internal Medicine

## 2024-09-21 ENCOUNTER — Telehealth: Payer: Self-pay

## 2024-09-21 ENCOUNTER — Other Ambulatory Visit (HOSPITAL_COMMUNITY): Payer: Self-pay

## 2024-09-21 ENCOUNTER — Ambulatory Visit: Admitting: Internal Medicine

## 2024-09-21 VITALS — BP 122/82 | Ht 66.5 in | Wt 222.0 lb

## 2024-09-21 DIAGNOSIS — M25561 Pain in right knee: Secondary | ICD-10-CM | POA: Diagnosis not present

## 2024-09-21 DIAGNOSIS — E66812 Obesity, class 2: Secondary | ICD-10-CM | POA: Diagnosis not present

## 2024-09-21 DIAGNOSIS — I700 Atherosclerosis of aorta: Secondary | ICD-10-CM | POA: Diagnosis not present

## 2024-09-21 DIAGNOSIS — G4733 Obstructive sleep apnea (adult) (pediatric): Secondary | ICD-10-CM

## 2024-09-21 DIAGNOSIS — R7303 Prediabetes: Secondary | ICD-10-CM | POA: Diagnosis not present

## 2024-09-21 DIAGNOSIS — M5441 Lumbago with sciatica, right side: Secondary | ICD-10-CM | POA: Diagnosis not present

## 2024-09-21 DIAGNOSIS — E782 Mixed hyperlipidemia: Secondary | ICD-10-CM | POA: Diagnosis not present

## 2024-09-21 DIAGNOSIS — I10 Essential (primary) hypertension: Secondary | ICD-10-CM | POA: Diagnosis not present

## 2024-09-21 DIAGNOSIS — N1831 Chronic kidney disease, stage 3a: Secondary | ICD-10-CM | POA: Diagnosis not present

## 2024-09-21 DIAGNOSIS — L308 Other specified dermatitis: Secondary | ICD-10-CM | POA: Diagnosis not present

## 2024-09-21 DIAGNOSIS — Z6835 Body mass index (BMI) 35.0-35.9, adult: Secondary | ICD-10-CM

## 2024-09-21 DIAGNOSIS — Z853 Personal history of malignant neoplasm of breast: Secondary | ICD-10-CM | POA: Diagnosis not present

## 2024-09-21 DIAGNOSIS — K219 Gastro-esophageal reflux disease without esophagitis: Secondary | ICD-10-CM | POA: Diagnosis not present

## 2024-09-21 DIAGNOSIS — G8929 Other chronic pain: Secondary | ICD-10-CM

## 2024-09-21 NOTE — Progress Notes (Signed)
 Subjective:    Patient ID: Miranda Mueller, female    DOB: 11/28/1940, 83 y.o.   MRN: [id number]  HPI  Patient presents to clinic today for 98-month follow-up of chronic conditions.  History of breast cancer: In remission.  She gets yearly mammograms.  She no longer follows with oncology.  GERD: She is not sure what triggers this.  She has occasional breakthrough on esomeprazole  for which she takes mylanta with good results.  There is no upper GI on file.  HLD with aortic atherosclerosis: Her last LDL was 69, triglycerides 99, 03/2024.  She denies myalgias on atorvastatin .  She is taking aspirin as well.  She tries to consume a low-fat diet.  HTN: Her BP today is 130/84.  She is taking olmesartan  as prescribed.  HCTZ  was recently discontinued due to recurrent near syncope episodes related to possible dehydration.  Orthostatics were recently checked and negative.  ECG from 08/2024 reviewed.  OSA: She averages 8 hours of sleep per night without the use of her CPAP.  There is no sleep study on file.  Chronic back/knee pain:  She has right knee injection yesterday.  Managed with tylenol  and methocarbamol  as needed.  She follows with orthopedics  Eczema: Managed with betamethasone  cream.  She does not follow with dermatology.  Prediabetes: Her last A1c was 5.8%, 03/2024.  She does not check her sugars.  She is not taking any oral diabetic medication at this time.  CKD 3: Her last creatinine was 1.09, GFR 51, 09/2024.  She is on olmesartan  for renal protection.  She does not follow with nephrology.  Review of Systems     Past Medical History:  Diagnosis Date   Arthritis    Bradycardia    Breast cancer (HCC) 08/29/2014   Right Breast -High Grade Invasive Mammary   Cataract    surgery   GERD (gastroesophageal reflux disease)    History of shingles    Hx of radiation therapy 10/01/14- 11/20/14   right breast/50.4 Gy/28 fx; right breast boost/10 Gy/5 fx   Hyperlipidemia    Hypertension     IBS (irritable bowel syndrome)    Multifocal PVCs    PVC (premature ventricular contraction)    Hx of   Sleep apnea    no cpap per pt     Current Outpatient Medications  Medication Sig Dispense Refill   acetaminophen  (TYLENOL ) 500 MG tablet Take 500 mg by mouth every 6 (six) hours as needed.     aspirin 81 MG tablet Take 81 mg by mouth daily.     atorvastatin  (LIPITOR) 10 MG tablet Take 1 tablet (10 mg total) by mouth daily. 90 tablet 1   betamethasone  dipropionate 0.05 % cream APPLY TOPICALLY AS NEEDED 30 g 11   cetirizine (ZYRTEC) 5 MG chewable tablet Chew 5 mg by mouth daily.     esomeprazole  (NEXIUM ) 40 MG capsule Take 1 capsule (40 mg total) by mouth daily. 90 capsule 1   fluticasone  (FLONASE ) 50 MCG/ACT nasal spray TAKE 2 SPRAYS INTO BOTH NOSTRILS DAILY (Patient taking differently: 2 sprays as needed for allergies. TAKE 2 SPRAYS INTO BOTH NOSTRILS DAILY) 16 g 4   hydrochlorothiazide  (HYDRODIURIL ) 25 MG tablet TAKE 1 TABLET DAILY 90 tablet 0   methocarbamol  (ROBAXIN ) 500 MG tablet TAKE 1 TABLET DAILY AS NEEDED FOR MUSCLE SPASMS (Patient taking differently: as needed. TAKE 1 TABLET DAILY AS NEEDED FOR MUSCLE SPASMS) 90 tablet 1   Multiple Vitamins-Minerals (HAIR/SKIN/NAILS PO) Take 1 tablet by  mouth daily.     olmesartan  (BENICAR ) 20 MG tablet TAKE 1 TABLET DAILY 90 tablet 0   polycarbophil (FIBERCON) 625 MG tablet Take 1 tablet (625 mg total) by mouth 2 (two) times daily. 60 tablet 0   potassium chloride  SA (KLOR-CON  M) 20 MEQ tablet Take 1 tablet (20 mEq total) by mouth 2 (two) times daily for 5 days. 10 tablet 0   RESTASIS  0.05 % ophthalmic emulsion Place 1 drop into both eyes 2 (two) times daily.     semaglutide -weight management (WEGOVY ) 0.5 MG/0.5ML SOAJ SQ injection Inject 0.5 mg into the skin once a week. 6 mL 0   Zinc  50 MG CAPS Take by mouth.     No current facility-administered medications for this visit.    No Known Allergies  Family History  Problem Relation Age  of Onset   Hypertension Mother    Heart disease Mother        CAD   Breast cancer Mother 92   Arthritis Mother    Stroke Father    Hypertension Sister    Breast cancer Sister 81   Aneurysm Sister    Arthritis Brother    Heart attack Brother        COD   Hypertension Brother    Prostate cancer Brother 45   Heart disease Maternal Grandmother    Hypertension Daughter    Colon polyps Daughter    Leukemia Maternal Uncle 78   Leukemia Maternal Uncle 32   Breast cancer Cousin        dx under 50   Colon cancer Cousin        dx in her 54s   Colon cancer Cousin    Lymphoma Other 49       NHL   Esophageal cancer Neg Hx    Stomach cancer Neg Hx    Rectal cancer Neg Hx     Social History   Socioeconomic History   Marital status: Married    Spouse name: Homer   Number of children: 1   Years of education: 12   Highest education level: Not on file  Occupational History    Comment: retired  Tobacco Use   Smoking status: Never    Passive exposure: Past   Smokeless tobacco: Never  Vaping Use   Vaping status: Never Used  Substance and Sexual Activity   Alcohol use: No    Alcohol/week: 0.0 standard drinks of alcohol   Drug use: No   Sexual activity: Never  Other Topics Concern   Not on file  Social History Narrative   Consumes one glass of caffeine daily   Social Drivers of Health   Tobacco Use: Low Risk (09/14/2024)   Patient History    Smoking Tobacco Use: Never    Smokeless Tobacco Use: Never    Passive Exposure: Past  Financial Resource Strain: Low Risk (09/20/2023)   Overall Financial Resource Strain (CARDIA)    Difficulty of Paying Living Expenses: Not hard at all  Food Insecurity: No Food Insecurity (09/20/2023)   Hunger Vital Sign    Worried About Running Out of Food in the Last Year: Never true    Ran Out of Food in the Last Year: Never true  Transportation Needs: No Transportation Needs (09/20/2023)   PRAPARE - Administrator, Civil Service  (Medical): No    Lack of Transportation (Non-Medical): No  Physical Activity: Insufficiently Active (09/20/2023)   Exercise Vital Sign    Days of Exercise per  Week: 1 day    Minutes of Exercise per Session: 10 min  Stress: No Stress Concern Present (09/20/2023)   [employer] of Occupational Health - Occupational Stress Questionnaire    Feeling of Stress : Not at all  Social Connections: Moderately Integrated (09/20/2023)   Social Connection and Isolation Panel    Frequency of Communication with Friends and Family: More than three times a week    Frequency of Social Gatherings with Friends and Family: More than three times a week    Attends Religious Services: More than 4 times per year    Active Member of [employer] or Organizations: No    Attends Banker Meetings: Never    Marital Status: Married  Catering Manager Violence: Not At Risk (09/20/2023)   Humiliation, Afraid, Rape, and Kick questionnaire    Fear of Current or Ex-Partner: No    Emotionally Abused: No    Physically Abused: No    Sexually Abused: No  Depression (PHQ2-9): Low Risk (09/14/2024)   Depression (PHQ2-9)    PHQ-2 Score: 0  Alcohol Screen: Low Risk (09/03/2023)   Alcohol Screen    Last Alcohol Screening Score (AUDIT): 0  Housing: Low Risk (09/20/2023)   Housing    Last Housing Risk Score: 0  Utilities: Not At Risk (09/20/2023)   AHC Utilities    Threatened with loss of utilities: No  Health Literacy: Adequate Health Literacy (09/20/2023)   B1300 Health Literacy    Frequency of need for help with medical instructions: Never     Constitutional: Denies fever, malaise, fatigue, headache or abrupt weight changes.  HEENT: Denies eye pain, eye redness, ear pain, ringing in the ears, wax buildup, runny nose, nasal congestion, bloody nose, or sore throat. Respiratory: Denies difficulty breathing, shortness of breath, cough or sputum production.   Cardiovascular: Denies chest pain, chest tightness,  palpitations or swelling in the hands or feet.  Gastrointestinal: Denies abdominal pain, bloating, constipation, diarrhea or blood in the stool.  GU: Denies urgency, frequency, pain with urination, burning sensation, blood in urine, odor or discharge. Musculoskeletal: Patient reports chronic back/knee pain.  Denies decrease in range of motion, difficulty with gait, or joint swelling.  Skin: Patient reports dry skin.  Denies redness, rashes, lesions or ulcercations.  Neurological: Denies dizziness, difficulty with memory, difficulty with speech or problems with balance and coordination.  Psych: Denies anxiety, depression, SI/HI.  No other specific complaints in a complete review of systems (except as listed in HPI above).  Objective:   Physical Exam  BP 122/82 (BP Location: Left Arm, Patient Position: Sitting, Cuff Size: Large)   Ht 5' 6.5 (1.689 m)   Wt 222 lb (100.7 kg)   BMI 35.29 kg/m     Wt Readings from Last 3 Encounters:  09/14/24 222 lb 12.8 oz (101.1 kg)  08/24/24 227 lb 3.2 oz (103.1 kg)  03/21/24 253 lb 9.6 oz (115 kg)    General: Appears her stated age, obese in NAD. Skin: Warm, dry and intact.   HEENT: Head: normal shape and size; Eyes: sclera white, no icterus, conjunctiva pink, PERRLA and EOMs intact;  Cardiovascular: Normal rate and rhythm. S1,S2 noted.  No murmur, rubs or gallops noted. No JVD or BLE edema. No carotid bruits noted. Pulmonary/Chest: Normal effort and positive vesicular breath sounds. No respiratory distress. No wheezes, rales or ronchi noted.  Abdomen: Soft and nontender. Normal bowel sounds.  Musculoskeletal: Joint enlargement noted of bilateral knees.  No joint swelling noted. Gait slow and steady  without device Neurological: Alert and oriented. Cranial nerves II-XII grossly intact. Coordination normal.  Psychiatric: Mood and affect normal. Behavior is normal. Judgment and thought content normal.    BMET    Component Value Date/Time   NA 136  09/14/2024 1427   NA 138 08/22/2014 1237   K 4.3 09/14/2024 1427   K 3.8 08/22/2014 1237   CL 98 09/14/2024 1427   CO2 27 09/14/2024 1427   CO2 28 08/22/2014 1237   GLUCOSE 81 09/14/2024 1427   GLUCOSE 102 08/22/2014 1237   BUN 19 09/14/2024 1427   BUN 16.0 08/22/2014 1237   CREATININE 1.09 (H) 09/14/2024 1427   CREATININE 1.1 08/22/2014 1237   CALCIUM  9.1 09/14/2024 1427   CALCIUM  9.8 08/22/2014 1237   GFRNONAA 48 (L) 08/25/2024 1441   GFRAA 72 12/01/2007 1105    Lipid Panel     Component Value Date/Time   CHOL 137 03/21/2024 0927   TRIG 99 03/21/2024 0927   HDL 50 03/21/2024 0927   CHOLHDL 2.7 03/21/2024 0927   VLDL 29.8 07/02/2020 1009   LDLCALC 69 03/21/2024 0927    CBC    Component Value Date/Time   WBC 10.8 (H) 08/25/2024 1441   RBC 4.20 08/25/2024 1441   HGB 12.5 08/25/2024 1441   HGB 13.9 08/22/2014 1237   HCT 37.6 08/25/2024 1441   HCT 42.7 08/22/2014 1237   PLT 374 08/25/2024 1441   PLT 345 08/22/2014 1237   MCV 89.5 08/25/2024 1441   MCV 88.7 08/22/2014 1237   MCH 29.8 08/25/2024 1441   MCHC 33.2 08/25/2024 1441   RDW 13.2 08/25/2024 1441   RDW 13.8 08/22/2014 1237   LYMPHSABS 2.9 08/22/2014 1237   MONOABS 0.8 08/22/2014 1237   EOSABS 0.4 08/22/2014 1237   BASOSABS 0.1 08/22/2014 1237    Hgb A1C Lab Results  Component Value Date   HGBA1C 5.8 (H) 03/21/2024           Assessment & Plan:     RTC in 6 months for follow-up of chronic conditions Angeline Laura, NP

## 2024-09-21 NOTE — Telephone Encounter (Signed)
 Pharmacy Patient Advocate Encounter   Received notification from Onbase that prior authorization for Wegovy  0.5 is required/requested.   Insurance verification completed.   The patient is insured through [facility].   Per test claim: PA required; PA submitted to above mentioned insurance via Latent Key/confirmation #/EOC [id number] Status is pending

## 2024-09-21 NOTE — Patient Instructions (Signed)

## 2024-09-21 NOTE — Assessment & Plan Note (Addendum)
 Complicated by obesity Avoid foods that trigger reflux Encouraged weight loss as this can help reduce reflux symptoms Continue esomeprazole  40 mg daily, has failed previous wean in the past

## 2024-09-21 NOTE — Assessment & Plan Note (Addendum)
 Complicated by obesity Encourage regular stretching and core strengthening Encourage weight loss as this can help reduce joint pain Continue tylenol  500 mg 3 times daily as needed and methocarbamol  500 mg daily as needed

## 2024-09-21 NOTE — Assessment & Plan Note (Addendum)
 Complicated by obesity A1c today Encouraged her consume a low carb diet and exercise for weight loss

## 2024-09-21 NOTE — Assessment & Plan Note (Addendum)
 Complicated by obesity Encourage weight loss as this can reduce sleep apnea symptoms Noncompliant with CPAP

## 2024-09-21 NOTE — Assessment & Plan Note (Signed)
Continue yearly mammograms 

## 2024-09-21 NOTE — Assessment & Plan Note (Signed)
 Continue betamethasone  0.05% cream as needed

## 2024-09-21 NOTE — Assessment & Plan Note (Addendum)
 Complicated by obesity C-Met and lipid profile today Encouraged her to consume low-fat diet Continue atorvastatin  10 mg and aspirin 81 mg daily

## 2024-09-21 NOTE — Assessment & Plan Note (Signed)
 C-Met today Continue olmesartan  20 mg daily for renal protection

## 2024-09-21 NOTE — Assessment & Plan Note (Addendum)
 Complicated by obesity Encourage regular stretching and core strengthening Continue tylenol  500 mg 3 times daily as needed and methocarbamol  500 mg daily as needed

## 2024-09-21 NOTE — Assessment & Plan Note (Signed)
 Complicated by obesity C-Met and lipid profile today Encouraged her to consume low-fat diet Continue atorvastatin  10 mg daily

## 2024-09-21 NOTE — Assessment & Plan Note (Addendum)
 Complicated by obesity Controlled on olmesartan  20 mg Reinforced DASH diet and exercise for weight loss C-Met today

## 2024-09-21 NOTE — Assessment & Plan Note (Signed)
 Encourage diet and exercise for weight loss Continue semaglutide 

## 2024-09-22 ENCOUNTER — Ambulatory Visit: Payer: Self-pay | Admitting: Internal Medicine

## 2024-09-22 LAB — HEMOGLOBIN A1C
Hgb A1c MFr Bld: 5.3 % (ref <5.7)
Mean Plasma Glucose: 105 mg/dL
eAG (mmol/L): 5.8 mmol/L

## 2024-09-22 LAB — COMPREHENSIVE METABOLIC PANEL WITH GFR
AG Ratio: 1.3 (calc) (ref 1.0–2.5)
ALT: 18 U/L (ref 6–29)
AST: 17 U/L (ref 10–35)
Albumin: 4.1 g/dL (ref 3.6–5.1)
Alkaline phosphatase (APISO): 58 U/L (ref 37–153)
BUN/Creatinine Ratio: 15 (calc) (ref 6–22)
BUN: 15 mg/dL (ref 7–25)
CO2: 26 mmol/L (ref 20–32)
Calcium: 9.5 mg/dL (ref 8.6–10.4)
Chloride: 102 mmol/L (ref 98–110)
Creat: 1.02 mg/dL — ABNORMAL HIGH (ref 0.60–0.95)
Globulin: 3.2 g/dL (ref 1.9–3.7)
Glucose, Bld: 89 mg/dL (ref 65–99)
Potassium: 4.6 mmol/L (ref 3.5–5.3)
Sodium: 137 mmol/L (ref 135–146)
Total Bilirubin: 0.4 mg/dL (ref 0.2–1.2)
Total Protein: 7.3 g/dL (ref 6.1–8.1)
eGFR: 55 mL/min/1.73m2 — ABNORMAL LOW (ref >=60)

## 2024-09-22 LAB — LIPID PANEL
Cholesterol: 96 mg/dL (ref <200)
HDL: 45 mg/dL — ABNORMAL LOW (ref >=50)
LDL Cholesterol (Calc): 34 mg/dL
Non-HDL Cholesterol (Calc): 51 mg/dL (ref <130)
Total CHOL/HDL Ratio: 2.1 (calc) (ref <5.0)
Triglycerides: 88 mg/dL (ref <150)

## 2024-09-26 ENCOUNTER — Other Ambulatory Visit (HOSPITAL_COMMUNITY): Payer: Self-pay

## 2024-09-26 NOTE — Telephone Encounter (Signed)
 Pharmacy Patient Advocate Encounter  Received notification from [facility] that Prior Authorization for Wegovy  0.5  has been APPROVED from 09/21/24 to 09/21/25. Unable to obtain price due to refill too soon rejection, last fill date 09/17/24 next available fill date2/8/26   PA #/Case ID/Reference #: [id number]

## 2024-09-28 ENCOUNTER — Other Ambulatory Visit (HOSPITAL_COMMUNITY): Payer: Self-pay

## 2024-10-30 ENCOUNTER — Encounter: Payer: Self-pay | Admitting: Internal Medicine

## 2024-10-30 ENCOUNTER — Ambulatory Visit: Attending: Internal Medicine | Admitting: Internal Medicine

## 2024-10-30 VITALS — BP 159/82 | HR 89 | Temp 97.1°F | Resp 14 | Ht 66.5 in | Wt 219.6 lb

## 2024-10-30 DIAGNOSIS — G8929 Other chronic pain: Secondary | ICD-10-CM | POA: Diagnosis not present

## 2024-10-30 DIAGNOSIS — M5441 Lumbago with sciatica, right side: Secondary | ICD-10-CM | POA: Diagnosis not present

## 2024-10-30 MED ORDER — GABAPENTIN 100 MG PO CAPS: 100.0000 mg | ORAL_CAPSULE | Freq: Every day | ORAL | 0 refills | Status: AC

## 2024-10-30 NOTE — Assessment & Plan Note (Addendum)
 Degenerative disc disease of the lumbar spine with radiculopathy Significant L5-S1 degenerative disc disease with bone spurring and potential nerve impingement. No surgical indication due to absence of leg numbness or weakness. Previous physical therapy ineffective. - Prescribed gabapentin  for pain management, starting at a low dose 100 mg once daily. - Provided information on degenerative disc disease, lumbar radiculopathy, and gabapentin . - Consider referral to orthopedic or physiatry specialist if gabapentin  is ineffective or causes side effects. Orders:   gabapentin  (NEURONTIN ) 100 MG capsule; Take 1 capsule (100 mg total) by mouth at bedtime.

## 2025-02-05 ENCOUNTER — Ambulatory Visit: Admitting: Internal Medicine

## 2025-03-19 ENCOUNTER — Encounter: Admitting: Internal Medicine
